# Patient Record
Sex: Male | Born: 1975 | Race: White | Hispanic: No | Smoking: Never smoker
Health system: Southern US, Community
[De-identification: ages and names within clinical notes are randomized; demographics above are authoritative.]

## PROBLEM LIST (undated history)

## (undated) DIAGNOSIS — C61 Malignant neoplasm of prostate: Secondary | ICD-10-CM

## (undated) DIAGNOSIS — Z8619 Personal history of other infectious and parasitic diseases: Secondary | ICD-10-CM

## (undated) DIAGNOSIS — K219 Gastro-esophageal reflux disease without esophagitis: Secondary | ICD-10-CM

## (undated) DIAGNOSIS — I10 Essential (primary) hypertension: Secondary | ICD-10-CM

## (undated) DIAGNOSIS — K279 Peptic ulcer, site unspecified, unspecified as acute or chronic, without hemorrhage or perforation: Secondary | ICD-10-CM

## (undated) DIAGNOSIS — F419 Anxiety disorder, unspecified: Secondary | ICD-10-CM

## (undated) DIAGNOSIS — I340 Nonrheumatic mitral (valve) insufficiency: Secondary | ICD-10-CM

## (undated) DIAGNOSIS — R011 Cardiac murmur, unspecified: Secondary | ICD-10-CM

## (undated) DIAGNOSIS — K5900 Constipation, unspecified: Secondary | ICD-10-CM

## (undated) DIAGNOSIS — N200 Calculus of kidney: Secondary | ICD-10-CM

## (undated) HISTORY — PX: LITHOTRIPSY: SUR834

## (undated) HISTORY — DX: Gastro-esophageal reflux disease without esophagitis: K21.9

## (undated) HISTORY — DX: Essential (primary) hypertension: I10

## (undated) HISTORY — DX: Cardiac murmur, unspecified: R01.1

## (undated) HISTORY — DX: Calculus of kidney: N20.0

## (undated) HISTORY — DX: Constipation, unspecified: K59.00

## (undated) HISTORY — DX: Anxiety disorder, unspecified: F41.9

## (undated) HISTORY — PX: HERNIA REPAIR: SHX51

## (undated) HISTORY — DX: Personal history of other infectious and parasitic diseases: Z86.19

## (undated) HISTORY — DX: Peptic ulcer, site unspecified, unspecified as acute or chronic, without hemorrhage or perforation: K27.9

---

## 2006-07-03 ENCOUNTER — Emergency Department (HOSPITAL_COMMUNITY): Admission: EM | Admit: 2006-07-03 | Discharge: 2006-07-03 | Payer: Self-pay | Admitting: Family Medicine

## 2008-01-28 ENCOUNTER — Other Ambulatory Visit: Payer: Self-pay

## 2008-01-28 ENCOUNTER — Emergency Department: Payer: Self-pay | Admitting: Emergency Medicine

## 2008-02-15 ENCOUNTER — Ambulatory Visit: Payer: Self-pay | Admitting: Anesthesiology

## 2008-03-22 ENCOUNTER — Ambulatory Visit: Payer: Self-pay | Admitting: Anesthesiology

## 2008-06-06 ENCOUNTER — Ambulatory Visit: Payer: Self-pay | Admitting: Anesthesiology

## 2009-09-01 ENCOUNTER — Emergency Department: Payer: Self-pay | Admitting: Emergency Medicine

## 2010-07-29 ENCOUNTER — Encounter: Payer: Self-pay | Admitting: Specialist

## 2011-01-25 ENCOUNTER — Emergency Department: Payer: Self-pay | Admitting: Emergency Medicine

## 2011-03-16 ENCOUNTER — Emergency Department: Payer: Self-pay | Admitting: Unknown Physician Specialty

## 2011-04-09 ENCOUNTER — Emergency Department: Payer: Self-pay | Admitting: Emergency Medicine

## 2011-04-10 ENCOUNTER — Emergency Department: Payer: Self-pay

## 2011-05-15 ENCOUNTER — Emergency Department: Payer: Self-pay

## 2011-10-20 ENCOUNTER — Emergency Department (HOSPITAL_COMMUNITY)
Admission: EM | Admit: 2011-10-20 | Discharge: 2011-10-20 | Disposition: A | Discharge status: Home / Self Care | Payer: Self-pay | Attending: Emergency Medicine | Admitting: Emergency Medicine

## 2011-10-20 ENCOUNTER — Emergency Department: Payer: Self-pay | Admitting: Emergency Medicine

## 2011-10-20 DIAGNOSIS — L03317 Cellulitis of buttock: Secondary | ICD-10-CM | POA: Insufficient documentation

## 2011-10-20 DIAGNOSIS — L0231 Cutaneous abscess of buttock: Secondary | ICD-10-CM | POA: Insufficient documentation

## 2011-10-20 DIAGNOSIS — Z8614 Personal history of Methicillin resistant Staphylococcus aureus infection: Secondary | ICD-10-CM | POA: Insufficient documentation
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
## 2013-07-26 ENCOUNTER — Encounter: Payer: Self-pay | Admitting: Gastroenterology

## 2013-08-20 ENCOUNTER — Ambulatory Visit (INDEPENDENT_AMBULATORY_CARE_PROVIDER_SITE_OTHER): Payer: 59 | Admitting: Gastroenterology

## 2013-08-20 ENCOUNTER — Other Ambulatory Visit (INDEPENDENT_AMBULATORY_CARE_PROVIDER_SITE_OTHER): Payer: 59

## 2013-08-20 ENCOUNTER — Encounter: Payer: Self-pay | Admitting: Gastroenterology

## 2013-08-20 VITALS — BP 148/86 | HR 72 | Ht 66.0 in | Wt 194.0 lb

## 2013-08-20 DIAGNOSIS — K5909 Other constipation: Secondary | ICD-10-CM | POA: Insufficient documentation

## 2013-08-20 DIAGNOSIS — K5900 Constipation, unspecified: Secondary | ICD-10-CM

## 2013-08-20 LAB — TSH: TSH: 0.56 u[IU]/mL (ref 0.35–5.50)

## 2013-08-20 LAB — T4: T4 TOTAL: 9.1 ug/dL (ref 5.0–12.5)

## 2013-08-20 MED ORDER — PEG-KCL-NACL-NASULF-NA ASC-C 100 G PO SOLR: 1.0000 | Freq: Once | ORAL | Status: DC

## 2013-08-20 NOTE — Addendum Note (Signed)
Addended by: Oda Kilts on: 08/20/2013 03:32 PM   Modules accepted: Orders

## 2013-08-20 NOTE — Progress Notes (Signed)
    _                                                                                                                History of Present Illness: 38 year old white male referred for evaluation of constipation.  Until 6 months ago he was moving his bowels every other day.  He now will not have a spontaneous bowel movement for 2-3 weeks at a time.  He is taking various laxatives with only a partial response.  He complains of fullness and early satiety.  At the same time he's gained over 20 pounds in the past 4 months.  There is no history of rectal bleeding.  He believes stools have become flat. He recalls getting laxatives as a child although he denies having constipation.  Energy levels have not changed.  He is otherwise feeling well.    Past Medical History  Diagnosis Date  . Kidney stones   . Hypertension    Past Surgical History  Procedure Laterality Date  . Hernia repair      x 2   . Lithotripsy     family history is not on file. Current Outpatient Prescriptions  Medication Sig Dispense Refill  . glycopyrrolate (ROBINUL) 1 MG tablet Take 1 mg by mouth 3 (three) times daily.       No current facility-administered medications for this visit.   Allergies as of 08/20/2013  . (No Known Allergies)    reports that he has never smoked. He uses smokeless tobacco. He reports that he does not drink alcohol or use illicit drugs.     Review of Systems: Pertinent positive and negative review of systems were noted in the above HPI section. All other review of systems were otherwise negative.  Vital signs were reviewed in today's medical record Physical Exam: General: Well developed , well nourished, no acute distress Skin: anicteric Head: Normocephalic and atraumatic Eyes:  sclerae anicteric, EOMI Ears: Normal auditory acuity Mouth: No deformity or lesions Neck: Supple, no masses or thyromegaly Lungs: Clear throughout to auscultation Heart: Regular rate and rhythm; no  murmurs, rubs or bruits Abdomen: Soft, non tender and non distended. No masses, hepatosplenomegaly or hernias noted. Normal Bowel sounds Rectal:deferred Musculoskeletal: Symmetrical with no gross deformities  Skin: No lesions on visible extremities Pulses:  Normal pulses noted Extremities: No clubbing, cyanosis, edema or deformities noted Neurological: Alert oriented x 4, grossly nonfocal Cervical Nodes:  No significant cervical adenopathy Inguinal Nodes: No significant inguinal adenopathy Psychological:  Alert and cooperative. Normal mood and affect  See Assessment and Plan under Problem List

## 2013-08-20 NOTE — Assessment & Plan Note (Signed)
The patient has new onset of severe constipation and concurrent weight gain.  Hypothyroidism should be ruled out.  A structural abnormality of the colon is also a concern.  At the same time, he does relate a history of having received laxatives as a child raiseing the question of lifelong idiopathic constipation.  Recommendations #1 check thyroid function tests #2 colonoscopy if #1 is normal #3 provided numbers one and 2 are unremarkable she will be referred for a constipation trial placed on a bowel regimen beginning with Linzess.

## 2013-08-20 NOTE — Patient Instructions (Signed)
Go to the basement for labs Use 1 bottle of mag Citrait and 2 dulcolax pills tonight if no results in 4 hours repeat steps  You have been scheduled for a colonoscopy with propofol. Please follow written instructions given to you at your visit today.  Please pick up your prep kit at the pharmacy within the next 1-3 days. If you use inhalers (even only as needed), please bring them with you on the day of your procedure. Your physician has requested that you go to www.startemmi.com and enter the access code given to you at your visit today. This web site gives a general overview about your procedure. However, you should still follow specific instructions given to you by our office regarding your preparation for the procedure.

## 2013-08-23 NOTE — Progress Notes (Signed)
Quick Note:  Please inform the patient that lab work was normal and to continue current plan of action ______ 

## 2013-08-24 ENCOUNTER — Encounter: Payer: Self-pay | Admitting: Gastroenterology

## 2013-09-10 ENCOUNTER — Ambulatory Visit (AMBULATORY_SURGERY_CENTER): Payer: 59 | Admitting: Gastroenterology

## 2013-09-10 ENCOUNTER — Encounter: Payer: Self-pay | Admitting: Gastroenterology

## 2013-09-10 VITALS — BP 133/84 | HR 74 | Temp 98.3°F | Resp 14 | Ht 66.0 in | Wt 194.0 lb

## 2013-09-10 DIAGNOSIS — K5900 Constipation, unspecified: Secondary | ICD-10-CM

## 2013-09-10 MED ORDER — SODIUM CHLORIDE 0.9 % IV SOLN: 500.0000 mL | INTRAVENOUS | Status: DC

## 2013-09-10 NOTE — Patient Instructions (Signed)
YOU HAD AN ENDOSCOPIC PROCEDURE TODAY AT THE [facility]: Refer to the procedure report that was given to you for any specific questions about what was found during the examination.  If the procedure report does not answer your questions, please call your gastroenterologist to clarify.  If you requested that your care partner not be given the details of your procedure findings, then the procedure report has been included in a sealed envelope for you to review at your convenience later.  YOU SHOULD EXPECT: Some feelings of bloating in the abdomen. Passage of more gas than usual.  Walking can help get rid of the air that was put into your GI tract during the procedure and reduce the bloating. If you had a lower endoscopy (such as a colonoscopy or flexible sigmoidoscopy) you may notice spotting of blood in your stool or on the toilet paper. If you underwent a bowel prep for your procedure, then you may not have a normal bowel movement for a few days.  DIET: Your first meal following the procedure should be a light meal and then it is ok to progress to your normal diet.  A half-sandwich or bowl of soup is an example of a good first meal.  Heavy or fried foods are harder to digest and may make you feel nauseous or bloated.  Likewise meals heavy in dairy and vegetables can cause extra gas to form and this can also increase the bloating.  Drink plenty of fluids but you should avoid alcoholic beverages for 24 hours.  ACTIVITY: Your care partner should take you home directly after the procedure.  You should plan to take it easy, moving slowly for the rest of the day.  You can resume normal activity the day after the procedure however you should NOT DRIVE or use heavy machinery for 24 hours (because of the sedation medicines used during the test).    SYMPTOMS TO REPORT IMMEDIATELY: A gastroenterologist can be reached at any hour.  During normal business hours, 8:30 AM to 5:00 PM Monday through Friday,  call [phone].  After hours and on weekends, please call the GI answering service at [phone] who will take a message and have the physician on call contact you.   Following lower endoscopy (colonoscopy or flexible sigmoidoscopy):  Excessive amounts of blood in the stool  Significant tenderness or worsening of abdominal pains  Swelling of the abdomen that is new, acute  Fever of 100F or higher  FOLLOW UP: If any biopsies were taken you will be contacted by phone or by letter within the next 1-3 weeks.  Call your gastroenterologist if you have not heard about the biopsies in 3 weeks.  Our staff will call the home number listed on your records the next business day following your procedure to check on you and address any questions or concerns that you may have at that time regarding the information given to you following your procedure. This is a courtesy call and so if there is no answer at the home number and we have not heard from you through the emergency physician on call, we will assume that you have returned to your regular daily activities without incident.  SIGNATURES/CONFIDENTIALITY: You and/or your care partner have signed paperwork which will be entered into your electronic medical record.  These signatures attest to the fact that that the information above on your After Visit Summary has been reviewed and is understood.  Full responsibility of the confidentiality of this   discharge information lies with you and/or your care-partner.  Recommendations See procedure report  

## 2013-09-10 NOTE — Progress Notes (Signed)
1606 patient in PACU, report given to pacu RN, vss, bbs=clear

## 2013-09-10 NOTE — Op Note (Signed)
[facility]  [address] [place], [address]   COLONOSCOPY PROCEDURE REPORT  PATIENT: Stephen Avila, Stephen Avila  MR#: [id number] BIRTHDATE: 1976/02/12 , 38  yrs. old GENDER: Male ENDOSCOPIST: Inda Castle, MD REFERRED DE:YCXK Hilma Favors, M.D. PROCEDURE DATE:  09/10/2013 PROCEDURE:   Colonoscopy, diagnostic First Screening Colonoscopy - Avg.  risk and is 50 yrs.  old or older Yes.  Prior Negative Screening - Now for repeat screening. N/A  History of Adenoma - Now for follow-up colonoscopy & has been > or = to 3 yrs.  N/A  Polyps Removed Today? No.  Recommend repeat exam, <10 yrs? No. ASA CLASS:   Class II INDICATIONS:Constipation. MEDICATIONS: MAC sedation, administered by CRNA and propofol (Diprivan) 350mg  IV  DESCRIPTION OF PROCEDURE:   After the risks benefits and alternatives of the procedure were thoroughly explained, informed consent was obtained.  A digital rectal exam revealed no abnormalities of the rectum.   The LB [id number]  endoscope was introduced through the anus and advanced to the cecum, which was identified by both the appendix and ileocecal valve. No adverse events experienced.   The quality of the prep was Suprep fair  The instrument was then slowly withdrawn as the colon was fully examined.      COLON FINDINGS: A normal appearing cecum, ileocecal valve, and appendiceal orifice were identified.  The ascending, hepatic flexure, transverse, splenic flexure, descending, sigmoid colon and rectum appeared unremarkable.  No polyps or cancers were seen. Retroflexed views revealed no abnormalities. The time to cecum=6 minutes 02 seconds.  Withdrawal time=9 minutes 45 seconds.  The scope was withdrawn and the procedure completed. COMPLICATIONS: There were no complications.  ENDOSCOPIC IMPRESSION: Normal colon  RECOMMENDATIONS: patient will consider enrollment in a chronic idiopathic constipation trial  eSigned:  Inda Castle, MD  09/10/2013 4:08 PM   cc:   PATIENT NAME:  Stephen Avila, Stephen Avila MR#: [id number]

## 2013-09-10 NOTE — Progress Notes (Signed)
Patient ID: Stephen Avila, male   DOB: July 20, 1975, 38 y.o.   MRN: [id number]

## 2013-09-13 ENCOUNTER — Telehealth: Payer: Self-pay

## 2013-09-13 NOTE — Telephone Encounter (Signed)
Left a message for the pt to call us if he has any questions or concerns. maw

## 2013-10-12 ENCOUNTER — Other Ambulatory Visit (HOSPITAL_COMMUNITY): Payer: Self-pay | Admitting: Family Medicine

## 2013-10-12 DIAGNOSIS — R030 Elevated blood-pressure reading, without diagnosis of hypertension: Secondary | ICD-10-CM

## 2013-10-14 ENCOUNTER — Other Ambulatory Visit (HOSPITAL_COMMUNITY): Payer: 59

## 2013-10-15 ENCOUNTER — Ambulatory Visit (HOSPITAL_COMMUNITY)
Admission: RE | Admit: 2013-10-15 | Discharge: 2013-10-15 | Disposition: A | Discharge status: Home / Self Care | Payer: 59 | Source: Ambulatory Visit | Attending: Family Medicine | Admitting: Family Medicine

## 2013-10-15 DIAGNOSIS — N200 Calculus of kidney: Secondary | ICD-10-CM | POA: Insufficient documentation

## 2013-10-15 DIAGNOSIS — R141 Gas pain: Secondary | ICD-10-CM | POA: Insufficient documentation

## 2013-10-15 DIAGNOSIS — R143 Flatulence: Secondary | ICD-10-CM

## 2013-10-15 DIAGNOSIS — N1330 Unspecified hydronephrosis: Secondary | ICD-10-CM | POA: Insufficient documentation

## 2013-10-15 DIAGNOSIS — M47817 Spondylosis without myelopathy or radiculopathy, lumbosacral region: Secondary | ICD-10-CM | POA: Insufficient documentation

## 2013-10-15 DIAGNOSIS — R1084 Generalized abdominal pain: Secondary | ICD-10-CM | POA: Insufficient documentation

## 2013-10-15 DIAGNOSIS — R142 Eructation: Secondary | ICD-10-CM | POA: Insufficient documentation

## 2013-10-15 DIAGNOSIS — R030 Elevated blood-pressure reading, without diagnosis of hypertension: Secondary | ICD-10-CM

## 2013-10-15 MED ORDER — IOHEXOL 300 MG/ML  SOLN
100.0000 mL | Freq: Once | INTRAMUSCULAR | Status: AC | PRN
  Administered 2013-10-15: 100 mL via INTRAVENOUS

## 2013-12-09 ENCOUNTER — Ambulatory Visit: Payer: Self-pay | Admitting: Urology

## 2013-12-09 DIAGNOSIS — Z87442 Personal history of urinary calculi: Secondary | ICD-10-CM | POA: Insufficient documentation

## 2013-12-09 DIAGNOSIS — N200 Calculus of kidney: Secondary | ICD-10-CM | POA: Insufficient documentation

## 2013-12-29 ENCOUNTER — Ambulatory Visit: Payer: Self-pay | Admitting: Urology

## 2013-12-31 LAB — URINE CULTURE

## 2014-01-05 ENCOUNTER — Ambulatory Visit: Payer: Self-pay | Admitting: Urology

## 2014-01-10 ENCOUNTER — Ambulatory Visit: Payer: Self-pay | Admitting: Urology

## 2014-01-11 ENCOUNTER — Telehealth: Payer: Self-pay

## 2014-01-11 ENCOUNTER — Other Ambulatory Visit: Payer: Self-pay

## 2014-01-11 MED ORDER — LINACLOTIDE 145 MCG PO CAPS: 145.0000 ug | ORAL_CAPSULE | Freq: Every day | ORAL | Status: DC

## 2014-01-11 NOTE — Telephone Encounter (Signed)
Script sent to pharmacy. Pt scheduled to see Dr. Deatra Ina 03/17/14@9 :30am. Pt mailed appt letter.

## 2014-01-11 NOTE — Telephone Encounter (Signed)
Message copied by Algernon Huxley on Tue Jan 11, 2014  3:17 PM ------      Message from: Erskine Emery D      Created: Tue Jan 11, 2014  9:28 AM      Regarding: FW: Miley,Stella       Begin LInzess 145ug qd      OV 2 months      ----- Message -----         From: Maury Dus, RN         Sent: 01/10/2014   2:48 PM           To: Inda Castle, MD      Subject: Melton AlarVanetta Shawl                                                   ----- Message -----         From: Orlie Pollen         Sent: 01/10/2014   2:35 PM           To: Maury Dus, RN      Subject: geovany, trudo                                           The above subject has completed the Linaclotide study. Please advise to a regimen treatment for his constipation.       ------

## 2014-01-20 ENCOUNTER — Ambulatory Visit: Payer: Self-pay | Admitting: Urology

## 2014-01-24 ENCOUNTER — Encounter: Payer: Self-pay | Admitting: *Deleted

## 2014-01-24 ENCOUNTER — Ambulatory Visit: Payer: Self-pay | Admitting: Urology

## 2014-01-24 ENCOUNTER — Telehealth: Payer: Self-pay | Admitting: Gastroenterology

## 2014-01-24 NOTE — Telephone Encounter (Signed)
Medication approved till 01/30/2024

## 2014-03-17 ENCOUNTER — Ambulatory Visit: Payer: 59 | Admitting: Gastroenterology

## 2014-10-29 NOTE — Op Note (Signed)
PATIENT NAME:  Stephen Avila, Stephen Avila MR#:  [id number] DATE OF BIRTH:  [birth year]  DATE OF PROCEDURE:  01/05/2014  PREOPERATIVE DIAGNOSIS: Right renal pelvic calculus.   POSTOPERATIVE DIAGNOSIS: Right renal pelvic calculus.   PROCEDURES:  1.  Right ureteropyeloscopy with Holmium laser lithotripsy.  2.  Placement right ureteral stent.   SURGEON: Scott C. Bernardo Heater, M.D.   ASSISTANT: None.   ANESTHESIA: General.   INDICATION: The patient is a 39 year old male with a history of recurrent stone disease. He recently developed right flank pain and CT was remarkable for a 19-mm right renal pelvic calculus. After discussion of treatment options, he has elected ureteroscopy with laser lithotripsy.   DESCRIPTION OF PROCEDURE:  The patient was seen in the staging area and the site was marked. He was taken to the cystoscopy suite and placed on the table in supine position where a general anesthetic was administered via an endotracheal tube. He was then placed in the low lithotomy position and his external genitalia were prepped and draped in the usual fashion. A  timeout was performed per protocol. The meatus would not accept a 21-French cystoscope and was dilated with [employer] sounds from 20 to 24 [place]. A 21-French lubricated cystoscope was then passed under direct vision. The urethra was normal in caliber without stricture. The prostate was nonocclusive. Bladder mucosa was inspected and there was no erythema, no solid or papillary lesions. The ureteral orifices were normal-appearing bilaterally.   A 0.035 PTFE guidewire was placed through the cystoscope into the right ureteral orifice. Wire was easily passed up into the renal pelvis under fluoroscopic guidance. The renal pelvic calculus was easily visualized on fluoroscopy. Cystoscope was removed and repassed. A second 0.737 hydrophilic guidewire was placed in a similar fashion into the right ureter. The cystoscope was removed. An AUR-8 flexible digital  ureteroscope was then placed over the hydrophilic guidewire, however, the scope could not be advanced up the distal ureter. The ureteroscope was removed. A long ureteral access sheath placed over the wire and placed into the distal ureter without difficulty. The inner sheath and hydrophilic guidewire were removed.   The 8-French flexible cystoscope was then placed through the access sheath and easily passed up the ureter to the renal pelvis. No ureteral abnormalities or calculi were seen. The stone was visualized in the renal pelvis and was crystalline-appearing. A 225-micron Holmium laser fiber was placed through the ureteroscope. The stone was then dusted into multiple minute fragments. Based on the size of the stone this portion of the procedure took some time.   Ureteroscopy was then performed. There were a few larger fragments which were removed with a 3-French Nitinol basket. The remaining small fragments were felt to be small enough to pass. No large fragments were seen on fluoroscopy. Retrograde pyelogram was performed. There was no extravasation and no larger fragments were visualized. The hydrophilic guidewire was replaced into the access sheath and the sheath was removed. A 6-French/24-cm Contour ureteral stent was placed. There was good curl seen in the renal pelvis. The cystoscope was repassed and the distal end of the stent was well positioned in the bladder. The safety wire was removed. The bladder was emptied and the cystoscope was removed. A B and O suppository was placed per rectum. The patient was taken to PACU in stable condition. There were no complications.   ESTIMATED BLOOD LOSS: Minimal.   ____________________________ Ronda Fairly. Bernardo Heater, MD scs:lt D: 01/05/2014 09:39:03 ET T: 01/05/2014 12:18:18 ET JOB#: [id number]  cc: Nicki Reaper  Mariana Arn, MD, <Dictator> Abbie Sons MD ELECTRONICALLY SIGNED 01/19/2014 22:16

## 2015-06-10 ENCOUNTER — Emergency Department: Payer: 59

## 2015-06-10 ENCOUNTER — Emergency Department
Admission: EM | Admit: 2015-06-10 | Discharge: 2015-06-11 | Disposition: A | Discharge status: Home / Self Care | Payer: 59 | Attending: Emergency Medicine | Admitting: Emergency Medicine

## 2015-06-10 DIAGNOSIS — R109 Unspecified abdominal pain: Secondary | ICD-10-CM

## 2015-06-10 DIAGNOSIS — Z79899 Other long term (current) drug therapy: Secondary | ICD-10-CM | POA: Diagnosis not present

## 2015-06-10 DIAGNOSIS — R1011 Right upper quadrant pain: Secondary | ICD-10-CM | POA: Diagnosis not present

## 2015-06-10 DIAGNOSIS — I10 Essential (primary) hypertension: Secondary | ICD-10-CM | POA: Diagnosis not present

## 2015-06-10 DIAGNOSIS — R1013 Epigastric pain: Secondary | ICD-10-CM | POA: Diagnosis not present

## 2015-06-10 LAB — COMPREHENSIVE METABOLIC PANEL
ALBUMIN: 4.5 g/dL (ref 3.5–5.0)
ALT: 26 U/L (ref 17–63)
AST: 24 U/L (ref 15–41)
Alkaline Phosphatase: 70 U/L (ref 38–126)
Anion gap: 9 (ref 5–15)
BUN: 16 mg/dL (ref 6–20)
CHLORIDE: 102 mmol/L (ref 101–111)
CO2: 30 mmol/L (ref 22–32)
Calcium: 9.7 mg/dL (ref 8.9–10.3)
Creatinine, Ser: 1.16 mg/dL (ref 0.61–1.24)
GFR calc Af Amer: >60 mL/min (ref >60)
GFR calc non Af Amer: >60 mL/min (ref >60)
GLUCOSE: 115 mg/dL — AB (ref 65–99)
POTASSIUM: 3.8 mmol/L (ref 3.5–5.1)
SODIUM: 141 mmol/L (ref 135–145)
Total Bilirubin: 0.1 mg/dL — ABNORMAL LOW (ref 0.3–1.2)
Total Protein: 8.1 g/dL (ref 6.5–8.1)

## 2015-06-10 LAB — CBC
HEMATOCRIT: 51.2 % (ref 40.0–52.0)
Hemoglobin: 17 g/dL (ref 13.0–18.0)
MCH: 28.9 pg (ref 26.0–34.0)
MCHC: 33.1 g/dL (ref 32.0–36.0)
MCV: 87.4 fL (ref 80.0–100.0)
Platelets: 316 10*3/uL (ref 150–440)
RBC: 5.86 MIL/uL (ref 4.40–5.90)
RDW: 13.1 % (ref 11.5–14.5)
WBC: 10 10*3/uL (ref 3.8–10.6)

## 2015-06-10 LAB — URINALYSIS COMPLETE WITH MICROSCOPIC (ARMC ONLY)
BACTERIA UA: NONE SEEN
Bilirubin Urine: NEGATIVE
GLUCOSE, UA: 50 mg/dL — AB
Ketones, ur: NEGATIVE mg/dL
LEUKOCYTES UA: NEGATIVE
Nitrite: NEGATIVE
PROTEIN: NEGATIVE mg/dL
SQUAMOUS EPITHELIAL / LPF: NONE SEEN
Specific Gravity, Urine: 1.013 (ref 1.005–1.030)
pH: 7 (ref 5.0–8.0)

## 2015-06-10 LAB — LIPASE, BLOOD: LIPASE: 38 U/L (ref 11–51)

## 2015-06-10 MED ORDER — ONDANSETRON HCL 4 MG PO TABS: 4.0000 mg | ORAL_TABLET | Freq: Three times a day (TID) | ORAL | Status: DC | PRN

## 2015-06-10 MED ORDER — SODIUM CHLORIDE 0.9 % IV BOLUS (SEPSIS)
1000.0000 mL | Freq: Once | INTRAVENOUS | Status: AC
  Administered 2015-06-10: 1000 mL via INTRAVENOUS

## 2015-06-10 MED ORDER — OXYCODONE-ACETAMINOPHEN 5-325 MG PO TABS: 1.0000 | ORAL_TABLET | Freq: Four times a day (QID) | ORAL | Status: DC | PRN

## 2015-06-10 NOTE — ED Notes (Signed)
Patient transported to CT 

## 2015-06-10 NOTE — ED Notes (Signed)
Awaiting room at this time.

## 2015-06-10 NOTE — Discharge Instructions (Signed)
You would prefer not to be admitted to the hospital at this time which is not unreasonable however if you have increased pain, fever, chest pain, shortness of breath, vomiting or you feel worse in any way please return to the emergency department. It is likely you will need to have her gallbladder out, however, that does not need to happen necessarily today but if you feel worse you do need to return to the emergency department. Otherwise, follow closely with the referral surgeon as an outpatient

## 2015-06-10 NOTE — ED Provider Notes (Addendum)
[facility] Emergency Department Provider Note  ____________________________________________   I have reviewed the triage vital signs and the nursing notes.   HISTORY  Chief Complaint Abdominal Pain    HPI Stephen Avila is a 39 y.o. male with a history of kidney stones, lithotripsy last year presents a with right upper quadrant pain that radiates to the right flank. His been there for one week. No nausea no vomiting eating well no diarrhea, feels constipated. No fever no chills no hematuria no dysuria no headache no cough no chest pain or shortness of breath. The pain is in the right side of his abdomen to the right back per his kidney stones used to be. Patient has no chest pain or shortness of breath or exertional symptoms no numbness or weakness. Pain is colicky, comes and goes,  Past Medical History  Diagnosis Date  . Kidney stones   . Hypertension   . Anxiety   . GERD (gastroesophageal reflux disease)   . Constipation   . Kidney stones     Patient Active Problem List   Diagnosis Date Noted  . Unspecified constipation 08/20/2013    Past Surgical History  Procedure Laterality Date  . Hernia repair      x 2   . Lithotripsy      Current Outpatient Rx  Name  Route  Sig  Dispense  Refill  . glycopyrrolate (ROBINUL) 1 MG tablet   Oral   Take 1 mg by mouth 3 (three) times daily.         Marland Kitchen Linaclotide (LINZESS) 145 MCG CAPS capsule   Oral   Take 1 capsule (145 mcg total) by mouth daily.   30 capsule   2     Allergies Review of patient's allergies indicates no known allergies.  No family history on file.  Social History Social History  Substance Use Topics  . Smoking status: Never Smoker   . Smokeless tobacco: Current User    Types: Snuff  . Alcohol Use: No    Review of Systems Constitutional: No fever/chills Eyes: No visual changes. ENT: No sore throat. No stiff neck no neck pain Cardiovascular: Denies chest  pain. Respiratory: Denies shortness of breath. Gastrointestinal:   no vomiting.  No diarrhea.  No constipation. Genitourinary: Negative for dysuria. Musculoskeletal: Negative lower extremity swelling Skin: Negative for rash. Neurological: Negative for headaches, focal weakness or numbness. 10-point ROS otherwise negative.  ____________________________________________   PHYSICAL EXAM:  VITAL SIGNS: ED Triage Vitals  Enc Vitals Group     BP 06/10/15 1547 136/101 mmHg     Pulse Rate 06/10/15 1547 122     Resp 06/10/15 1547 18     Temp 06/10/15 1547 97.8 F (36.6 C)     Temp Source 06/10/15 1547 Oral     SpO2 06/10/15 1547 98 %     Weight 06/10/15 1547 210 lb (95.255 kg)     Height 06/10/15 1547 5\' 6"  (1.676 m)     Head Cir --      Peak Flow --      Pain Score 06/10/15 1548 7     Pain Loc --      Pain Edu? --      Excl. in [place]? --     Constitutional: Alert and oriented. Well appearing and in no acute distress. Eyes: Conjunctivae are normal. PERRL. EOMI. Head: Atraumatic. Nose: No congestion/rhinnorhea. Mouth/Throat: Mucous membranes are moist.  Oropharynx non-erythematous. Neck: No stridor.   Nontender with no  meningismus Cardiovascular: Normal rate, regular rhythm. Grossly normal heart sounds.  Good peripheral circulation. Respiratory: Normal respiratory effort.  No retractions. Lungs CTAB. Abdominal: Minimal tenderness to palpation in the epigastric right upper quadrant region which does reduce the patient's pain. No distention. No guarding no rebound Back:  There is no focal tenderness or step off there is no midline tenderness there are no lesions noted. there is right CVA tenderness Musculoskeletal: No lower extremity tenderness. No joint effusions, no DVT signs strong distal pulses no edema Neurologic:  Normal speech and language. No gross focal neurologic deficits are appreciated.  Skin:  Skin is warm, dry and intact. No rash noted. Psychiatric: Mood and affect are  normal. Speech and behavior are normal.  ____________________________________________   LABS (all labs ordered are listed, but only abnormal results are displayed)  Labs Reviewed  COMPREHENSIVE METABOLIC PANEL - Abnormal; Notable for the following:    Glucose, Bld 115 (*)    Total Bilirubin 0.1 (*)    All other components within normal limits  URINALYSIS COMPLETEWITH MICROSCOPIC ([facility] ONLY) - Abnormal; Notable for the following:    Color, Urine YELLOW (*)    APPearance CLEAR (*)    Glucose, UA 50 (*)    Hgb urine dipstick 1+ (*)    All other components within normal limits  LIPASE, BLOOD  CBC   ____________________________________________  EKG  I personally interpreted any EKGs ordered by me or triage Sinus tach rate 107 acute ST elevation or acute ST depression, no significant change from prior in 2009 ____________________________________________  RADIOLOGY  I reviewed any imaging ordered by me or triage that were performed during my shift ____________________________________________   PROCEDURES  Procedure(s) performed: None  Critical Care performed: None  ____________________________________________   INITIAL IMPRESSION / ASSESSMENT AND PLAN / ED COURSE  Pertinent labs & imaging results that were available during my care of the patient were reviewed by me and considered in my medical decision making (see chart for details).  Patient with epigastric and right flank pain, history of kidney stones hypertension anxiety GERD constipation and lithotripsy. Nothing to suggest referred intrathoracic pathology for this pain. Does not seem consistent with CAD. Lipase is normal electrolytes are normal kidney function is normal liver function tests are reassuring CBC is reassuring urinalysis is reassuring exam is reassuring. Given his history of kidney stones and the fact that he has flank pain I did a CT scan of his abdomen and pelvis which was unremarkable. I will now obtain  an ultrasound of his right upper quadrant as a precaution is asked where the pain seems to localize however there is evidence of significant constipation to CT and that certainly can cause this colicky pain that he is describing.   ----------------------------------------- 8:51 PM on 06/10/2015 -----------------------------------------  Patient has evidence of biliary colic but no evidence of cholecystitis. This is consistent with exam and history.  ----------------------------------------- 10:01 PM on 06/10/2015 -----------------------------------------  Patient resting comfortably in the bed. Heart rate is 94 when he is alone but when I enter the room wearing a white coat, his heart rate goes up to 110 to 115. He states he is "been panicking" thing about what might be wrong with him. He is very reassured by our findings as MI. I have offered him a surgical consult but he prefers to decline that at this time. He has no abdominal pain at this time and serial abdominal exams are negative for significant tenderness. Patient has been explained that he likely ultimately  will have to have his gallbladder out given his symptom I'll G and his findings. The patient states that he would prefer to do this as an outpatient and go home. He understands return precautions and follow-up. Patient declines admission or further intervention. We will ensure that he can tolerate by mouth prior to discharge.  ____________________________________________   FINAL CLINICAL IMPRESSION(S) / ED DIAGNOSES  Final diagnoses:  Right flank pain  Abdominal pain     Schuyler Amor, MD 06/10/15 1947  Schuyler Amor, MD 06/10/15 2050  Schuyler Amor, MD 06/10/15 [id number]  Schuyler Amor, MD 06/10/15 2202

## 2015-06-10 NOTE — ED Notes (Signed)
Lab results reviewed and WNL.

## 2015-06-10 NOTE — ED Notes (Signed)
Pt c/o RUQ pain that radiates into the back since Wednesday.. Denies N/V/D.Marland Kitchen

## 2015-06-11 NOTE — ED Notes (Signed)
Pt dc home with wife instructed on follow up plan and med use PT NAD AT DC

## 2015-06-14 ENCOUNTER — Ambulatory Visit: Payer: Self-pay | Admitting: "Endocrinology

## 2015-06-15 ENCOUNTER — Telehealth: Payer: Self-pay

## 2015-06-15 NOTE — Telephone Encounter (Signed)
We received a disability form from [employer] on 06/14/2015. I called Wendi Maya, RN, CCM at [phone], [address] [phone] but had to leave a voicemail. I also faxed Wendi Maya a note letting her know that we have not seen this patient neither at the Emergency Room because he declined to be seen by one of our surgeons and at our office. However, patient does have an appointment with [place] for a consult on 06/23/2015 at 2:45 PM with Dr. Burt Knack.  If patient decides on doing surgery, then we will be able to fill out his short-term disability forms. This was explained via fax to Wendi Maya from [employer].

## 2015-06-20 NOTE — Patient Instructions (Signed)
Stephen Avila  06/20/2015     @PREFPERIOPPHARMACY @   Your procedure is scheduled on 06/26/15.  Report to [facility] at 10:50 A.M.  Call this number if you have problems the morning of surgery:  [phone]   Remember:  Do not eat food or drink liquids after midnight.  Take these medicines the morning of surgery with A SIP OF WATER Oxycodone, Zofran, Linzess   Do not wear jewelry, make-up or nail polish.  Do not wear lotions, powders, or perfumes.  You may wear deodorant.  Do not shave 48 hours prior to surgery.  Men may shave face and neck.  Do not bring valuables to the hospital.  [facility] is not responsible for any belongings or valuables.  Contacts, dentures or bridgework may not be worn into surgery.  Leave your suitcase in the car.  After surgery it may be brought to your room.  For patients admitted to the hospital, discharge time will be determined by your treatment team.  Patients discharged the day of surgery will not be allowed to drive home.    Please read over the following fact sheets that you were given. Surgical Site Infection Prevention and Anesthesia Post-op Instructions     PATIENT INSTRUCTIONS POST-ANESTHESIA  IMMEDIATELY FOLLOWING SURGERY:  Do not drive or operate machinery for the first twenty four hours after surgery.  Do not make any important decisions for twenty four hours after surgery or while taking narcotic pain medications or sedatives.  If you develop intractable nausea and vomiting or a severe headache please notify your doctor immediately.  FOLLOW-UP:  Please make an appointment with your surgeon as instructed. You do not need to follow up with anesthesia unless specifically instructed to do so.  WOUND CARE INSTRUCTIONS (if applicable):  Keep a dry clean dressing on the anesthesia/puncture wound site if there is drainage.  Once the wound has quit draining you may leave it open to air.  Generally you should leave the bandage intact  for twenty four hours unless there is drainage.  If the epidural site drains for more than 36-48 hours please call the anesthesia department.  QUESTIONS?:  Please feel free to call your physician or the hospital operator if you have any questions, and they will be happy to assist you.      Laparoscopic Cholecystectomy Laparoscopic cholecystectomy is surgery to remove the gallbladder. The gallbladder is located in the upper right part of the abdomen, behind the liver. It is a storage sac for bile, which is produced in the liver. Bile aids in the digestion and absorption of fats. Cholecystectomy is often done for inflammation of the gallbladder (cholecystitis). This condition is usually caused by a buildup of gallstones (cholelithiasis) in the gallbladder. Gallstones can block the flow of bile, and that can result in inflammation and pain. In severe cases, emergency surgery may be required. If emergency surgery is not required, you will have time to prepare for the procedure. Laparoscopic surgery is an alternative to open surgery. Laparoscopic surgery has a shorter recovery time. Your common bile duct may also need to be examined during the procedure. If stones are found in the common bile duct, they may be removed. LET [facility] CARE PROVIDER KNOW ABOUT:  Any allergies you have.  All medicines you are taking, including vitamins, herbs, eye drops, creams, and over-the-counter medicines.  Previous problems you or members of your family have had with the use of anesthetics.  Any blood disorders you have.  Previous surgeries you have had.  Any medical conditions you have. RISKS AND COMPLICATIONS Generally, this is a safe procedure. However, problems may occur, including:  Infection.  Bleeding.  Allergic reactions to medicines.  Damage to other structures or organs.  A stone remaining in the common bile duct.  A bile leak from the cyst duct that is clipped when your gallbladder is  removed.  The need to convert to open surgery, which requires a larger incision in the abdomen. This may be necessary if your surgeon thinks that it is not safe to continue with a laparoscopic procedure. BEFORE THE PROCEDURE  Ask your health care provider about:  Changing or stopping your regular medicines. This is especially important if you are taking diabetes medicines or blood thinners.  Taking medicines such as aspirin and ibuprofen. These medicines can thin your blood. Do not take these medicines before your procedure if your health care provider instructs you not to.  Follow instructions from your health care provider about eating or drinking restrictions.  Let your health care provider know if you develop a cold or an infection before surgery.  Plan to have someone take you home after the procedure.  Ask your health care provider how your surgical site will be marked or identified.  You may be given antibiotic medicine to help prevent infection. PROCEDURE  To reduce your risk of infection:  Your health care team will wash or sanitize their hands.  Your skin will be washed with soap.  An IV tube may be inserted into one of your veins.  You will be given a medicine to make you fall asleep (general anesthetic).  A breathing tube will be placed in your mouth.  The surgeon will make several small cuts (incisions) in your abdomen.  A thin, lighted tube (laparoscope) that has a tiny camera on the end will be inserted through one of the small incisions. The camera on the laparoscope will send a picture to a TV screen (monitor) in the operating room. This will give the surgeon a good view inside your abdomen.  A gas will be pumped into your abdomen. This will expand your abdomen to give the surgeon more room to perform the surgery.  Other tools that are needed for the procedure will be inserted through the other incisions. The gallbladder will be removed through one of the  incisions.  After your gallbladder has been removed, the incisions will be closed with stitches (sutures), staples, or skin glue.  Your incisions may be covered with a bandage (dressing). The procedure may vary among health care providers and hospitals. AFTER THE PROCEDURE  Your blood pressure, heart rate, breathing rate, and blood oxygen level will be monitored often until the medicines you were given have worn off.  You will be given medicines as needed to control your pain.   This information is not intended to replace advice given to you by your health care provider. Make sure you discuss any questions you have with your health care provider.   Document Released: 06/24/2005 Document Revised: 03/15/2015 Document Reviewed: 02/03/2013 Elsevier Interactive Patient Education [employer].

## 2015-06-21 ENCOUNTER — Encounter (HOSPITAL_COMMUNITY): Payer: Self-pay

## 2015-06-21 ENCOUNTER — Encounter (HOSPITAL_COMMUNITY)
Admission: RE | Admit: 2015-06-21 | Discharge: 2015-06-21 | Disposition: A | Discharge status: Home / Self Care | Payer: 59 | Source: Ambulatory Visit | Attending: General Surgery | Admitting: General Surgery

## 2015-06-21 DIAGNOSIS — K8020 Calculus of gallbladder without cholecystitis without obstruction: Secondary | ICD-10-CM | POA: Diagnosis not present

## 2015-06-21 DIAGNOSIS — Z01818 Encounter for other preprocedural examination: Secondary | ICD-10-CM | POA: Insufficient documentation

## 2015-06-21 LAB — BASIC METABOLIC PANEL
Anion gap: 9 (ref 5–15)
BUN: 23 mg/dL — AB (ref 6–20)
CALCIUM: 9.8 mg/dL (ref 8.9–10.3)
CHLORIDE: 97 mmol/L — AB (ref 101–111)
CO2: 31 mmol/L (ref 22–32)
CREATININE: 1.19 mg/dL (ref 0.61–1.24)
Glucose, Bld: 104 mg/dL — ABNORMAL HIGH (ref 65–99)
Potassium: 3.4 mmol/L — ABNORMAL LOW (ref 3.5–5.1)
Sodium: 137 mmol/L (ref 135–145)

## 2015-06-21 LAB — HEPATIC FUNCTION PANEL
ALBUMIN: 4.2 g/dL (ref 3.5–5.0)
ALT: 27 U/L (ref 17–63)
AST: 30 U/L (ref 15–41)
Alkaline Phosphatase: 71 U/L (ref 38–126)
BILIRUBIN TOTAL: 0.5 mg/dL (ref 0.3–1.2)
Bilirubin, Direct: 0.2 mg/dL (ref 0.1–0.5)
Indirect Bilirubin: 0.3 mg/dL (ref 0.3–0.9)
Total Protein: 7.4 g/dL (ref 6.5–8.1)

## 2015-06-21 LAB — SURGICAL PCR SCREEN
MRSA, PCR: NEGATIVE
STAPHYLOCOCCUS AUREUS: NEGATIVE

## 2015-06-21 NOTE — H&P (Signed)
  NTS SOAP Note  Vital Signs:  Vitals as of: 0000000: Systolic [id number]: Diastolic 0000000: Heart Rate [id number]: Temp 97.60F (Temporal): Height 65ft 6in: Weight 216Lbs 0.14 Ounces: Pain Level 6: BMI 34.86   BMI : 34.86 kg/m2  Subjective: This 39 year old male presents for of right upper quadrant abdominal pain.  Has been present intermittently for the past few weeks.  Pain radiates around the right flank to the back.  Tender to touch.  Some nausea present.  No fever, chills, jaundice.  U/S of gallbladder shows cholelithiasis, normal common bile duct.  Seen in ER.  Review of Symptoms:  Constitutional:fatigue Head:unremarkable Eyes:unremarkable   Nose/Mouth/Throat:unremarkable Cardiovascular:  unremarkable Respiratory:unremarkable Gastrointestinabdominal pain Genitourinary:unremarkable   Musculoskeletal:unremarkable Skin:unremarkable Hematolgic/Lymphatic:unremarkable   Allergic/Immunologic:unremarkable   Past Medical History:  Reviewed  Past Medical History  Surgical History: lithotripsy, herniorrhaphy x 2 Medical Problems: HTN Allergies: nkda Medications: HCTZ, linzess   Social History:Reviewed  Social History  Preferred Language: English Race:  White Ethnicity: Not Hispanic / Latino Age: 68 year Marital Status:  S Alcohol: no   Smoking Status: Never smoker reviewed on 06/20/2015 Functional Status reviewed on 06/20/2015 ------------------------------------------------ Bathing: Normal Cooking: Normal Dressing: Normal Driving: Normal Eating: Normal Managing Meds: Normal Oral Care: Normal Shopping: Normal Toileting: Normal Transferring: Normal Walking: Normal Cognitive Status reviewed on 06/20/2015 ------------------------------------------------ Attention: Normal Decision Making: Normal Language: Normal Memory: Normal Motor: Normal Perception: Normal Problem Solving: Normal Visual and Spatial: Normal   Family History:Reviewed  Family  Health History Mother, Living; Lung cancer;  Father, Living; Diabetes mellitus, unspecified type; Heart disease;     Objective Information: General:Well appearing, well nourished in no distress. Heart:RRR, no murmur or gallop.  Normal S1, S2.  No S3, S4.  Lungs:  CTA bilaterally, no wheezes, rhonchi, rales.  Breathing unlabored. Abdomen:Soft, tender in right upper quadrant to palpation, ND, normal bowel sounds, no HSM, no masses.  No peritoneal signs.  Assessment:Biliary colic, cholelithiasis  Diagnoses: 574.20  K80.20 Gallstone (Calculus of gallbladder without cholecystitis without obstruction)  Procedures: [id number] - OFFICE OUTPATIENT NEW 30 MINUTES    Plan:  Scheduled for laparoscopic cholecystectomy on 06/26/15.   Patient Education:Alternative treatments to surgery were discussed with patient (and family).  Risks and benefits  of procedure including bleeding, infection, hepatobiliary injury, and the possibility of an ppen procedure were fully explained to the patient (and family) who gave informed consent. Patient/family questions were addressed.  Follow-up:Pending Surgery

## 2015-06-21 NOTE — Pre-Procedure Instructions (Signed)
Patient states that he was diagnosed with MRSA four years ago, secondary to a boil.  PCR screen completed today.

## 2015-06-23 ENCOUNTER — Ambulatory Visit: Payer: Self-pay | Admitting: Surgery

## 2015-06-26 ENCOUNTER — Ambulatory Visit (HOSPITAL_COMMUNITY)
Admission: RE | Admit: 2015-06-26 | Discharge: 2015-06-26 | Disposition: A | Discharge status: Home / Self Care | Payer: 59 | Source: Ambulatory Visit | Attending: General Surgery | Admitting: General Surgery

## 2015-06-26 ENCOUNTER — Ambulatory Visit (HOSPITAL_COMMUNITY): Payer: 59 | Admitting: Anesthesiology

## 2015-06-26 ENCOUNTER — Encounter (HOSPITAL_COMMUNITY)
Admission: RE | Disposition: A | Discharge status: Home / Self Care | Payer: Self-pay | Source: Ambulatory Visit | Attending: General Surgery

## 2015-06-26 ENCOUNTER — Encounter (HOSPITAL_COMMUNITY): Payer: Self-pay | Admitting: *Deleted

## 2015-06-26 DIAGNOSIS — Z79899 Other long term (current) drug therapy: Secondary | ICD-10-CM | POA: Insufficient documentation

## 2015-06-26 DIAGNOSIS — K219 Gastro-esophageal reflux disease without esophagitis: Secondary | ICD-10-CM | POA: Diagnosis not present

## 2015-06-26 DIAGNOSIS — R000 Tachycardia, unspecified: Secondary | ICD-10-CM | POA: Diagnosis not present

## 2015-06-26 DIAGNOSIS — K8010 Calculus of gallbladder with chronic cholecystitis without obstruction: Secondary | ICD-10-CM | POA: Diagnosis not present

## 2015-06-26 DIAGNOSIS — F419 Anxiety disorder, unspecified: Secondary | ICD-10-CM | POA: Insufficient documentation

## 2015-06-26 DIAGNOSIS — Z87442 Personal history of urinary calculi: Secondary | ICD-10-CM | POA: Insufficient documentation

## 2015-06-26 DIAGNOSIS — I10 Essential (primary) hypertension: Secondary | ICD-10-CM | POA: Diagnosis not present

## 2015-06-26 DIAGNOSIS — K8020 Calculus of gallbladder without cholecystitis without obstruction: Secondary | ICD-10-CM | POA: Diagnosis present

## 2015-06-26 HISTORY — PX: CHOLECYSTECTOMY: SHX55

## 2015-06-26 SURGERY — LAPAROSCOPIC CHOLECYSTECTOMY
Anesthesia: General | Site: Abdomen

## 2015-06-26 MED ORDER — ROCURONIUM BROMIDE 50 MG/5ML IV SOLN
INTRAVENOUS | Status: AC
  Filled 2015-06-26: qty 1

## 2015-06-26 MED ORDER — LIDOCAINE HCL (PF) 1 % IJ SOLN
INTRAMUSCULAR | Status: AC
  Filled 2015-06-26: qty 5

## 2015-06-26 MED ORDER — POVIDONE-IODINE 10 % OINT PACKET
TOPICAL_OINTMENT | CUTANEOUS | Status: DC | PRN
  Administered 2015-06-26: 1 via TOPICAL

## 2015-06-26 MED ORDER — METOPROLOL TARTRATE 1 MG/ML IV SOLN
5.0000 mg | Freq: Once | INTRAVENOUS | Status: AC
  Administered 2015-06-26: 5 mg via INTRAVENOUS

## 2015-06-26 MED ORDER — ONDANSETRON HCL 4 MG/2ML IJ SOLN
INTRAMUSCULAR | Status: AC
  Filled 2015-06-26: qty 2

## 2015-06-26 MED ORDER — HEMOSTATIC AGENTS (NO CHARGE) OPTIME
TOPICAL | Status: DC | PRN
  Administered 2015-06-26: 2 via TOPICAL

## 2015-06-26 MED ORDER — OXYCODONE-ACETAMINOPHEN 7.5-325 MG PO TABS: 1.0000 | ORAL_TABLET | ORAL | Status: DC | PRN

## 2015-06-26 MED ORDER — METOPROLOL TARTRATE 1 MG/ML IV SOLN
INTRAVENOUS | Status: AC
  Filled 2015-06-26: qty 5

## 2015-06-26 MED ORDER — GLYCOPYRROLATE 0.2 MG/ML IJ SOLN
INTRAMUSCULAR | Status: AC
  Filled 2015-06-26: qty 3

## 2015-06-26 MED ORDER — GLYCOPYRROLATE 0.2 MG/ML IJ SOLN
INTRAMUSCULAR | Status: DC | PRN
  Administered 2015-06-26: 0.6 mg via INTRAVENOUS

## 2015-06-26 MED ORDER — POVIDONE-IODINE 10 % EX OINT
TOPICAL_OINTMENT | CUTANEOUS | Status: AC
  Filled 2015-06-26: qty 1

## 2015-06-26 MED ORDER — PROPOFOL 10 MG/ML IV BOLUS
INTRAVENOUS | Status: AC
  Filled 2015-06-26: qty 20

## 2015-06-26 MED ORDER — NEOSTIGMINE METHYLSULFATE 10 MG/10ML IV SOLN
INTRAVENOUS | Status: DC | PRN
  Administered 2015-06-26: 4 mg via INTRAVENOUS

## 2015-06-26 MED ORDER — LACTATED RINGERS IV SOLN
INTRAVENOUS | Status: DC | PRN
  Administered 2015-06-26 (×2): via INTRAVENOUS

## 2015-06-26 MED ORDER — LIDOCAINE HCL (CARDIAC) 20 MG/ML IV SOLN
INTRAVENOUS | Status: DC | PRN
  Administered 2015-06-26: 50 mg via INTRAVENOUS

## 2015-06-26 MED ORDER — 0.9 % SODIUM CHLORIDE (POUR BTL) OPTIME
TOPICAL | Status: DC | PRN
  Administered 2015-06-26: 1000 mL

## 2015-06-26 MED ORDER — CIPROFLOXACIN IN D5W 400 MG/200ML IV SOLN
400.0000 mg | INTRAVENOUS | Status: AC
  Administered 2015-06-26: 400 mg via INTRAVENOUS
  Filled 2015-06-26: qty 200

## 2015-06-26 MED ORDER — FENTANYL CITRATE (PF) 100 MCG/2ML IJ SOLN
INTRAMUSCULAR | Status: AC
  Filled 2015-06-26: qty 2

## 2015-06-26 MED ORDER — CHLORHEXIDINE GLUCONATE 4 % EX LIQD: 1.0000 "application " | Freq: Once | CUTANEOUS | Status: DC

## 2015-06-26 MED ORDER — ONDANSETRON HCL 4 MG/2ML IJ SOLN
4.0000 mg | Freq: Once | INTRAMUSCULAR | Status: AC
  Administered 2015-06-26: 4 mg via INTRAVENOUS

## 2015-06-26 MED ORDER — FENTANYL CITRATE (PF) 100 MCG/2ML IJ SOLN
INTRAMUSCULAR | Status: DC | PRN
Start: 2015-06-26 — End: 2015-06-26
  Administered 2015-06-26: 25 ug via INTRAVENOUS
  Administered 2015-06-26: 50 ug via INTRAVENOUS
  Administered 2015-06-26: 25 ug via INTRAVENOUS
  Administered 2015-06-26 (×2): 50 ug via INTRAVENOUS
  Administered 2015-06-26: 100 ug via INTRAVENOUS

## 2015-06-26 MED ORDER — ROCURONIUM BROMIDE 100 MG/10ML IV SOLN
INTRAVENOUS | Status: DC | PRN
  Administered 2015-06-26: 10 mg via INTRAVENOUS
  Administered 2015-06-26: 20 mg via INTRAVENOUS

## 2015-06-26 MED ORDER — MIDAZOLAM HCL 2 MG/2ML IJ SOLN
INTRAMUSCULAR | Status: AC
  Filled 2015-06-26: qty 2

## 2015-06-26 MED ORDER — BUPIVACAINE HCL (PF) 0.5 % IJ SOLN
INTRAMUSCULAR | Status: AC
  Filled 2015-06-26: qty 30

## 2015-06-26 MED ORDER — FENTANYL CITRATE (PF) 250 MCG/5ML IJ SOLN
INTRAMUSCULAR | Status: AC
  Filled 2015-06-26: qty 5

## 2015-06-26 MED ORDER — KETOROLAC TROMETHAMINE 30 MG/ML IJ SOLN
30.0000 mg | Freq: Once | INTRAMUSCULAR | Status: AC
  Administered 2015-06-26: 30 mg via INTRAVENOUS

## 2015-06-26 MED ORDER — FENTANYL CITRATE (PF) 100 MCG/2ML IJ SOLN
25.0000 ug | INTRAMUSCULAR | Status: DC | PRN
  Administered 2015-06-26 (×4): 50 ug via INTRAVENOUS

## 2015-06-26 MED ORDER — ETOMIDATE 2 MG/ML IV SOLN
INTRAVENOUS | Status: AC
  Filled 2015-06-26: qty 10

## 2015-06-26 MED ORDER — MIDAZOLAM HCL 2 MG/2ML IJ SOLN
1.0000 mg | INTRAMUSCULAR | Status: DC | PRN
  Administered 2015-06-26 (×2): 2 mg via INTRAVENOUS
  Filled 2015-06-26: qty 2

## 2015-06-26 MED ORDER — PROPOFOL 10 MG/ML IV BOLUS
INTRAVENOUS | Status: DC | PRN
  Administered 2015-06-26: 160 mg via INTRAVENOUS

## 2015-06-26 MED ORDER — ARTIFICIAL TEARS OP OINT
TOPICAL_OINTMENT | OPHTHALMIC | Status: AC
  Filled 2015-06-26: qty 7

## 2015-06-26 MED ORDER — LACTATED RINGERS IV SOLN
INTRAVENOUS | Status: DC
  Administered 2015-06-26: 10:00:00 via INTRAVENOUS

## 2015-06-26 MED ORDER — KETOROLAC TROMETHAMINE 30 MG/ML IJ SOLN
INTRAMUSCULAR | Status: AC
  Filled 2015-06-26: qty 1

## 2015-06-26 MED ORDER — METOPROLOL TARTRATE 1 MG/ML IV SOLN
INTRAVENOUS | Status: DC | PRN
Start: 2015-06-26 — End: 2015-06-26
  Administered 2015-06-26 (×2): 2.5 mg via INTRAVENOUS

## 2015-06-26 MED ORDER — BUPIVACAINE HCL (PF) 0.5 % IJ SOLN
INTRAMUSCULAR | Status: DC | PRN
  Administered 2015-06-26: 10 mL

## 2015-06-26 MED ORDER — ONDANSETRON HCL 4 MG/2ML IJ SOLN
4.0000 mg | Freq: Once | INTRAMUSCULAR | Status: AC | PRN
  Administered 2015-06-26: 4 mg via INTRAVENOUS

## 2015-06-26 MED ORDER — SUCCINYLCHOLINE CHLORIDE 20 MG/ML IJ SOLN
INTRAMUSCULAR | Status: DC | PRN
  Administered 2015-06-26: 140 mg via INTRAVENOUS

## 2015-06-26 SURGICAL SUPPLY — 43 items
APPLIER CLIP LAPSCP 10X32 DD (CLIP) ×2 IMPLANT
BAG HAMPER (MISCELLANEOUS) ×2 IMPLANT
CHLORAPREP W/TINT 26ML (MISCELLANEOUS) ×2 IMPLANT
CLOTH BEACON ORANGE TIMEOUT ST (SAFETY) ×2 IMPLANT
COVER LIGHT HANDLE STERIS (MISCELLANEOUS) ×4 IMPLANT
DECANTER SPIKE VIAL GLASS SM (MISCELLANEOUS) ×2 IMPLANT
ELECT REM PT RETURN 9FT ADLT (ELECTROSURGICAL) ×2
ELECTRODE REM PT RTRN 9FT ADLT (ELECTROSURGICAL) ×1 IMPLANT
FILTER SMOKE EVAC LAPAROSHD (FILTER) ×2 IMPLANT
FORMALIN 10 PREFIL 120ML (MISCELLANEOUS) ×2 IMPLANT
GLOVE BIO SURGEON STRL SZ7 (GLOVE) ×4 IMPLANT
GLOVE BIOGEL PI IND STRL 7.0 (GLOVE) ×4 IMPLANT
GLOVE BIOGEL PI INDICATOR 7.0 (GLOVE) ×4
GLOVE ECLIPSE 6.5 STRL STRAW (GLOVE) ×2 IMPLANT
GLOVE SURG SS PI 7.5 STRL IVOR (GLOVE) ×2 IMPLANT
GOWN STRL REUS W/ TWL XL LVL3 (GOWN DISPOSABLE) ×1 IMPLANT
GOWN STRL REUS W/TWL LRG LVL3 (GOWN DISPOSABLE) ×6 IMPLANT
GOWN STRL REUS W/TWL XL LVL3 (GOWN DISPOSABLE) ×1
HEMOSTAT SNOW SURGICEL 2X4 (HEMOSTASIS) ×4 IMPLANT
INST SET LAPROSCOPIC AP (KITS) ×2 IMPLANT
IV NS IRRIG 3000ML ARTHROMATIC (IV SOLUTION) IMPLANT
KIT ROOM TURNOVER APOR (KITS) ×2 IMPLANT
MANIFOLD NEPTUNE II (INSTRUMENTS) ×2 IMPLANT
NEEDLE INSUFFLATION 14GA 120MM (NEEDLE) ×2 IMPLANT
NS IRRIG 1000ML POUR BTL (IV SOLUTION) ×2 IMPLANT
PACK LAP CHOLE LZT030E (CUSTOM PROCEDURE TRAY) ×2 IMPLANT
PAD ARMBOARD 7.5X6 YLW CONV (MISCELLANEOUS) ×2 IMPLANT
PENCIL HANDSWITCHING (ELECTRODE) ×2 IMPLANT
POUCH SPECIMEN RETRIEVAL 10MM (ENDOMECHANICALS) ×2 IMPLANT
SET BASIN LINEN APH (SET/KITS/TRAYS/PACK) ×2 IMPLANT
SET TUBE IRRIG SUCTION NO TIP (IRRIGATION / IRRIGATOR) IMPLANT
SLEEVE ENDOPATH XCEL 5M (ENDOMECHANICALS) ×2 IMPLANT
SPONGE GAUZE 2X2 8PLY STRL LF (GAUZE/BANDAGES/DRESSINGS) ×2 IMPLANT
STAPLER VISISTAT (STAPLE) ×2 IMPLANT
SUT VICRYL 0 UR6 27IN ABS (SUTURE) ×2 IMPLANT
TAPE SURG TRANSPORE 1 IN (GAUZE/BANDAGES/DRESSINGS) ×1 IMPLANT
TAPE SURGICAL TRANSPORE 1 IN (GAUZE/BANDAGES/DRESSINGS) ×1
TROCAR ENDO BLADELESS 11MM (ENDOMECHANICALS) ×2 IMPLANT
TROCAR XCEL NON-BLD 5MMX100MML (ENDOMECHANICALS) ×2 IMPLANT
TROCAR XCEL UNIV SLVE 11M 100M (ENDOMECHANICALS) ×2 IMPLANT
TUBING INSUFFLATION (TUBING) ×2 IMPLANT
WARMER LAPAROSCOPE (MISCELLANEOUS) ×2 IMPLANT
YANKAUER SUCT 12FT TUBE ARGYLE (SUCTIONS) ×2 IMPLANT

## 2015-06-26 NOTE — Discharge Instructions (Signed)
Laparoscopic Cholecystectomy, Care After °Refer to this sheet in the next few weeks. These instructions provide you with information about caring for yourself after your procedure. Your health care provider may also give you more specific instructions. Your treatment has been planned according to current medical practices, but problems sometimes occur. Call your health care provider if you have any problems or questions after your procedure. °WHAT TO EXPECT AFTER THE PROCEDURE °After your procedure, it is common to have: °· Pain at your incision sites. You will be given pain medicines to control your pain. °· Mild nausea or vomiting. This should improve after the first 24 hours. °· Bloating and possible shoulder pain from the gas that was used during the procedure. This will improve after the first 24 hours. °HOME CARE INSTRUCTIONS °Incision Care °· Follow instructions from your health care provider about how to take care of your incisions. Make sure you: °¨ Wash your hands with soap and water before you change your bandage (dressing). If soap and water are not available, use hand sanitizer. °¨ Change your dressing as told by your health care provider. °¨ Leave stitches (sutures), skin glue, or adhesive strips in place. These skin closures may need to be in place for 2 weeks or longer. If adhesive strip edges start to loosen and curl up, you may trim the loose edges. Do not remove adhesive strips completely unless your health care provider tells you to do that. °· Do not take baths, swim, or use a hot tub until your health care provider approves. Ask your health care provider if you can take showers. You may only be allowed to take sponge baths for bathing. °General Instructions °· Take over-the-counter and prescription medicines only as told by your health care provider. °· Do not drive or operate heavy machinery while taking prescription pain medicine. °· Return to your normal diet as told by your health care  provider. °· Do not lift anything that is heavier than 10 lb (4.5 kg). °· Do not play contact sports for one week or until your health care provider approves. °SEEK MEDICAL CARE IF:  °· You have redness, swelling, or pain at the site of your incision. °· You have fluid, blood, or pus coming from your incision. °· You notice a bad smell coming from your incision area. °· Your surgical incisions break open. °· You have a fever. °SEEK IMMEDIATE MEDICAL CARE IF: °· You develop a rash. °· You have difficulty breathing. °· You have chest pain. °· You have increasing pain in your shoulders (shoulder strap areas). °· You faint or have dizzy episodes while you are standing. °· You have severe pain in your abdomen. °· You have nausea or vomiting that lasts for more than one day. °  °This information is not intended to replace advice given to you by your health care provider. Make sure you discuss any questions you have with your health care provider. °  °Document Released: 06/24/2005 Document Revised: 03/15/2015 Document Reviewed: 02/03/2013 °Elsevier Interactive Patient Education ©2016 Elsevier Inc. ° °

## 2015-06-26 NOTE — Anesthesia Postprocedure Evaluation (Signed)
Anesthesia Post Note  Patient: Stephen Avila  Procedure(s) Performed: Procedure(s) (LRB): LAPAROSCOPIC CHOLECYSTECTOMY (N/A)  Patient location during evaluation: PACU Anesthesia Type: General Level of consciousness: awake Pain management: pain level controlled Vital Signs Assessment: post-procedure vital signs reviewed and stable Respiratory status: respiratory function stable Cardiovascular status: stable Postop Assessment: no signs of nausea or vomiting Anesthetic complications: no    Last Vitals:  Filed Vitals:   06/26/15 0951 06/26/15 1100  BP: 138/81   Pulse:  78  Temp:    Resp: 17 15    Last Pain: There were no vitals filed for this visit.               Lamarius Dirr A

## 2015-06-26 NOTE — Op Note (Signed)
Patient:  Stephen Avila  DOB:  02-28-1976  MRN:  [id number]   Preop Diagnosis:  Cholecystitis, cholelithiasis  Postop Diagnosis:  Same  Procedure:  Laparoscopic cholecystectomy  Surgeon:  [name], M.D.  Anes:  Gen. endotracheal  Indications:  Patient is a 39 year old white male who presents with biliary colic secondary to cholelithiasis. The risks and benefits of the procedure including bleeding, infection, hepatobiliary injury, and the possibility of an open procedure were fully explained to the patient, who gave informed consent.  Procedure note:  The patient was placed in the supine position. After induction of general endotracheal anesthesia, the abdomen was prepped and draped using the usual sterile technique with DuraPrep. Surgical site confirmation was performed.  A supraumbilical incision was made down to the fascia. A Veress needle was introduced into the abdominal cavity and confirmation of placement was done using the saline drop test. The abdomen was then insufflated to 16 mmHg pressure. An 11 mm trocar was introduced into the abdominal cavity under direct visualization without difficulty. The patient was placed in reverse Trendelenburg position and an additional 11 mm trocar was placed the epigastric region and 5 mm trochars were placed the right upper quadrant and right flank regions. The liver was inspected and noted to be within normal limits. The gallbladder was retracted in a dynamic fashion in order to expose the triangle of Calot. The cystic duct was first identified. Its junction to the infundibulum was fully identified. Endoclips placed proximally and distally on the cystic duct, and the cystic duct was divided. This was likewise done cystic artery. The gallbladder was freed away from the gallbladder fossa using Bovie electrocautery. The gallbladder was delivered to the epigastric trocar site using an Endo Catch bag. The gallbladder fossa was inspected and no abnormal  bleeding or bile leakage was noted. Surgicel was placed the gallbladder fossa. All fluid and air were then evacuated from the abdominal cavity prior to removal of the trochars.  All wounds were irrigated with normal saline. All wounds were checked with 0.5% Sensorcaine. The supraumbilical fascia as well as epigastric fascia reapproximated using 0 Vicryl interrupted sutures. All skin incisions were closed using staples. Betadine ointment and dry sterile dressings were applied.  All tape and needle counts were correct at the end of the procedure. Patient was extubated in the operating room and transferred to PACU in stable condition. Complications:  None  EBL:  Minimal  Specimen:  Gallbladder

## 2015-06-26 NOTE — Anesthesia Preprocedure Evaluation (Signed)
Anesthesia Evaluation  Patient identified by MRN, date of birth, ID band Patient awake    Reviewed: Allergy & Precautions, NPO status , Patient's Chart, lab work & pertinent test results  Airway Mallampati: II  TM Distance: >3 FB     Dental  (+) Teeth Intact, Dental Advisory Given   Pulmonary neg pulmonary ROS,    breath sounds clear to auscultation       Cardiovascular hypertension, (-) angina+ dysrhythmias (tachycardia)  Rhythm:Regular Rate:Tachycardia     Neuro/Psych PSYCHIATRIC DISORDERS Anxiety    GI/Hepatic GERD  ,  Endo/Other    Renal/GU      Musculoskeletal   Abdominal   Peds  Hematology   Anesthesia Other Findings   Reproductive/Obstetrics                             Anesthesia Physical Anesthesia Plan  ASA: III  Anesthesia Plan: General   Post-op Pain Management:    Induction: Intravenous, Rapid sequence and Cricoid pressure planned  Airway Management Planned: Oral ETT  Additional Equipment:   Intra-op Plan:   Post-operative Plan: Extubation in OR  Informed Consent: I have reviewed the patients History and Physical, chart, labs and discussed the procedure including the risks, benefits and alternatives for the proposed anesthesia with the patient or authorized representative who has indicated his/her understanding and acceptance.     Plan Discussed with:   Anesthesia Plan Comments:         Anesthesia Quick Evaluation

## 2015-06-26 NOTE — Transfer of Care (Signed)
Immediate Anesthesia Transfer of Care Note  Patient: Stephen Avila  Procedure(s) Performed: Procedure(s): LAPAROSCOPIC CHOLECYSTECTOMY (N/A)  Patient Location: PACU  Anesthesia Type:General  Level of Consciousness: awake, alert , oriented and patient cooperative  Airway & Oxygen Therapy: Patient Spontanous Breathing and Patient connected to face mask oxygen  Post-op Assessment: Report given to RN and Post -op Vital signs reviewed and stable  Post vital signs: Reviewed and stable  Last Vitals:  Filed Vitals:   06/26/15 0950 06/26/15 0951  BP: 144/88 138/81  Pulse:    Temp:    Resp: 29 17    Complications: No apparent anesthesia complications

## 2015-06-26 NOTE — Anesthesia Procedure Notes (Signed)
Procedure Name: Intubation Date/Time: 06/26/2015 10:08 AM Performed by: Andree Elk, AMY A Pre-anesthesia Checklist: Patient identified, Patient being monitored, Timeout performed, Emergency Drugs available and Suction available Patient Re-evaluated:Patient Re-evaluated prior to inductionOxygen Delivery Method: Circle System Utilized Preoxygenation: Pre-oxygenation with 100% oxygen Intubation Type: IV induction, Rapid sequence and Cricoid Pressure applied Laryngoscope Size: 3 and Miller Grade View: Grade I Tube type: Oral Tube size: 7.0 mm Number of attempts: 1 Airway Equipment and Method: Stylet Placement Confirmation: ETT inserted through vocal cords under direct vision,  positive ETCO2 and breath sounds checked- equal and bilateral Secured at: 21 cm Tube secured with: Tape Dental Injury: Teeth and Oropharynx as per pre-operative assessment

## 2015-06-26 NOTE — Interval H&P Note (Signed)
History and Physical Interval Note:  06/26/2015 9:07 AM  Stephen Avila  has presented today for surgery, with the diagnosis of cholelithiasis  The various methods of treatment have been discussed with the patient and family. After consideration of risks, benefits and other options for treatment, the patient has consented to  Procedure(s) with comments: LAPAROSCOPIC CHOLECYSTECTOMY (N/A) - Pt moved up earlier-notified to be here @ 8:15am per [name]. as a surgical intervention .  The patient's history has been reviewed, patient examined, no change in status, stable for surgery.  I have reviewed the patient's chart and labs.  Questions were answered to the patient's satisfaction.     [name] A

## 2015-06-27 ENCOUNTER — Encounter (HOSPITAL_COMMUNITY): Payer: Self-pay | Admitting: General Surgery

## 2015-08-30 ENCOUNTER — Ambulatory Visit (INDEPENDENT_AMBULATORY_CARE_PROVIDER_SITE_OTHER): Payer: 59 | Admitting: Endocrinology

## 2015-08-30 ENCOUNTER — Encounter: Payer: Self-pay | Admitting: Endocrinology

## 2015-08-30 VITALS — BP 148/78 | HR 112 | Temp 98.3°F | Ht 66.0 in | Wt 209.0 lb

## 2015-08-30 DIAGNOSIS — E291 Testicular hypofunction: Secondary | ICD-10-CM

## 2015-08-30 DIAGNOSIS — E270 Other adrenocortical overactivity: Secondary | ICD-10-CM | POA: Insufficient documentation

## 2015-08-30 DIAGNOSIS — E0590 Thyrotoxicosis, unspecified without thyrotoxic crisis or storm: Secondary | ICD-10-CM

## 2015-08-30 DIAGNOSIS — E038 Other specified hypothyroidism: Secondary | ICD-10-CM | POA: Insufficient documentation

## 2015-08-30 DIAGNOSIS — E230 Hypopituitarism: Secondary | ICD-10-CM

## 2015-08-30 DIAGNOSIS — R61 Generalized hyperhidrosis: Secondary | ICD-10-CM

## 2015-08-30 MED ORDER — METOPROLOL SUCCINATE ER 25 MG PO TB24: 25.0000 mg | ORAL_TABLET | Freq: Every day | ORAL | Status: DC

## 2015-08-30 NOTE — Patient Instructions (Addendum)
blood tests are requested for you today.  We'll let you know about the results. If the thyroid is good, we would next check a 24-HR urine test, to see if your body make too much adrenaline.  i have sent a prescription to your pharmacy, to help your symptoms.  We'll need to recheck the cortisol at some point.  It is done like this: you would take dexamethasone 1 mg at 10 pm (on a night when you sleep), then come in for a "cortisol" blood test the next morning before 9 am.  you do not need to be fasting for this test.         Hyperthyroidism Hyperthyroidism is when the thyroid is too active (overactive). Your thyroid is a large gland that is located in your neck. The thyroid helps to control how your body uses food (metabolism). When your thyroid is overactive, it produces too much of a hormone called thyroxine.  CAUSES Causes of hyperthyroidism may include:  Graves disease. This is when your immune system attacks the thyroid gland. This is the most common cause.  Inflammation of the thyroid gland.  Tumor in the thyroid gland or somewhere else.  Excessive use of thyroid medicines, including:  Prescription thyroid supplement.  Herbal supplements that mimic thyroid hormones.  Solid or fluid-filled lumps within your thyroid gland (thyroid nodules).  Excessive ingestion of iodine. RISK FACTORS  Being male.  Having a family history of thyroid conditions. SIGNS AND SYMPTOMS Signs and symptoms of hyperthyroidism may include:  Nervousness.  Inability to tolerate heat.  Unexplained weight loss.  Diarrhea.  Change in the texture of hair or skin.  Heart skipping beats or making extra beats.  Rapid heart rate.  Loss of menstruation.  Shaky hands.  Fatigue.  Restlessness.  Increased appetite.  Sleep problems.  Enlarged thyroid gland or nodules. DIAGNOSIS  Diagnosis of hyperthyroidism may include:  Medical history and physical exam.  Blood  tests.  Ultrasound tests. TREATMENT Treatment may include:  Medicines to control your thyroid.  Surgery to remove your thyroid.  Radiation therapy. HOME CARE INSTRUCTIONS   Take medicines only as directed by your health care provider.  Do not use any tobacco products, including cigarettes, chewing tobacco, or electronic cigarettes. If you need help quitting, ask your health care provider.  Do not exercise or do physical activity until your health care provider approves.  Keep all follow-up appointments as directed by your health care provider. This is important. SEEK MEDICAL CARE IF:  Your symptoms do not get better with treatment.  You have fever.  You are taking thyroid replacement medicine and you:  Have depression.  Feel mentally and physically slow.  Have weight gain. SEEK IMMEDIATE MEDICAL CARE IF:   You have decreased alertness or a change in your awareness.  You have abdominal pain.  You feel dizzy.  You have a rapid heartbeat.  You have an irregular heartbeat.   This information is not intended to replace advice given to you by your health care provider. Make sure you discuss any questions you have with your health care provider.   Document Released: 06/24/2005 Document Revised: 07/15/2014 Document Reviewed: 11/09/2013 Elsevier Interactive Patient Education [employer].

## 2015-08-30 NOTE — Progress Notes (Signed)
Subjective:    Patient ID: Stephen Avila, male    DOB: [birth year], 40 y.o.   MRN: [id number]  HPI Pt reports he was dx'ed with hyperthyroidism in [place].  He was rx'ed tapazole.  He has never had XRT to the anterior neck, or thyroid surgery.  He has never had thyroid imaging.  He does not consume kelp or any other prescribed or non-prescribed thyroid medication.  He has never been on amiodarone.  He reports severe diaphoresis throughout the body, and assoc anxiety.  He works 3rd shift.  He has been on testosterone injections off and on since 2015.  Last dose was approx 3 mos ago.   Past Medical History  Diagnosis Date  . Kidney stones   . Hypertension   . Anxiety   . GERD (gastroesophageal reflux disease)   . Constipation   . Kidney stones     Past Surgical History  Procedure Laterality Date  . Hernia repair      x 2   . Lithotripsy    . Cholecystectomy N/A 06/26/2015    Procedure: LAPAROSCOPIC CHOLECYSTECTOMY;  Surgeon: [name], MD;  Location: [facility];  Service: General;  Laterality: N/A;    Social History   Social History  . Marital Status: Single    Spouse Name: N/A  . Number of Children: N/A  . Years of Education: N/A   Occupational History  . Not on file.   Social History Main Topics  . Smoking status: Never Smoker   . Smokeless tobacco: Current User    Types: Snuff  . Alcohol Use: No  . Drug Use: No  . Sexual Activity: Not on file   Other Topics Concern  . Not on file   Social History Narrative    Current Outpatient Prescriptions on File Prior to Visit  Medication Sig Dispense Refill  . cholecalciferol (VITAMIN D) 1000 UNITS tablet Take 1,000 Units by mouth daily.    . hydrochlorothiazide (HYDRODIURIL) 25 MG tablet Take 50 mg by mouth daily.    . Linaclotide (LINZESS) 145 MCG CAPS capsule Take 1 capsule (145 mcg total) by mouth daily. (Patient taking differently: Take 145 mcg by mouth 2 (two) times a week. ) 30 capsule 2  . Multiple  Vitamins-Minerals (MULTIVITAMINS THER. W/MINERALS) TABS tablet Take 1 tablet by mouth daily.    . Multiple Vitamins-Minerals (ZINC PO) Take 1 tablet by mouth daily.    . Omega-3 Fatty Acids (FISH OIL PO) Take 1 capsule by mouth daily.    Marland Kitchen glycopyrrolate (ROBINUL) 1 MG tablet Take 1 mg by mouth daily as needed (for sweating). Reported on 08/30/2015     No current facility-administered medications on file prior to visit.    No Known Allergies  Family History  Problem Relation Age of Onset  . Thyroid disease Neg Hx     BP 148/78 mmHg  Pulse 112  Temp(Src) 98.3 F (36.8 C) (Oral)  Ht 5\' 6"  (1.676 m)  Wt 209 lb (94.802 kg)  BMI 33.75 kg/m2  SpO2 96%  Review of Systems denies headache, hoarseness, visual loss, sob, diarrhea, polyuria, muscle weakness, rash, easy bruising, and rhinorrhea.  He has decreased libido, palpitations, tremor, insomnia, heat intolerance, and weight gain.      Objective:   Physical Exam VS: see vs page GEN: no distress HEAD: head: no deformity.  eyes: no periorbital swelling, no proptosis external nose and ears are normal mouth: no lesion seen.  NECK: supple, thyroid is slightly and diffusely enlarged.  CHEST WALL: no deformity.   LUNGS: clear to auscultation BREASTS:  No gynecomastia CV: tachycardic rate but reg rhythm; no murmur ABD: abdomen is soft, nontender.  no hepatosplenomegaly.  not distended.  no hernia MUSCULOSKELETAL: muscle bulk and strength are grossly normal.  no obvious joint swelling.  gait is normal and steady EXTEMITIES: no deformity.  no edema PULSES: no carotid bruit NEURO:  cn 2-12 grossly intact.   readily moves all 4's.  sensation is intact to touch on all 4's.  Moderate tremor of the hands. SKIN:  Normal texture and temperature.  No rash or suspicious lesion is visible. Normal hair distribution.    NODES:  None palpable at the neck.   PSYCH: alert, well-oriented.  Does not appear anxious nor depressed.   I have reviewed  outside records, and summarized: Pt was noted to have abnormal TFT, and referred here.  outside test results are reviewed: Testosterone=106 TSH=0.15 PM Cortisol=18  Lab Results  Component Value Date   TSH 0.514 08/30/2015   T4TOTAL 9.1 08/20/2013   Lab Results  Component Value Date   TESTOSTERONE 50* 08/30/2015      Assessment & Plan:  Hyperthyroidism: well-controlled.  Please continue the same methimazole.   Central hypogonadism: new, uncertain etiology.  Excessive diaphoresis: possibly due to hypogonadism.     Patient is advised the following: Patient Instructions  blood tests are requested for you today.  We'll let you know about the results. If the thyroid is good, we would next check a 24-HR urine test, to see if your body make too much adrenaline.  i have sent a prescription to your pharmacy, to help your symptoms.  We'll need to recheck the cortisol at some point.  It is done like this: you would take dexamethasone 1 mg at 10 pm (on a night when you sleep), then come in for a "cortisol" blood test the next morning before 9 am.  you do not need to be fasting for this test.         Hyperthyroidism Hyperthyroidism is when the thyroid is too active (overactive). Your thyroid is a large gland that is located in your neck. The thyroid helps to control how your body uses food (metabolism). When your thyroid is overactive, it produces too much of a hormone called thyroxine.  CAUSES Causes of hyperthyroidism may include:  Graves disease. This is when your immune system attacks the thyroid gland. This is the most common cause.  Inflammation of the thyroid gland.  Tumor in the thyroid gland or somewhere else.  Excessive use of thyroid medicines, including:  Prescription thyroid supplement.  Herbal supplements that mimic thyroid hormones.  Solid or fluid-filled lumps within your thyroid gland (thyroid nodules).  Excessive ingestion of iodine. RISK FACTORS  Being  male.  Having a family history of thyroid conditions. SIGNS AND SYMPTOMS Signs and symptoms of hyperthyroidism may include:  Nervousness.  Inability to tolerate heat.  Unexplained weight loss.  Diarrhea.  Change in the texture of hair or skin.  Heart skipping beats or making extra beats.  Rapid heart rate.  Loss of menstruation.  Shaky hands.  Fatigue.  Restlessness.  Increased appetite.  Sleep problems.  Enlarged thyroid gland or nodules. DIAGNOSIS  Diagnosis of hyperthyroidism may include:  Medical history and physical exam.  Blood tests.  Ultrasound tests. TREATMENT Treatment may include:  Medicines to control your thyroid.  Surgery to remove your thyroid.  Radiation therapy. HOME CARE INSTRUCTIONS   Take medicines only as directed by  your health care provider.  Do not use any tobacco products, including cigarettes, chewing tobacco, or electronic cigarettes. If you need help quitting, ask your health care provider.  Do not exercise or do physical activity until your health care provider approves.  Keep all follow-up appointments as directed by your health care provider. This is important. SEEK MEDICAL CARE IF:  Your symptoms do not get better with treatment.  You have fever.  You are taking thyroid replacement medicine and you:  Have depression.  Feel mentally and physically slow.  Have weight gain. SEEK IMMEDIATE MEDICAL CARE IF:   You have decreased alertness or a change in your awareness.  You have abdominal pain.  You feel dizzy.  You have a rapid heartbeat.  You have an irregular heartbeat.   This information is not intended to replace advice given to you by your health care provider. Make sure you discuss any questions you have with your health care provider.   Document Released: 06/24/2005 Document Revised: 07/15/2014 Document Reviewed: 11/09/2013 Elsevier Interactive Patient Education [employer].

## 2015-08-31 DIAGNOSIS — E230 Hypopituitarism: Secondary | ICD-10-CM | POA: Insufficient documentation

## 2015-08-31 DIAGNOSIS — R61 Generalized hyperhidrosis: Secondary | ICD-10-CM | POA: Insufficient documentation

## 2015-08-31 LAB — PROLACTIN: Prolactin: 7.3 ng/mL (ref 4.0–15.2)

## 2015-08-31 LAB — TESTOSTERONE,FREE AND TOTAL
TESTOSTERONE FREE: 3 pg/mL — AB (ref 6.8–21.5)
TESTOSTERONE: 50 ng/dL — AB (ref 348–1197)

## 2015-08-31 LAB — T4, FREE: Free T4: 1.01 ng/dL (ref 0.82–1.77)

## 2015-08-31 LAB — LUTEINIZING HORMONE: LH: 2 m[IU]/mL (ref 1.7–8.6)

## 2015-08-31 LAB — TSH: TSH: 0.514 u[IU]/mL (ref 0.450–4.500)

## 2015-09-08 ENCOUNTER — Telehealth: Payer: Self-pay | Admitting: Endocrinology

## 2015-09-08 NOTE — Telephone Encounter (Signed)
Pt said that the orders were not at [facility], she went to the [facility].

## 2015-09-08 NOTE — Telephone Encounter (Signed)
I faxed the orders per pt's request.

## 2015-09-25 ENCOUNTER — Other Ambulatory Visit: Payer: Self-pay

## 2015-09-26 ENCOUNTER — Telehealth: Payer: Self-pay | Admitting: Endocrinology

## 2015-09-26 NOTE — Telephone Encounter (Signed)
please call patient: Urine tests are normal

## 2015-09-27 NOTE — Telephone Encounter (Signed)
Pt advised of note below via voicemail. Requested a call back from if the pt would like to discuss.

## 2015-10-04 ENCOUNTER — Ambulatory Visit
Admission: RE | Admit: 2015-10-04 | Discharge: 2015-10-04 | Disposition: A | Discharge status: Home / Self Care | Payer: 59 | Source: Ambulatory Visit | Attending: Endocrinology | Admitting: Endocrinology

## 2015-10-04 DIAGNOSIS — E230 Hypopituitarism: Secondary | ICD-10-CM | POA: Diagnosis not present

## 2015-10-04 DIAGNOSIS — J341 Cyst and mucocele of nose and nasal sinus: Secondary | ICD-10-CM | POA: Insufficient documentation

## 2015-10-04 DIAGNOSIS — E0590 Thyrotoxicosis, unspecified without thyrotoxic crisis or storm: Secondary | ICD-10-CM | POA: Diagnosis not present

## 2015-10-04 DIAGNOSIS — R61 Generalized hyperhidrosis: Secondary | ICD-10-CM | POA: Diagnosis not present

## 2015-10-04 DIAGNOSIS — E270 Other adrenocortical overactivity: Secondary | ICD-10-CM | POA: Diagnosis not present

## 2015-10-04 DIAGNOSIS — E291 Testicular hypofunction: Secondary | ICD-10-CM

## 2015-10-04 MED ORDER — GADOBENATE DIMEGLUMINE 529 MG/ML IV SOLN
10.0000 mL | Freq: Once | INTRAVENOUS | Status: AC | PRN
  Administered 2015-10-04: 10 mL via INTRAVENOUS

## 2015-10-05 ENCOUNTER — Other Ambulatory Visit: Payer: Self-pay | Admitting: Endocrinology

## 2015-10-05 MED ORDER — CLOMIPHENE CITRATE 50 MG PO TABS: ORAL_TABLET | ORAL | Status: DC

## 2015-10-06 ENCOUNTER — Telehealth: Payer: Self-pay | Admitting: Endocrinology

## 2015-10-06 NOTE — Telephone Encounter (Signed)
I contacted the pt. Pt wanted to let the MD know he has completed the 24 hour urine results. We received a faxed copy of the blood tests from [facility] and the results have not been scanned in yet.  Pt will pick the clomid med up tonight and begin tomorrow.

## 2015-10-06 NOTE — Telephone Encounter (Signed)
Pt said he is returning your call and to reach him at [phone]

## 2015-10-16 ENCOUNTER — Encounter: Payer: Self-pay | Admitting: Endocrinology

## 2015-11-07 ENCOUNTER — Ambulatory Visit (INDEPENDENT_AMBULATORY_CARE_PROVIDER_SITE_OTHER): Payer: 59 | Admitting: Endocrinology

## 2015-11-07 ENCOUNTER — Encounter: Payer: Self-pay | Admitting: Endocrinology

## 2015-11-07 VITALS — BP 134/84 | HR 113 | Temp 98.6°F | Ht 66.0 in | Wt 203.0 lb

## 2015-11-07 DIAGNOSIS — E230 Hypopituitarism: Secondary | ICD-10-CM

## 2015-11-07 DIAGNOSIS — E291 Testicular hypofunction: Secondary | ICD-10-CM

## 2015-11-07 DIAGNOSIS — E0590 Thyrotoxicosis, unspecified without thyrotoxic crisis or storm: Secondary | ICD-10-CM

## 2015-11-07 DIAGNOSIS — E249 Cushing's syndrome, unspecified: Secondary | ICD-10-CM | POA: Insufficient documentation

## 2015-11-07 MED ORDER — DEXAMETHASONE 1 MG PO TABS: ORAL_TABLET | ORAL | Status: DC

## 2015-11-07 NOTE — Patient Instructions (Addendum)
blood tests are requested for you today.  We'll let you know about the results.   i have sent a prescription to your pharmacy, to help your symptoms.  you should do a "dexamethasone suppression test."  for this, you would take dexamethasone 1 mg at 10 pm, then come in for a "cortisol" blood test the next morning before 9 am.  you do not need to be fasting for this test.  Please come back for a follow-up appointment in 3 months.   Please also see Dr Hilma Favors, about your anxiety symptoms.

## 2015-11-07 NOTE — Progress Notes (Signed)
Subjective:    Patient ID: Stephen Avila, male    DOB: 07-31-1975, 39 y.o.   MRN: [id number]  HPI Pt returns for f/u of hyperthyroidism (dx'ed mid-2016; he was rx'ed tapazole; he has never had thyroid imaging.   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 pt states persistent excessive diaphoresis throughout the body, and assoc anxiety. Past Medical History  Diagnosis Date  . Kidney stones   . Hypertension   . Anxiety   . GERD (gastroesophageal reflux disease)   . Constipation   . Kidney stones     Past Surgical History  Procedure Laterality Date  . Hernia repair      x 2   . Lithotripsy    . Cholecystectomy N/A 06/26/2015    Procedure: LAPAROSCOPIC CHOLECYSTECTOMY;  Surgeon: [name], MD;  Location: [facility];  Service: General;  Laterality: N/A;    Social History   Social History  . Marital Status: Single    Spouse Name: N/A  . Number of Children: N/A  . Years of Education: N/A   Occupational History  . Not on file.   Social History Main Topics  . Smoking status: Never Smoker   . Smokeless tobacco: Current User    Types: Snuff  . Alcohol Use: No  . Drug Use: No  . Sexual Activity: Not on file   Other Topics Concern  . Not on file   Social History Narrative    Current Outpatient Prescriptions on File Prior to Visit  Medication Sig Dispense Refill  . cholecalciferol (VITAMIN D) 1000 UNITS tablet Take 1,000 Units by mouth daily.    . clomiPHENE (CLOMID) 50 MG tablet 1/4 tab daily 10 tablet 2  . glycopyrrolate (ROBINUL) 1 MG tablet Take 1 mg by mouth daily as needed (for sweating). Reported on 08/30/2015    . hydrochlorothiazide (HYDRODIURIL) 25 MG tablet Take 50 mg by mouth daily.    . Linaclotide (LINZESS) 145 MCG CAPS capsule Take 1 capsule (145 mcg total) by mouth daily. (Patient taking differently: Take 145 mcg by mouth 2 (two) times a week. ) 30  capsule 2  . methimazole (TAPAZOLE) 10 MG tablet Take 10 mg by mouth 2 (two) times daily.    . metoprolol succinate (TOPROL-XL) 25 MG 24 hr tablet Take 1 tablet (25 mg total) by mouth daily. 30 tablet 3  . Multiple Vitamins-Minerals (MULTIVITAMINS THER. W/MINERALS) TABS tablet Take 1 tablet by mouth daily.    . Multiple Vitamins-Minerals (ZINC PO) Take 1 tablet by mouth daily.    . Omega-3 Fatty Acids (FISH OIL PO) Take 1 capsule by mouth daily.     No current facility-administered medications on file prior to visit.    No Known Allergies  Family History  Problem Relation Age of Onset  . Thyroid disease Neg Hx     BP 134/84 mmHg  Pulse 113  Temp(Src) 98.6 F (37 C) (Oral)  Ht 5\' 6"  (1.676 m)  Wt 203 lb (92.08 kg)  BMI 32.78 kg/m2  SpO2 97%   Review of Systems Denies fever and weight change    Objective:   Physical Exam VITAL SIGNS:  See vs page GENERAL: no distress.  NECK: There is slight diffuse thyroid enlargement.  No thyroid nodule is palpable.  No palpable lymphadenopathy at the anterior neck.      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  We'll let you know about the results.   i have sent a prescription to your pharmacy, to help your symptoms.  you should do a "dexamethasone suppression test."  for this, you would take dexamethasone 1 mg at 10 pm, then come in for a "cortisol" blood test the next morning before 9 am.  you do not need to be fasting for this test.  Please come back for a follow-up appointment in 3 months.   Please also see Dr Hilma Favors, about your anxiety symptoms.

## 2016-06-22 IMAGING — MR MR HEAD WO/W CM
10 of 18 series · 36 of 48 positions shown · IV contrast (10ML MULTIHANCE)
Comparison: None.

CLINICAL DATA: Pituitary insufficiency. Hyperthyroidism.
Hypogonadism. Hypercortisolemia. Diaphoresis.

EXAM:
MRI HEAD WITHOUT AND WITH CONTRAST
TECHNIQUE: Multiplanar, multiecho pulse sequences of the brain and surrounding
structures were obtained without and with intravenous contrast.
CONTRAST:  10mL MULTIHANCE GADOBENATE DIMEGLUMINE 529 MG/ML IV SOLN

[Series 2: T1 · sagittal · 5.0mm · 0.62mm/px · 2 of 29 slices shown]
[im 1/29]
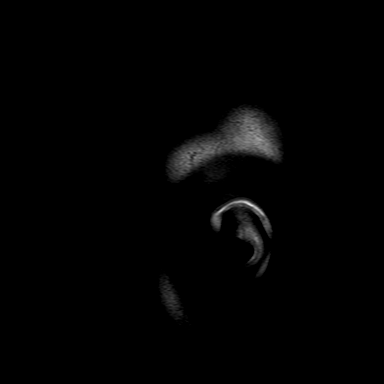
[im 10/29]
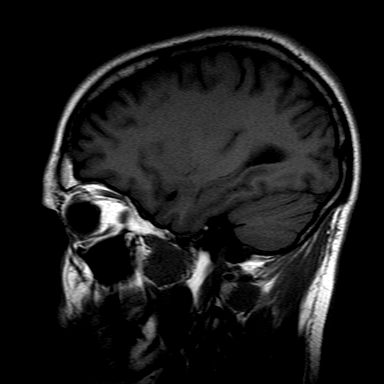

[Series 4: DWI · axial · 4.0mm · 0.94mm/px · z∈[-70,+104]mm · 6 of 45 slices shown (1 of 2)]
[im 1/45]
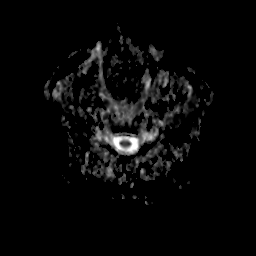
[im 9/45]
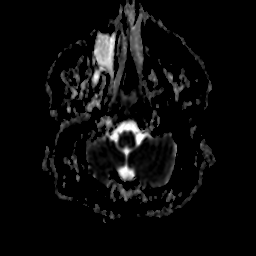
[im 18/45]
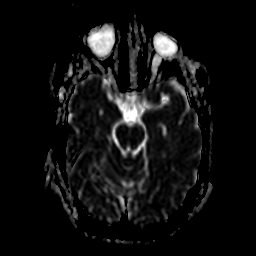
[im 27/45]
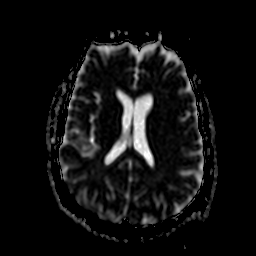
[im 36/45]
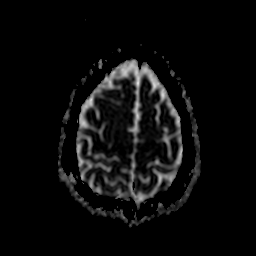
[im 45/45]
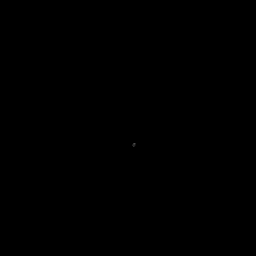

[Series 5: DWI · axial · 4.0mm · 0.94mm/px · z∈[-70,+100]mm · 5 of 44 slices shown (2 of 2)]
[im 1/44]
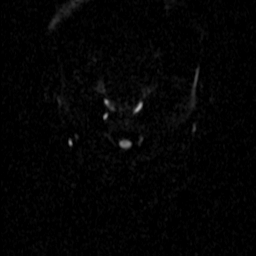
[im 11/44]
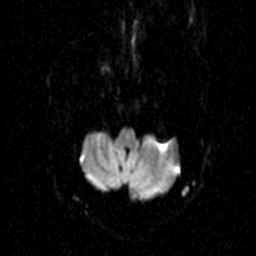
[im 22/44]
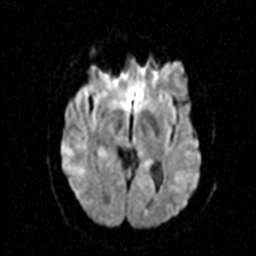
[im 33/44]
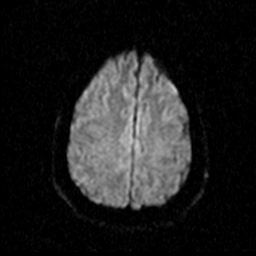
[im 44/44]
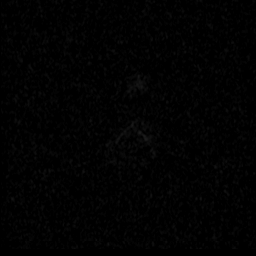

[Series 6: T2 · axial · 5.0mm · 0.57mm/px · z∈[-57,+110]mm · 3 of 25 slices shown (1 of 2)]
[im 1/25]
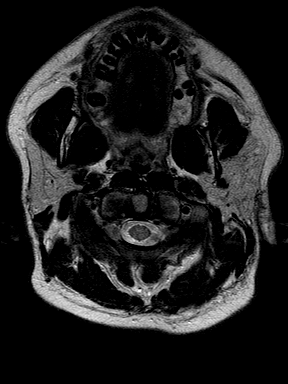
[im 13/25]
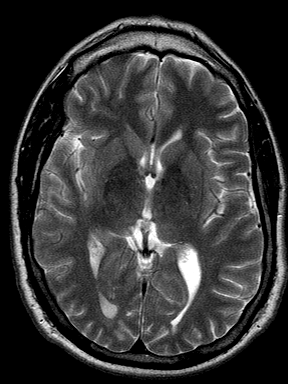
[im 25/25]
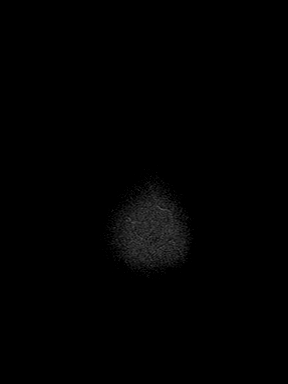

[Series 7: T2 · axial · 5.0mm · 0.57mm/px · z∈[-57,+110]mm · 3 of 25 slices shown (2 of 2)]
[im 1/25]
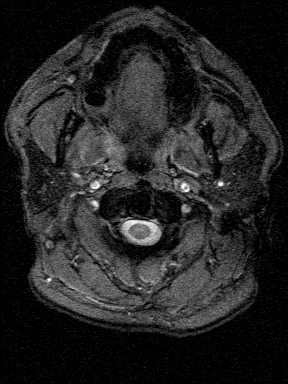
[im 13/25]
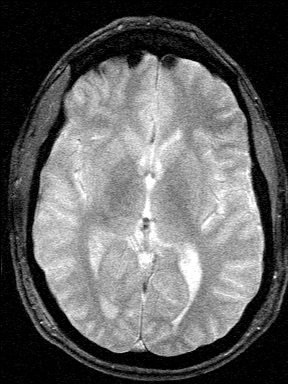
[im 25/25]
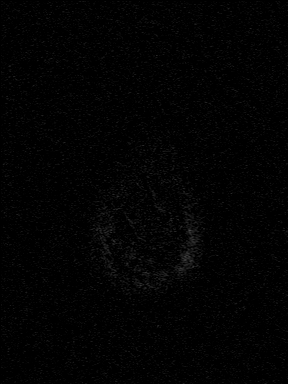

[Series 8: FLAIR · axial · 5.0mm · 0.57mm/px · z∈[-57,+110]mm · 3 of 25 slices shown]
[im 1/25]
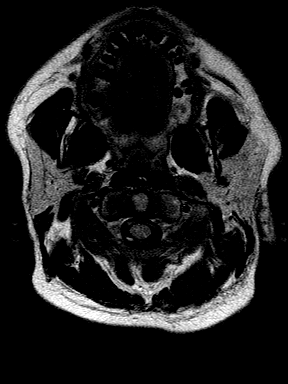
[im 13/25]
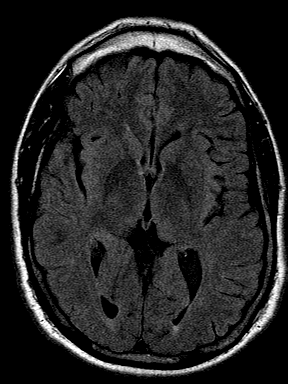
[im 25/25]
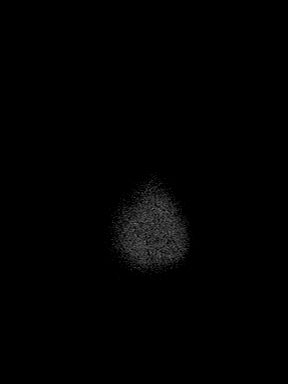

[Series 22: T1 post-contrast · sagittal · 3.0mm · 0.42mm/px · 2 of 15 slices shown (1 of 4)]
[im 1/15]
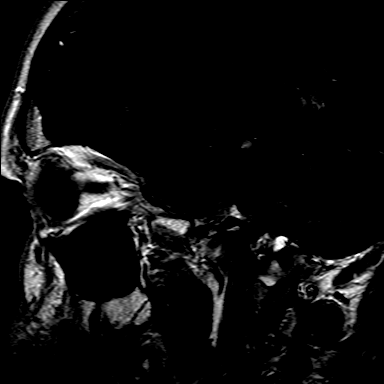
[im 15/15]
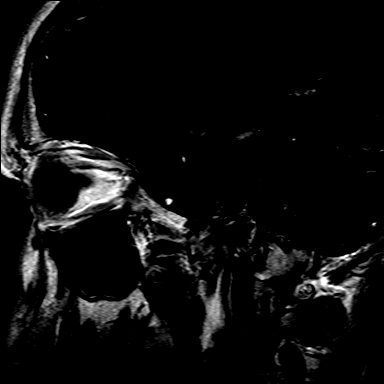

[Series 23: T1 post-contrast · coronal · 3.0mm · 0.42mm/px · 2 of 15 slices shown (2 of 4)]
[im 1/15]
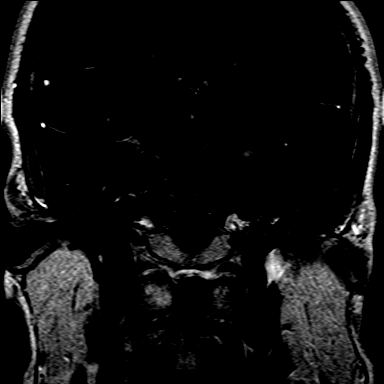
[im 15/15]
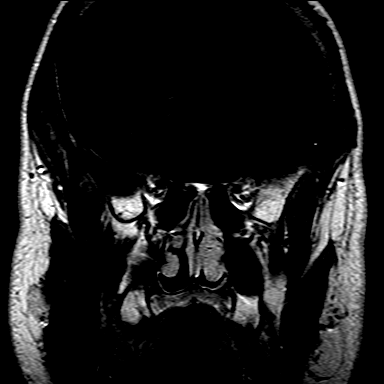

[Series 24: T1 post-contrast · axial · 3.0mm · 0.43mm/px · z∈[-68,+120]mm · 7 of 64 slices shown (3 of 4)]
[im 1/64]
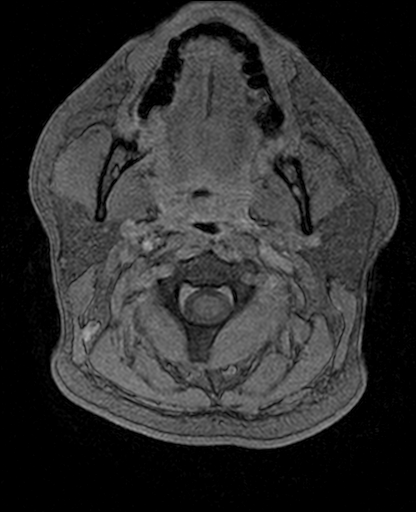
[im 11/64]
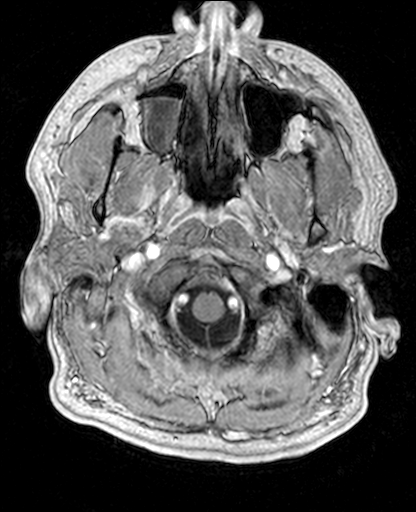
[im 22/64]
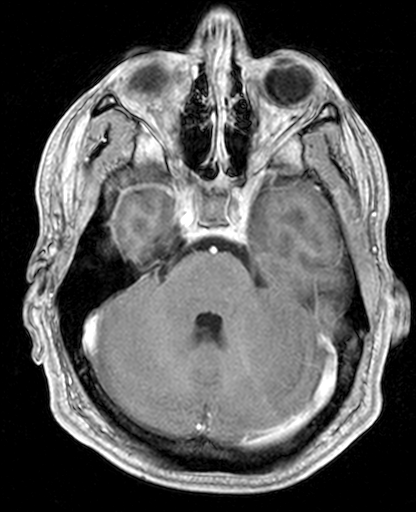
[im 32/64]
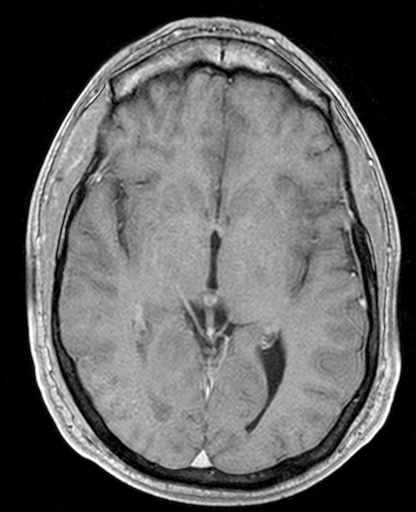
[im 43/64]
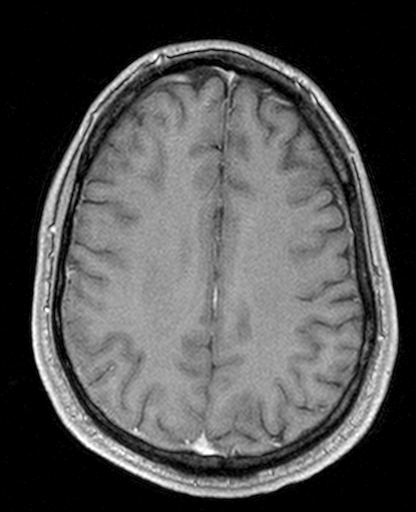
[im 53/64]
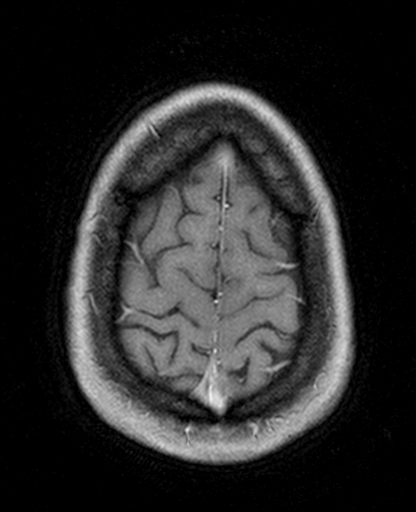
[im 64/64]
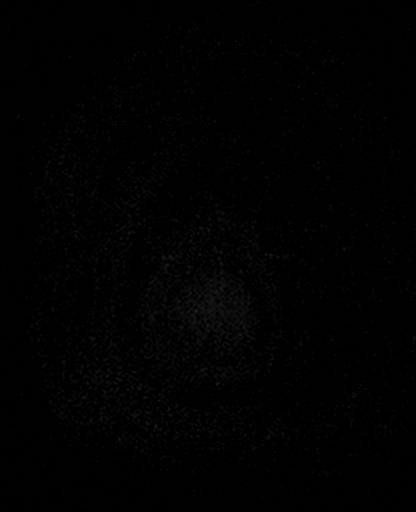

[Series 25: T1 post-contrast · coronal · 5.0mm · 0.43mm/px · 3 of 30 slices shown (4 of 4)]
[im 1/30]
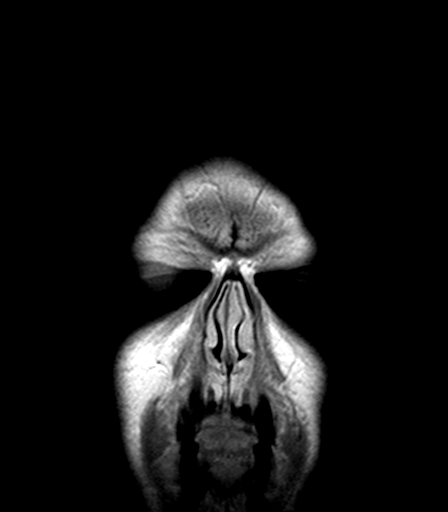
[im 15/30]
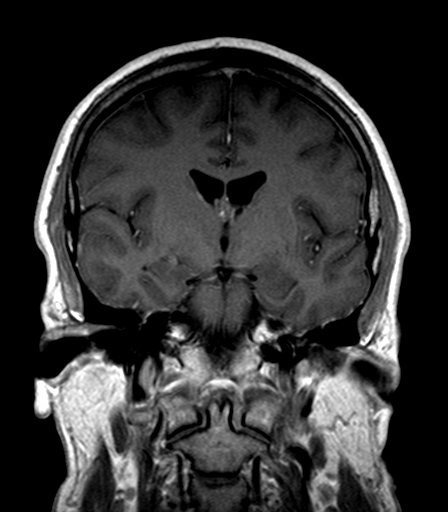
[im 30/30]
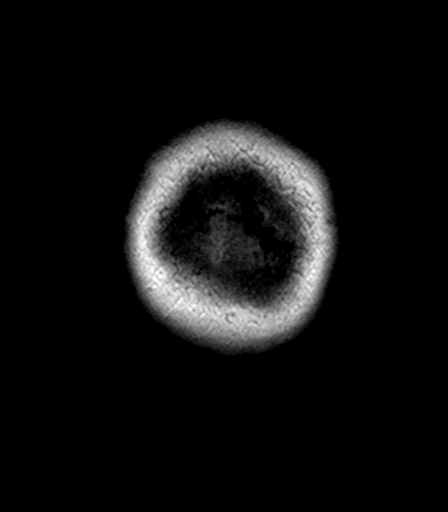

[36 of 48 positions shown; findings below may reference images not displayed]

FINDINGS: No acute infarct, hemorrhage, or mass lesion is present. The
ventricles are of normal size. No significant extraaxial fluid
collection is present.

No significant white matter disease is present.

The internal auditory canals are within normal limits bilaterally.
Flow is present in the major intracranial arteries. The globes
orbits are intact. A prominent polyp or mucous retention cyst is
present in the right maxillary sinus. The paranasal sinuses and
mastoid air cells are clear.

Dedicated imaging of the sella demonstrates normal size and
configuration of the pituitary. There is homogeneous enhancement on
dynamic imaging. The pituitary stalk is midline. The optic chiasm is
normal. The cavernous sinus is within normal limits bilaterally.

Postcontrast imaging through the remainder the brain is
unremarkable. The skullbase is within normal limits. Midline
sagittal images are unremarkable.
IMPRESSION: 1. Normal MRI of the brain and pituitary.
2. Prominent polyp or mucous retention cyst in the right maxillary
sinus.

## 2016-11-06 ENCOUNTER — Encounter: Payer: Self-pay | Admitting: Podiatry

## 2016-11-06 ENCOUNTER — Ambulatory Visit (INDEPENDENT_AMBULATORY_CARE_PROVIDER_SITE_OTHER): Payer: 59

## 2016-11-06 ENCOUNTER — Ambulatory Visit (INDEPENDENT_AMBULATORY_CARE_PROVIDER_SITE_OTHER): Payer: 59 | Admitting: Podiatry

## 2016-11-06 DIAGNOSIS — M79671 Pain in right foot: Secondary | ICD-10-CM

## 2016-11-06 DIAGNOSIS — S92354A Nondisplaced fracture of fifth metatarsal bone, right foot, initial encounter for closed fracture: Secondary | ICD-10-CM | POA: Diagnosis not present

## 2016-11-06 MED ORDER — HYDROCODONE-ACETAMINOPHEN 10-325 MG PO TABS: 1.0000 | ORAL_TABLET | Freq: Three times a day (TID) | ORAL | 0 refills | Status: DC | PRN

## 2016-11-06 NOTE — Progress Notes (Signed)
   Subjective:    Patient ID: Stephen Avila, male    DOB: 1975/09/10, 41 y.o.   MRN: [id number]  HPI: He presents today with severe pain to the lateral aspect of the right foot. States that he was stepping out of his truck one week ago and feeling a sharp pain in lateral aspect of the foot. He states is very painful to walk a considerable swelling. He's been using ice and Tylenol to no relief.  Review of Systems  All other systems reviewed and are negative.      Objective:   Physical Exam: Vital signs are stable alert and oriented 3 pulses are palpable neurologic sensorium is intact moderate edema overlying the dorsolateral aspect of the right foot. Radiographs taken today demonstrate Jones fracture nondisplaced non-comminuted lateral aspect fifth met right. No open lesions or wounds are noted. No ecchymosis is noted. Pinpoint tenderness is noted.        Assessment & Plan:  Jones fracture fifth met right.  Plan: Placed him in a Cam Walker recommended crutches and or knee scooter. Also wrote a prescription for pain medication will follow up with him in 1 month for another set of x-rays

## 2016-11-07 ENCOUNTER — Telehealth: Payer: Self-pay | Admitting: Podiatry

## 2016-11-07 NOTE — Telephone Encounter (Signed)
Patients girlfriend Stephen Avila called and said that when they left the office they went straight home. Patient said when he left the exam room he heard the pop in his foot and now his foot is swollen severly, and patient said that the 4th toe has indention in it and he thinks that the pop he heard was his 4th metatarsal. He can barely put any weight on this foot now and would like a nurse to call him if he needs to see Dr. Milinda Pointer again. And ASAP.  Please call the patient has his cell phone, or you may reach him thru Stephen Avila his girlfriend [phone].

## 2016-11-07 NOTE — Telephone Encounter (Addendum)
Left message for [name] to call for an appt to get pt in as soon as possible with another doctor. I apologized for not getting to pt sooner, and for the staff that did not schedule the pt. I spoke with pt and apologized for me and the staffer that did not make the appt immediately, and I told pt I felt he needed to be seen as soon as possible tomorrow by Dr. Amalia Hailey and transferred pt to schedulers.

## 2016-11-08 ENCOUNTER — Encounter: Payer: Self-pay | Admitting: Podiatry

## 2016-11-08 ENCOUNTER — Ambulatory Visit (INDEPENDENT_AMBULATORY_CARE_PROVIDER_SITE_OTHER): Payer: 59

## 2016-11-08 ENCOUNTER — Ambulatory Visit (INDEPENDENT_AMBULATORY_CARE_PROVIDER_SITE_OTHER): Payer: 59 | Admitting: Podiatry

## 2016-11-08 DIAGNOSIS — S92354A Nondisplaced fracture of fifth metatarsal bone, right foot, initial encounter for closed fracture: Secondary | ICD-10-CM | POA: Diagnosis not present

## 2016-11-08 DIAGNOSIS — S92354D Nondisplaced fracture of fifth metatarsal bone, right foot, subsequent encounter for fracture with routine healing: Secondary | ICD-10-CM

## 2016-11-11 NOTE — Progress Notes (Signed)
   HPI: Patient is a 40 year old male with h/o 5th metatarsal fracture. He was seen 2 days ago by Dr. Milinda Pointer and given a boot which he reports wearing but states it causes more pain. He states his pain is now severe.    Physical Exam: General: The patient is alert and oriented x3 in no acute distress.  Dermatology: Skin is warm, dry and supple bilateral lower extremities. Negative for open lesions or macerations.  Vascular: Palpable pedal pulses bilaterally. No edema or erythema noted. Capillary refill within normal limits.  Neurological: Epicritic and protective threshold grossly intact bilaterally.   Musculoskeletal Exam: Moderate diffuse edema to right foot. Range of motion within normal limits to all pedal and ankle joints bilateral. Muscle strength 5/5 in all groups bilateral.   Radiographic Exam:  Nondisplaced, closed, transverse fracture of right fifth metatarsal diaphysis.  Assessment: 1. Fifth metatarsal fracture-right   Plan of Care:  1. Patient was evaluated. X-Rays reviewed. 2. Continue nonweightbearing in CAM boot. 3. Return to clinic in 3 weeks to follow up with Dr. Milinda Pointer.   Edrick Kins, DPM [facility]  Dr. Edrick Kins, [address]                                        [place], [place] [address]                Office [phone]  Fax [phone]

## 2016-11-12 ENCOUNTER — Other Ambulatory Visit: Payer: Self-pay | Admitting: Internal Medicine

## 2016-11-12 DIAGNOSIS — E0590 Thyrotoxicosis, unspecified without thyrotoxic crisis or storm: Secondary | ICD-10-CM

## 2016-11-14 ENCOUNTER — Ambulatory Visit: Payer: Self-pay | Admitting: Podiatry

## 2016-12-04 ENCOUNTER — Ambulatory Visit (INDEPENDENT_AMBULATORY_CARE_PROVIDER_SITE_OTHER): Payer: 59 | Admitting: Podiatry

## 2016-12-04 ENCOUNTER — Encounter: Payer: Self-pay | Admitting: Podiatry

## 2016-12-04 ENCOUNTER — Ambulatory Visit (INDEPENDENT_AMBULATORY_CARE_PROVIDER_SITE_OTHER): Payer: 59

## 2016-12-04 DIAGNOSIS — S92354D Nondisplaced fracture of fifth metatarsal bone, right foot, subsequent encounter for fracture with routine healing: Secondary | ICD-10-CM | POA: Diagnosis not present

## 2016-12-04 NOTE — Progress Notes (Signed)
He presents today for follow-up of his Jones fracture fifth metatarsal right. He states that he continues to use his knee scooter and seems to be doing better with that.  Objective: Vital signs are stable he's alert and oriented 3. Much decrease in edema and no erythema ecchymosis noted wounds. Radiographs demonstrate Jones fracture with bone callus forming. There is been some mild distraction since I saw him last but not excessive. I feel that this will go on to heal without surgical intervention provided he continues current therapies. He will remain out of work until this has completely healed.  Assessment: Well-healing Jones fracture fifth right. Follow-up with me in 3-4 weeks for another set of x-rays

## 2016-12-12 ENCOUNTER — Encounter
Admission: RE | Admit: 2016-12-12 | Discharge: 2016-12-12 | Disposition: A | Discharge status: Home / Self Care | Payer: 59 | Source: Ambulatory Visit | Attending: Internal Medicine | Admitting: Internal Medicine

## 2016-12-12 DIAGNOSIS — E0590 Thyrotoxicosis, unspecified without thyrotoxic crisis or storm: Secondary | ICD-10-CM

## 2016-12-12 MED ORDER — SODIUM IODIDE I-123 7.4 MBQ CAPS
154.0000 | ORAL_CAPSULE | Freq: Once | ORAL | Status: AC
  Administered 2016-12-12: 154 via ORAL

## 2016-12-13 ENCOUNTER — Encounter: Admission: RE | Admit: 2016-12-13 | Payer: 59 | Source: Ambulatory Visit

## 2016-12-25 ENCOUNTER — Encounter: Payer: Self-pay | Admitting: Podiatry

## 2016-12-25 ENCOUNTER — Ambulatory Visit (INDEPENDENT_AMBULATORY_CARE_PROVIDER_SITE_OTHER): Payer: 59

## 2016-12-25 ENCOUNTER — Ambulatory Visit (INDEPENDENT_AMBULATORY_CARE_PROVIDER_SITE_OTHER): Payer: 59 | Admitting: Podiatry

## 2016-12-25 DIAGNOSIS — S92354D Nondisplaced fracture of fifth metatarsal bone, right foot, subsequent encounter for fracture with routine healing: Secondary | ICD-10-CM | POA: Diagnosis not present

## 2016-12-25 NOTE — Progress Notes (Signed)
He presents today for follow-up of his Jones fracture. He states that on still having cramping in the ball but is not as severe as it wasn't past. Continues to wear his Cam Walker except for short trips to the bathroom in the middle the night.  Objective: Vital signs are stable alert and oriented 3. Pulses are palpable. Neurologic sensorium is intact. No longer has swelling overlying the fifth metatarsal of the right foot radiographs do confirm some healing that is still not as significant as it should be at this point. I'm afraid this is going on to malunion or nonunion. Date of injury was May 1 and we have not progressed very far at this point.  Assessment: Jones fracture fifth metatarsal right foot.  Plan: Recommended he continue the use of the Cam Walker. No walking without the Cam Walker. We will consider a bone stimulator since possible.

## 2017-01-22 ENCOUNTER — Ambulatory Visit (INDEPENDENT_AMBULATORY_CARE_PROVIDER_SITE_OTHER): Payer: 59 | Admitting: Podiatry

## 2017-01-22 ENCOUNTER — Ambulatory Visit (INDEPENDENT_AMBULATORY_CARE_PROVIDER_SITE_OTHER): Payer: 59

## 2017-01-22 ENCOUNTER — Encounter: Payer: Self-pay | Admitting: Podiatry

## 2017-01-22 DIAGNOSIS — S92354D Nondisplaced fracture of fifth metatarsal bone, right foot, subsequent encounter for fracture with routine healing: Secondary | ICD-10-CM

## 2017-01-22 DIAGNOSIS — S92909K Unspecified fracture of unspecified foot, subsequent encounter for fracture with nonunion: Secondary | ICD-10-CM

## 2017-01-22 NOTE — Progress Notes (Addendum)
He presents today for follow-up of his fifth metatarsal fracture/Jones fracture from the end of April. I initially saw him May 2 with a Jones fracture right foot. He states that the swelling has gone down and the foot feels some better but I can tell that it is still painful.  Objective: Vital signs are stable he is alert and oriented 3. He relates some neuroma symptoms to the third interdigital space of the right foot but still significant pain on attempted range of motion of the fifth metatarsal at the fracture site. Radiographs taken today do demonstrate delayed union with spurring. No large bone callus but possibly as much as 25% healing at this point.  Assessment: Delayed union Jones fracture right.  Plan: Follow-up with him in 1 month at which time we will try to get a bone stimulator. At this point I would allow him start walking in his tennis shoes to some degree. I feel that we need some motion in this area to stimulate healing.

## 2017-02-24 ENCOUNTER — Encounter: Payer: Self-pay | Admitting: Podiatry

## 2017-02-24 ENCOUNTER — Ambulatory Visit (INDEPENDENT_AMBULATORY_CARE_PROVIDER_SITE_OTHER): Payer: 59 | Admitting: Podiatry

## 2017-02-24 ENCOUNTER — Ambulatory Visit (INDEPENDENT_AMBULATORY_CARE_PROVIDER_SITE_OTHER): Payer: 59

## 2017-02-24 DIAGNOSIS — S92351G Displaced fracture of fifth metatarsal bone, right foot, subsequent encounter for fracture with delayed healing: Secondary | ICD-10-CM

## 2017-02-24 NOTE — Progress Notes (Addendum)
He presents today for follow-up of his delayed union now which has been in excess of 90 days He presents today stating that he continues to have pain with and without the boot.  Objective: Vital signs are stable he is alert and oriented 3. Decrease in edema still has moderate severe pain on palpation of the fifth metatarsal base Jones fracture right foot. Radiographs taken today demonstrate absolutely no change from last radiographs taken demonstrating delayed  Assessment delayed fifth met base Jones fracture right foot.  Plan: At this point we are requesting the use of an Exogen bone stimulator to assist in healing this fracture. We will make this request and writing today. Patient continues to be out of work.

## 2017-03-26 ENCOUNTER — Ambulatory Visit (INDEPENDENT_AMBULATORY_CARE_PROVIDER_SITE_OTHER): Payer: 59

## 2017-03-26 ENCOUNTER — Encounter: Payer: Self-pay | Admitting: Podiatry

## 2017-03-26 ENCOUNTER — Ambulatory Visit (INDEPENDENT_AMBULATORY_CARE_PROVIDER_SITE_OTHER): Payer: 59 | Admitting: Podiatry

## 2017-03-26 DIAGNOSIS — S92351G Displaced fracture of fifth metatarsal bone, right foot, subsequent encounter for fracture with delayed healing: Secondary | ICD-10-CM
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
## 2017-05-07 ENCOUNTER — Encounter: Payer: 59 | Admitting: Podiatry

## 2017-05-07 ENCOUNTER — Ambulatory Visit: Payer: Self-pay

## 2017-05-07 DIAGNOSIS — S92351G Displaced fracture of fifth metatarsal bone, right foot, subsequent encounter for fracture with delayed healing: Secondary | ICD-10-CM

## 2017-05-07 NOTE — Progress Notes (Signed)
This encounter was created in error - please disregard.

## 2018-06-18 ENCOUNTER — Ambulatory Visit: Payer: Self-pay | Admitting: Family Medicine

## 2018-06-25 ENCOUNTER — Ambulatory Visit: Payer: Self-pay | Admitting: Family Medicine

## 2018-09-19 ENCOUNTER — Encounter: Payer: Self-pay | Admitting: Podiatry

## 2018-09-19 ENCOUNTER — Ambulatory Visit (INDEPENDENT_AMBULATORY_CARE_PROVIDER_SITE_OTHER): Payer: BLUE CROSS/BLUE SHIELD

## 2018-09-19 ENCOUNTER — Encounter: Payer: Self-pay | Admitting: *Deleted

## 2018-09-19 ENCOUNTER — Other Ambulatory Visit: Payer: Self-pay | Admitting: Podiatry

## 2018-09-19 ENCOUNTER — Other Ambulatory Visit: Payer: Self-pay

## 2018-09-19 ENCOUNTER — Ambulatory Visit (INDEPENDENT_AMBULATORY_CARE_PROVIDER_SITE_OTHER): Payer: BLUE CROSS/BLUE SHIELD | Admitting: Podiatry

## 2018-09-19 DIAGNOSIS — S92302A Fracture of unspecified metatarsal bone(s), left foot, initial encounter for closed fracture: Secondary | ICD-10-CM

## 2018-09-19 MED ORDER — HYDROCODONE-ACETAMINOPHEN 5-325 MG PO TABS: 1.0000 | ORAL_TABLET | Freq: Four times a day (QID) | ORAL | 0 refills | Status: DC | PRN

## 2018-10-01 ENCOUNTER — Other Ambulatory Visit: Payer: Self-pay

## 2018-10-01 ENCOUNTER — Ambulatory Visit (INDEPENDENT_AMBULATORY_CARE_PROVIDER_SITE_OTHER): Payer: BLUE CROSS/BLUE SHIELD

## 2018-10-01 ENCOUNTER — Ambulatory Visit: Payer: BLUE CROSS/BLUE SHIELD | Admitting: Podiatry

## 2018-10-01 ENCOUNTER — Encounter: Payer: Self-pay | Admitting: Podiatry

## 2018-10-01 ENCOUNTER — Other Ambulatory Visit: Payer: Self-pay | Admitting: Podiatry

## 2018-10-01 DIAGNOSIS — S92302D Fracture of unspecified metatarsal bone(s), left foot, subsequent encounter for fracture with routine healing: Secondary | ICD-10-CM | POA: Diagnosis not present

## 2018-10-01 DIAGNOSIS — S92302A Fracture of unspecified metatarsal bone(s), left foot, initial encounter for closed fracture: Secondary | ICD-10-CM

## 2018-10-01 NOTE — Progress Notes (Signed)
  Subjective:  Patient ID: Stephen Avila, male    DOB: [birth year],  MRN: [id number]  Chief Complaint  Patient presents with  . Foot Injury    Pt states still some pain but swelling has decreased a lot in past few days.    43 y.o. male presents with the above complaint. Pain and swelling improving.  Review of Systems: Negative except as noted in the HPI. Denies N/V/F/Ch.  Past Medical History:  Diagnosis Date  . Anxiety   . Constipation   . GERD (gastroesophageal reflux disease)   . Hypertension   . Kidney stones   . Kidney stones     Current Outpatient Medications:  .  buprenorphine (SUBUTEX) 8 MG SUBL SL tablet, TAKE 2.5 TABLETS EVERY DAY, Disp: , Rfl:  .  HYDROcodone-acetaminophen (NORCO) 5-325 MG tablet, Take 1 tablet by mouth every 6 (six) hours as needed for moderate pain., Disp: 30 tablet, Rfl: 0  Social History   Tobacco Use  Smoking Status Never Smoker  Smokeless Tobacco Current User  . Types: Snuff    No Known Allergies Objective:  There were no vitals filed for this visit. There is no height or weight on file to calculate BMI. Constitutional Well developed. Well nourished.  Vascular Dorsalis pedis pulses palpable bilaterally. Posterior tibial pulses palpable bilaterally. Capillary refill normal to all digits.  No cyanosis or clubbing noted. Pedal hair growth normal.  Neurologic Normal speech. Oriented to person, place, and time. Epicritic sensation to light touch grossly present bilaterally.  Dermatologic Nails well groomed and normal in appearance. No open wounds. No skin lesions.  Orthopedic: POP L 5th Metatarsal.    Radiographs: taken and reviewed no interval displacement, but no osseous healing or callus noted. Assessment:   1. Closed nondisplaced fracture of metatarsal bone of left foot, unspecified metatarsal, initial encounter    Plan:  Patient was evaluated and treated and all questions answered.  Fracture Left 5th metatarsal  -Continue NWB in CAM walker -Tubigrip applied for edema reduction -F/u in  4 weeks for new XR -Continue out of work status.  Return in about 4 weeks (around 10/29/2018).

## 2018-10-06 NOTE — Progress Notes (Signed)
  Subjective:  Patient ID: Stephen Avila, male    DOB: Apr 05, 1976,  MRN: [id number]  Chief Complaint  Patient presents with  . Foot Problem    i fell yesterday moving a leaf blower on the left foot about 3 pm     43 y.o. male presents with the above complaint.  States that he fell while moving a leaf blower and instantly felt a pop and pain in the left foot.  States that he fractured the right fifth metatarsal bone concerned that he did this to his left foot.   Review of Systems: Negative except as noted in the HPI. Denies N/V/F/Ch.  Past Medical History:  Diagnosis Date  . Anxiety   . Constipation   . GERD (gastroesophageal reflux disease)   . Hypertension   . Kidney stones   . Kidney stones     Current Outpatient Medications:  .  buprenorphine (SUBUTEX) 8 MG SUBL SL tablet, TAKE 2.5 TABLETS EVERY DAY, Disp: , Rfl:  .  HYDROcodone-acetaminophen (NORCO) 5-325 MG tablet, Take 1 tablet by mouth every 6 (six) hours as needed for moderate pain., Disp: 30 tablet, Rfl: 0  Social History   Tobacco Use  Smoking Status Never Smoker  Smokeless Tobacco Current User  . Types: Snuff    No Known Allergies Objective:  There were no vitals filed for this visit. There is no height or weight on file to calculate BMI. Constitutional Well developed. Well nourished.  Vascular Dorsalis pedis pulses palpable bilaterally. Posterior tibial pulses palpable bilaterally. Capillary refill normal to all digits.  No cyanosis or clubbing noted. Pedal hair growth normal.  Neurologic Normal speech. Oriented to person, place, and time. Epicritic sensation to light touch grossly present bilaterally.  Dermatologic Nails well groomed and normal in appearance. No open wounds. No skin lesions.  Orthopedic:  Pain palpation about the left fifth metatarsal proximal shaft   Radiographs: Views suboptimal secondary to pain however fracture evident about the fifth metatarsal. Assessment:   1. Closed  nondisplaced fracture of metatarsal bone of left foot, unspecified metatarsal, initial encounter    Plan:  Patient was evaluated and treated and all questions answered.  Fracture of left fifth metatarsal -X-rays reviewed -Discussed with patient that I think he would benefit from trial of nonoperative management.  We will follow-up in 2 weeks for recheck to see if the fracture alignment has changed -Compression dressing applied for reduction of swelling and for protection -Nonweightbearing in cam boot knee scooter assistance   Return in about 2 weeks (around 10/03/2018) for Fracture f/u left.

## 2018-10-14 DIAGNOSIS — Z299 Encounter for prophylactic measures, unspecified: Secondary | ICD-10-CM | POA: Diagnosis not present

## 2018-10-14 DIAGNOSIS — Z789 Other specified health status: Secondary | ICD-10-CM | POA: Diagnosis not present

## 2018-10-14 DIAGNOSIS — I10 Essential (primary) hypertension: Secondary | ICD-10-CM | POA: Diagnosis not present

## 2018-10-14 DIAGNOSIS — R7989 Other specified abnormal findings of blood chemistry: Secondary | ICD-10-CM | POA: Diagnosis not present

## 2018-10-14 DIAGNOSIS — Z6832 Body mass index (BMI) 32.0-32.9, adult: Secondary | ICD-10-CM | POA: Diagnosis not present

## 2018-10-30 ENCOUNTER — Encounter: Payer: Self-pay | Admitting: Podiatry

## 2018-10-30 ENCOUNTER — Ambulatory Visit (INDEPENDENT_AMBULATORY_CARE_PROVIDER_SITE_OTHER): Payer: BLUE CROSS/BLUE SHIELD

## 2018-10-30 ENCOUNTER — Ambulatory Visit: Payer: BLUE CROSS/BLUE SHIELD | Admitting: Podiatry

## 2018-10-30 ENCOUNTER — Other Ambulatory Visit: Payer: Self-pay

## 2018-10-30 VITALS — Temp 97.5°F

## 2018-10-30 DIAGNOSIS — S92302D Fracture of unspecified metatarsal bone(s), left foot, subsequent encounter for fracture with routine healing: Secondary | ICD-10-CM | POA: Diagnosis not present

## 2018-10-30 NOTE — Progress Notes (Signed)
  Subjective:  Patient ID: Stephen Avila, male    DOB: 1975-11-26,  MRN: [id number]  Chief Complaint  Patient presents with  . Fracture    Follow up fracture 5th metatarsal left   "Its better, but sore. I'm using this knee scooter 90% of the day"    43 y.o. male presents with the above complaint. Hx as above.  Review of Systems: Negative except as noted in the HPI. Denies N/V/F/Ch.  Past Medical History:  Diagnosis Date  . Anxiety   . Constipation   . GERD (gastroesophageal reflux disease)   . Hypertension   . Kidney stones   . Kidney stones     Current Outpatient Medications:  .  amLODipine (NORVASC) 10 MG tablet, TAKE 1 TABLET BY MOUTH EVERYDAY AT BEDTIME, Disp: , Rfl:  .  buprenorphine (SUBUTEX) 8 MG SUBL SL tablet, TAKE 2.5 TABLETS EVERY DAY, Disp: , Rfl:  .  HYDROcodone-acetaminophen (NORCO) 5-325 MG tablet, Take 1 tablet by mouth every 6 (six) hours as needed for moderate pain., Disp: 30 tablet, Rfl: 0 .  losartan-hydrochlorothiazide (HYZAAR) 100-25 MG tablet, Take 1 tablet by mouth daily., Disp: , Rfl:  .  testosterone cypionate (DEPOTESTOSTERONE CYPIONATE) 200 MG/ML injection, INJECT 1 ML EVERY 2 WEEKS, Disp: , Rfl:   Social History   Tobacco Use  Smoking Status Never Smoker  Smokeless Tobacco Current User  . Types: Snuff    No Known Allergies Objective:   Vitals:   10/30/18 0917  Temp: (!) 97.5 F (36.4 C)   There is no height or weight on file to calculate BMI. Constitutional Well developed. Well nourished.  Vascular Dorsalis pedis pulses palpable bilaterally. Posterior tibial pulses palpable bilaterally. Capillary refill normal to all digits.  No cyanosis or clubbing noted. Pedal hair growth normal.  Neurologic Normal speech. Oriented to person, place, and time. Epicritic sensation to light touch grossly present bilaterally.  Dermatologic Nails well groomed and normal in appearance. No open wounds. No skin lesions.  Orthopedic: POP L 5th  Metatarsal.    Radiographs: taken and reviewed interval healing noted, incomplete, callus formation noted. Assessment:   1. Closed nondisplaced fracture of metatarsal bone of left foot with routine healing, unspecified metatarsal, subsequent encounter    Plan:  Patient was evaluated and treated and all questions answered.  Fracture Left 5th metatarsal -Continue NWB in CAM walker, knee scooter. -F/u in 2 weeks for new XR -Continue out of work status.  Return in about 2 weeks (around 11/13/2018) for XRs Fracture F/u.

## 2018-11-13 ENCOUNTER — Encounter: Payer: Self-pay | Admitting: Podiatry

## 2018-11-13 ENCOUNTER — Ambulatory Visit (INDEPENDENT_AMBULATORY_CARE_PROVIDER_SITE_OTHER): Payer: BLUE CROSS/BLUE SHIELD

## 2018-11-13 ENCOUNTER — Other Ambulatory Visit: Payer: Self-pay

## 2018-11-13 ENCOUNTER — Ambulatory Visit: Payer: BLUE CROSS/BLUE SHIELD | Admitting: Podiatry

## 2018-11-13 VITALS — Temp 98.4°F

## 2018-11-13 DIAGNOSIS — M216X1 Other acquired deformities of right foot: Secondary | ICD-10-CM

## 2018-11-13 DIAGNOSIS — S92302D Fracture of unspecified metatarsal bone(s), left foot, subsequent encounter for fracture with routine healing: Secondary | ICD-10-CM

## 2018-11-13 DIAGNOSIS — M216X9 Other acquired deformities of unspecified foot: Secondary | ICD-10-CM | POA: Diagnosis not present

## 2018-11-13 DIAGNOSIS — M216X2 Other acquired deformities of left foot: Secondary | ICD-10-CM

## 2018-11-13 NOTE — Progress Notes (Signed)
  Subjective:  Patient ID: Stephen Avila, male    DOB: July 24, 1975,  MRN: [id number]  Chief Complaint  Patient presents with  . Foot Problem    left foot pain, pt is doing alot better since his last visit, pt is no longer feeling any pain, and no longer has any comments or concerns    43 y.o. male presents with the above complaint. Hx as above. States his boot broke so he went into sneakers and is not having pain.  Review of Systems: Negative except as noted in the HPI. Denies N/V/F/Ch.  Past Medical History:  Diagnosis Date  . Anxiety   . Constipation   . GERD (gastroesophageal reflux disease)   . Hypertension   . Kidney stones   . Kidney stones     Current Outpatient Medications:  .  amLODipine (NORVASC) 10 MG tablet, TAKE 1 TABLET BY MOUTH EVERYDAY AT BEDTIME, Disp: , Rfl:  .  buprenorphine (SUBUTEX) 8 MG SUBL SL tablet, TAKE 2.5 TABLETS EVERY DAY, Disp: , Rfl:  .  HYDROcodone-acetaminophen (NORCO) 5-325 MG tablet, Take 1 tablet by mouth every 6 (six) hours as needed for moderate pain., Disp: 30 tablet, Rfl: 0 .  losartan-hydrochlorothiazide (HYZAAR) 100-25 MG tablet, Take 1 tablet by mouth daily., Disp: , Rfl:  .  testosterone cypionate (DEPOTESTOSTERONE CYPIONATE) 200 MG/ML injection, INJECT 1 ML EVERY 2 WEEKS, Disp: , Rfl:   Social History   Tobacco Use  Smoking Status Never Smoker  Smokeless Tobacco Current User  . Types: Snuff    No Known Allergies Objective:   Vitals:   11/13/18 1022  Temp: 98.4 F (36.9 C)   There is no height or weight on file to calculate BMI. Constitutional Well developed. Well nourished.  Vascular Dorsalis pedis pulses palpable bilaterally. Posterior tibial pulses palpable bilaterally. Capillary refill normal to all digits.  No cyanosis or clubbing noted. Pedal hair growth normal.  Neurologic Normal speech. Oriented to person, place, and time. Epicritic sensation to light touch grossly present bilaterally.  Dermatologic Nails  well groomed and normal in appearance. No open wounds. No skin lesions.  Orthopedic: No POP L 5th Metatarsal.  Forefoot varus bilat    Radiographs: taken and reviewed interval healing of fracture. Fracture line still evident. Assessment:   1. Closed nondisplaced fracture of metatarsal bone of left foot with routine healing, unspecified metatarsal, subsequent encounter   2. Acquired forefoot varus of left foot   3. Acquired forefoot varus, right   4. Cavus foot, acquired    Plan:  Patient was evaluated and treated and all questions answered.  Fracture Left 5th metatarsal -No pain on exam today. -Continue WBAT in normal shoes  Forefoot Varus bilat, Cavus foot -Would benefit from custom molded orthotics to prevent further lateral column pressure which pre-disposes him to fracture.  No follow-ups on file.

## 2018-12-04 DIAGNOSIS — F419 Anxiety disorder, unspecified: Secondary | ICD-10-CM | POA: Diagnosis not present

## 2018-12-04 DIAGNOSIS — Z6832 Body mass index (BMI) 32.0-32.9, adult: Secondary | ICD-10-CM | POA: Diagnosis not present

## 2018-12-04 DIAGNOSIS — Z713 Dietary counseling and surveillance: Secondary | ICD-10-CM | POA: Diagnosis not present

## 2018-12-04 DIAGNOSIS — I10 Essential (primary) hypertension: Secondary | ICD-10-CM | POA: Diagnosis not present

## 2018-12-04 DIAGNOSIS — Z299 Encounter for prophylactic measures, unspecified: Secondary | ICD-10-CM | POA: Diagnosis not present

## 2018-12-07 ENCOUNTER — Telehealth: Payer: Self-pay | Admitting: Podiatry

## 2018-12-07 NOTE — Telephone Encounter (Signed)
Called pt to schedule an appt to pick up orthotics that came in. He asked if insurance covered them and I looked and [phone] went towards his dedcutible. He stated he would not be picking them up that if he would have know this before he would not have ordered them. I tried to tell him about the payment plan but he would not let me explain he said he would not be picking them up and then said by and hung up. There was a financial form signed in chart.

## 2018-12-09 NOTE — Telephone Encounter (Signed)
Ask Stephen Avila...but I would mail them to him, certified.  He signed ABN, it was explained his financial responsibility.

## 2019-01-04 NOTE — Telephone Encounter (Signed)
What did we do end up doing with these?

## 2019-05-05 ENCOUNTER — Emergency Department: Payer: BC Managed Care – PPO

## 2019-05-05 ENCOUNTER — Encounter: Payer: Self-pay | Admitting: Emergency Medicine

## 2019-05-05 ENCOUNTER — Other Ambulatory Visit: Payer: Self-pay

## 2019-05-05 ENCOUNTER — Inpatient Hospital Stay
Admission: EM | Admit: 2019-05-05 | Discharge: 2019-05-07 | DRG: 378 | Disposition: A | Discharge status: Home / Self Care | Payer: BC Managed Care – PPO | Attending: Internal Medicine | Admitting: Internal Medicine

## 2019-05-05 DIAGNOSIS — E860 Dehydration: Secondary | ICD-10-CM | POA: Diagnosis present

## 2019-05-05 DIAGNOSIS — R110 Nausea: Secondary | ICD-10-CM | POA: Diagnosis not present

## 2019-05-05 DIAGNOSIS — Z20828 Contact with and (suspected) exposure to other viral communicable diseases: Secondary | ICD-10-CM | POA: Diagnosis present

## 2019-05-05 DIAGNOSIS — I119 Hypertensive heart disease without heart failure: Secondary | ICD-10-CM | POA: Diagnosis present

## 2019-05-05 DIAGNOSIS — K921 Melena: Secondary | ICD-10-CM | POA: Diagnosis not present

## 2019-05-05 DIAGNOSIS — M549 Dorsalgia, unspecified: Secondary | ICD-10-CM | POA: Diagnosis present

## 2019-05-05 DIAGNOSIS — I959 Hypotension, unspecified: Secondary | ICD-10-CM | POA: Diagnosis not present

## 2019-05-05 DIAGNOSIS — D509 Iron deficiency anemia, unspecified: Secondary | ICD-10-CM | POA: Diagnosis present

## 2019-05-05 DIAGNOSIS — E876 Hypokalemia: Secondary | ICD-10-CM | POA: Diagnosis not present

## 2019-05-05 DIAGNOSIS — K5909 Other constipation: Secondary | ICD-10-CM | POA: Diagnosis not present

## 2019-05-05 DIAGNOSIS — I248 Other forms of acute ischemic heart disease: Secondary | ICD-10-CM | POA: Diagnosis present

## 2019-05-05 DIAGNOSIS — R778 Other specified abnormalities of plasma proteins: Secondary | ICD-10-CM | POA: Diagnosis not present

## 2019-05-05 DIAGNOSIS — Z7982 Long term (current) use of aspirin: Secondary | ICD-10-CM

## 2019-05-05 DIAGNOSIS — I708 Atherosclerosis of other arteries: Secondary | ICD-10-CM | POA: Diagnosis present

## 2019-05-05 DIAGNOSIS — D649 Anemia, unspecified: Secondary | ICD-10-CM | POA: Diagnosis not present

## 2019-05-05 DIAGNOSIS — R000 Tachycardia, unspecified: Secondary | ICD-10-CM | POA: Diagnosis present

## 2019-05-05 DIAGNOSIS — R0602 Shortness of breath: Secondary | ICD-10-CM | POA: Diagnosis not present

## 2019-05-05 DIAGNOSIS — K259 Gastric ulcer, unspecified as acute or chronic, without hemorrhage or perforation: Secondary | ICD-10-CM | POA: Diagnosis not present

## 2019-05-05 DIAGNOSIS — Z6831 Body mass index (BMI) 31.0-31.9, adult: Secondary | ICD-10-CM | POA: Diagnosis not present

## 2019-05-05 DIAGNOSIS — K269 Duodenal ulcer, unspecified as acute or chronic, without hemorrhage or perforation: Secondary | ICD-10-CM | POA: Diagnosis not present

## 2019-05-05 DIAGNOSIS — Z8249 Family history of ischemic heart disease and other diseases of the circulatory system: Secondary | ICD-10-CM

## 2019-05-05 DIAGNOSIS — K219 Gastro-esophageal reflux disease without esophagitis: Secondary | ICD-10-CM | POA: Diagnosis not present

## 2019-05-05 DIAGNOSIS — K315 Obstruction of duodenum: Secondary | ICD-10-CM | POA: Diagnosis not present

## 2019-05-05 DIAGNOSIS — F17290 Nicotine dependence, other tobacco product, uncomplicated: Secondary | ICD-10-CM | POA: Diagnosis present

## 2019-05-05 DIAGNOSIS — R946 Abnormal results of thyroid function studies: Secondary | ICD-10-CM | POA: Diagnosis present

## 2019-05-05 DIAGNOSIS — K922 Gastrointestinal hemorrhage, unspecified: Secondary | ICD-10-CM | POA: Diagnosis not present

## 2019-05-05 DIAGNOSIS — D62 Acute posthemorrhagic anemia: Secondary | ICD-10-CM | POA: Diagnosis present

## 2019-05-05 DIAGNOSIS — N200 Calculus of kidney: Secondary | ICD-10-CM | POA: Diagnosis present

## 2019-05-05 DIAGNOSIS — E669 Obesity, unspecified: Secondary | ICD-10-CM | POA: Diagnosis not present

## 2019-05-05 DIAGNOSIS — Z79891 Long term (current) use of opiate analgesic: Secondary | ICD-10-CM

## 2019-05-05 DIAGNOSIS — Z87442 Personal history of urinary calculi: Secondary | ICD-10-CM

## 2019-05-05 DIAGNOSIS — K2960 Other gastritis without bleeding: Secondary | ICD-10-CM | POA: Diagnosis not present

## 2019-05-05 DIAGNOSIS — M25569 Pain in unspecified knee: Secondary | ICD-10-CM | POA: Diagnosis present

## 2019-05-05 DIAGNOSIS — G8929 Other chronic pain: Secondary | ICD-10-CM | POA: Diagnosis not present

## 2019-05-05 DIAGNOSIS — F419 Anxiety disorder, unspecified: Secondary | ICD-10-CM | POA: Diagnosis not present

## 2019-05-05 DIAGNOSIS — I951 Orthostatic hypotension: Secondary | ICD-10-CM | POA: Diagnosis present

## 2019-05-05 DIAGNOSIS — Z9049 Acquired absence of other specified parts of digestive tract: Secondary | ICD-10-CM

## 2019-05-05 DIAGNOSIS — R748 Abnormal levels of other serum enzymes: Secondary | ICD-10-CM | POA: Insufficient documentation

## 2019-05-05 DIAGNOSIS — R011 Cardiac murmur, unspecified: Secondary | ICD-10-CM | POA: Diagnosis present

## 2019-05-05 DIAGNOSIS — Z79899 Other long term (current) drug therapy: Secondary | ICD-10-CM

## 2019-05-05 DIAGNOSIS — R933 Abnormal findings on diagnostic imaging of other parts of digestive tract: Secondary | ICD-10-CM | POA: Diagnosis not present

## 2019-05-05 DIAGNOSIS — R1111 Vomiting without nausea: Secondary | ICD-10-CM | POA: Diagnosis not present

## 2019-05-05 LAB — BASIC METABOLIC PANEL
Anion gap: 11 (ref 5–15)
BUN: 63 mg/dL — ABNORMAL HIGH (ref 6–20)
CO2: 19 mmol/L — ABNORMAL LOW (ref 22–32)
Calcium: 8.1 mg/dL — ABNORMAL LOW (ref 8.9–10.3)
Chloride: 110 mmol/L (ref 98–111)
Creatinine, Ser: 0.99 mg/dL (ref 0.61–1.24)
GFR calc Af Amer: >60 mL/min (ref >60)
GFR calc non Af Amer: >60 mL/min (ref >60)
Glucose, Bld: 118 mg/dL — ABNORMAL HIGH (ref 70–99)
Potassium: 4.2 mmol/L (ref 3.5–5.1)
Sodium: 140 mmol/L (ref 135–145)

## 2019-05-05 LAB — TROPONIN I (HIGH SENSITIVITY)
Troponin I (High Sensitivity): 82 ng/L — ABNORMAL HIGH (ref <18)
Troponin I (High Sensitivity): 128 ng/L (ref <18)

## 2019-05-05 LAB — IRON AND TIBC
Iron: 56 ug/dL (ref 45–182)
Saturation Ratios: 19 % (ref 17.9–39.5)
TIBC: 302 ug/dL (ref 250–450)
UIBC: 246 ug/dL

## 2019-05-05 LAB — LIPASE, BLOOD: Lipase: 30 U/L (ref 11–51)

## 2019-05-05 LAB — SALICYLATE LEVEL: Salicylate Lvl: <7 mg/dL (ref 2.8–30.0)

## 2019-05-05 LAB — TYPE AND SCREEN
ABO/RH(D): A POS
Antibody Screen: NEGATIVE

## 2019-05-05 LAB — APTT: aPTT: 27 seconds (ref 24–36)

## 2019-05-05 LAB — CBC
HCT: 40.6 % (ref 39.0–52.0)
Hemoglobin: 13.5 g/dL (ref 13.0–17.0)
MCH: 28.7 pg (ref 26.0–34.0)
MCHC: 33.3 g/dL (ref 30.0–36.0)
MCV: 86.4 fL (ref 80.0–100.0)
Platelets: 317 10*3/uL (ref 150–400)
RBC: 4.7 MIL/uL (ref 4.22–5.81)
RDW: 13.8 % (ref 11.5–15.5)
WBC: 12.8 10*3/uL — ABNORMAL HIGH (ref 4.0–10.5)
nRBC: 0 % (ref 0.0–0.2)

## 2019-05-05 LAB — PROTIME-INR
INR: 1.4 — ABNORMAL HIGH (ref 0.8–1.2)
Prothrombin Time: 16.5 seconds — ABNORMAL HIGH (ref 11.4–15.2)

## 2019-05-05 LAB — FERRITIN: Ferritin: 14 ng/mL — ABNORMAL LOW (ref 24–336)

## 2019-05-05 LAB — HEPATIC FUNCTION PANEL
ALT: 18 U/L (ref 0–44)
AST: 19 U/L (ref 15–41)
Albumin: 2.7 g/dL — ABNORMAL LOW (ref 3.5–5.0)
Alkaline Phosphatase: 37 U/L — ABNORMAL LOW (ref 38–126)
Bilirubin, Direct: <0.1 mg/dL (ref 0.0–0.2)
Total Bilirubin: 0.5 mg/dL (ref 0.3–1.2)
Total Protein: 4.9 g/dL — ABNORMAL LOW (ref 6.5–8.1)

## 2019-05-05 LAB — T4, FREE: Free T4: 0.85 ng/dL (ref 0.61–1.12)

## 2019-05-05 LAB — HEMOGLOBIN
Hemoglobin: 11.6 g/dL — ABNORMAL LOW (ref 13.0–17.0)
Hemoglobin: 9.8 g/dL — ABNORMAL LOW (ref 13.0–17.0)

## 2019-05-05 LAB — FOLATE: Folate: 12.6 ng/mL (ref >5.9)

## 2019-05-05 LAB — TSH: TSH: 0.053 u[IU]/mL — ABNORMAL LOW (ref 0.350–4.500)

## 2019-05-05 LAB — SARS CORONAVIRUS 2 BY RT PCR (HOSPITAL ORDER, PERFORMED IN ~~LOC~~ HOSPITAL LAB): SARS Coronavirus 2: NEGATIVE

## 2019-05-05 MED ORDER — SODIUM CHLORIDE 0.9 % IV BOLUS
1000.0000 mL | Freq: Once | INTRAVENOUS | Status: AC
  Administered 2019-05-05: 09:00:00 1000 mL via INTRAVENOUS

## 2019-05-05 MED ORDER — ACETAMINOPHEN 650 MG RE SUPP: 650.0000 mg | Freq: Four times a day (QID) | RECTAL | Status: DC | PRN

## 2019-05-05 MED ORDER — ONDANSETRON HCL 4 MG PO TABS: 4.0000 mg | ORAL_TABLET | Freq: Four times a day (QID) | ORAL | Status: DC | PRN

## 2019-05-05 MED ORDER — SODIUM CHLORIDE 0.9 % IV SOLN
8.0000 mg/h | INTRAVENOUS | Status: DC
  Administered 2019-05-05 – 2019-05-07 (×5): 8 mg/h via INTRAVENOUS
  Filled 2019-05-05 (×5): qty 80

## 2019-05-05 MED ORDER — ACETAMINOPHEN 325 MG PO TABS
650.0000 mg | ORAL_TABLET | Freq: Four times a day (QID) | ORAL | Status: DC | PRN
  Administered 2019-05-06: 11:00:00 650 mg via ORAL
  Filled 2019-05-05: qty 2

## 2019-05-05 MED ORDER — LORAZEPAM 2 MG/ML IJ SOLN
1.0000 mg | Freq: Once | INTRAMUSCULAR | Status: AC
  Administered 2019-05-05: 09:00:00 1 mg via INTRAVENOUS
  Filled 2019-05-05: qty 1

## 2019-05-05 MED ORDER — ONDANSETRON HCL 4 MG/2ML IJ SOLN
4.0000 mg | Freq: Four times a day (QID) | INTRAMUSCULAR | Status: DC | PRN
  Administered 2019-05-05 – 2019-05-06 (×2): 4 mg via INTRAVENOUS
  Filled 2019-05-05 (×2): qty 2

## 2019-05-05 MED ORDER — POLYETHYLENE GLYCOL 3350 17 G PO PACK
17.0000 g | PACK | Freq: Two times a day (BID) | ORAL | Status: DC
  Administered 2019-05-06 (×2): 17 g via ORAL
  Filled 2019-05-05 (×2): qty 1

## 2019-05-05 MED ORDER — SODIUM CHLORIDE 0.9 % IV BOLUS
1000.0000 mL | Freq: Once | INTRAVENOUS | Status: AC
  Administered 2019-05-05: 08:00:00 1000 mL via INTRAVENOUS

## 2019-05-05 MED ORDER — SODIUM CHLORIDE 0.9 % IV SOLN
80.0000 mg | Freq: Once | INTRAVENOUS | Status: AC
  Administered 2019-05-05: 12:00:00 80 mg via INTRAVENOUS
  Filled 2019-05-05: qty 80

## 2019-05-05 MED ORDER — SODIUM CHLORIDE 0.9 % IV BOLUS
500.0000 mL | Freq: Once | INTRAVENOUS | Status: AC
  Administered 2019-05-05: 13:00:00 500 mL via INTRAVENOUS

## 2019-05-05 MED ORDER — BUPRENORPHINE HCL-NALOXONE HCL 8-2 MG SL SUBL
1.0000 | SUBLINGUAL_TABLET | Freq: Every day | SUBLINGUAL | Status: DC
  Administered 2019-05-05 – 2019-05-07 (×3): 1 via SUBLINGUAL
  Filled 2019-05-05 (×3): qty 1

## 2019-05-05 MED ORDER — PANTOPRAZOLE SODIUM 40 MG IV SOLR: 40.0000 mg | Freq: Two times a day (BID) | INTRAVENOUS | Status: DC

## 2019-05-05 MED ORDER — SODIUM CHLORIDE 0.9 % IV SOLN
INTRAVENOUS | Status: DC
  Administered 2019-05-05 – 2019-05-06 (×3): via INTRAVENOUS

## 2019-05-05 MED ORDER — PANTOPRAZOLE SODIUM 40 MG IV SOLR
40.0000 mg | Freq: Once | INTRAVENOUS | Status: DC
  Filled 2019-05-05: qty 40

## 2019-05-05 MED ORDER — IOHEXOL 350 MG/ML SOLN
100.0000 mL | Freq: Once | INTRAVENOUS | Status: AC | PRN
  Administered 2019-05-05: 100 mL via INTRAVENOUS

## 2019-05-05 NOTE — Consult Note (Signed)
Cephas Darby, MD [address]  [place]  [place], [place] [address]  Main: [phone]  Fax: [phone] Pager: [phone]   Consultation  Referring Provider:     No ref. provider found Primary Care Physician:  Patient, No Pcp Per Primary Gastroenterologist: Althia Forts        Reason for Consultation:   Melena  Date of Admission:  05/05/2019 Date of Consultation:  05/05/2019         HPI:   Stephen Avila is a 43 y.o. male with no significant past medical history other than chronic constipation and anxiety presented to ER yesterday with severe symptomatic anemia, nausea and nonbloody vomiting, several episodes of black bowel movements.  In the ER, he was diaphoretic, tachycardic to 160s, anxious.  He received Ativan, IV fluids.  He was also found to have elevated troponin to 78.  Apparently, patient admitted to taking BC powder for chronic low back pain several times a day over 5 years.  His labs revealed hemoglobin 11.6 today, it was 13.5 on arrival., elevated BUN/creatinine Ratio, 63/0.99.  Baseline creatinine normal and last normal hemoglobin was 17 in 06/2015. Patient underwent CT angio abdomen and pelvis with and without contrast did not reveal active bleeding into GI tract.  There was an area of focal persistent nondistended and possible circumferential mild wall thickening in the descending/sigmoid junction of the colon  Patient reports severe constipation for long time and has been taking Linzess 145 MCG about 2-3 times a week along with Dulcolax.  He said he is not tolerating Linzess due to severe abdominal cramps.  He is consumes red meat regularly.  He has history of hyperhidrosis and has seen dermatologist in the past.  He thinks he stays dehydrated due to perspiration and therefore not helping with constipation  When I saw the patient in the ER, he denied abdominal pain, nausea or vomiting.  He reports feeling significantly better.  He denies chest pain,  lightheadedness, shortness of breath.  Patient is evaluated by cardiology and 2D echo is pending. Heart rate is in low 100s  Patient works for [employer]  He does not drink or smoke NSAIDs: BC powders/Goody powder for back pain  Antiplts/Anticoagulants/Anti thrombotics: None  GI Procedures: Reports having had a colonoscopy about 5 years ago because of chronic constipation He denies family history of GI malignancy  Past Medical History:  Diagnosis Date   Anxiety    Constipation    GERD (gastroesophageal reflux disease)    Hypertension    Kidney stones    Kidney stones     Past Surgical History:  Procedure Laterality Date   CHOLECYSTECTOMY N/A 06/26/2015   Procedure: LAPAROSCOPIC CHOLECYSTECTOMY;  Surgeon: [name], MD;  Location: [facility];  Service: General;  Laterality: N/A;   HERNIA REPAIR     x 2    LITHOTRIPSY      Prior to Admission medications   Medication Sig Start Date End Date Taking? Authorizing Provider  amLODipine (NORVASC) 10 MG tablet TAKE 1 TABLET BY MOUTH EVERYDAY AT BEDTIME 10/14/18  Yes [provider]  buprenorphine (SUBUTEX) 8 MG SUBL SL tablet Place 20 mg under the tongue daily.  09/22/18  Yes [provider]  testosterone cypionate (DEPOTESTOSTERONE CYPIONATE) 200 MG/ML injection INJECT 1 ML EVERY 2 WEEKS 10/15/18  Yes [provider]    Current Facility-Administered Medications:    0.9 %  sodium chloride infusion, , Intravenous, Continuous, Leslye Peer, Richard, MD, Last Rate: 100  mL/hr at 05/05/19 1458   acetaminophen (TYLENOL) tablet 650 mg, 650 mg, Oral, Q6H PRN **OR** acetaminophen (TYLENOL) suppository 650 mg, 650 mg, Rectal, Q6H PRN, Leslye Peer, Richard, MD   buprenorphine-naloxone (SUBOXONE) 8-2 mg per SL tablet 1 tablet, 1 tablet, Sublingual, Daily, Wieting, Richard, MD, 1 tablet at 05/05/19 1326   ondansetron (ZOFRAN) tablet 4 mg, 4 mg, Oral, Q6H PRN **OR** ondansetron (ZOFRAN) injection 4 mg, 4 mg,  Intravenous, Q6H PRN, Leslye Peer, Richard, MD, 4 mg at 05/05/19 1315   pantoprazole (PROTONIX) 80 mg in sodium chloride 0.9 % 250 mL (0.32 mg/mL) infusion, 8 mg/hr, Intravenous, Continuous, Wieting, Richard, MD, Last Rate: 25 mL/hr at 05/05/19 1326, 8 mg/hr at 05/05/19 1326   [START ON 05/08/2019] pantoprazole (PROTONIX) injection 40 mg, 40 mg, Intravenous, Q12H, Wieting, Richard, MD   polyethylene glycol (MIRALAX / GLYCOLAX) packet 17 g, 17 g, Oral, BID, Granvil Djordjevic, Tally Due, MD  Current Outpatient Medications:    amLODipine (NORVASC) 10 MG tablet, TAKE 1 TABLET BY MOUTH EVERYDAY AT BEDTIME, Disp: , Rfl:    buprenorphine (SUBUTEX) 8 MG SUBL SL tablet, Place 20 mg under the tongue daily. , Disp: , Rfl:    testosterone cypionate (DEPOTESTOSTERONE CYPIONATE) 200 MG/ML injection, INJECT 1 ML EVERY 2 WEEKS, Disp: , Rfl:   Family History  Problem Relation Age of Onset   COPD Mother    Heart failure Father    Thyroid disease Neg Hx      Social History   Tobacco Use   Smoking status: Never Smoker   Smokeless tobacco: Current User    Types: Snuff  Substance Use Topics   Alcohol use: No   Drug use: No    Allergies as of 05/05/2019   (No Known Allergies)    Review of Systems:    All systems reviewed and negative except where noted in HPI.   Physical Exam:  Vital signs in last 24 hours: Temp:  [98.8 F (37.1 C)] 98.8 F (37.1 C) (10/28 0711) Pulse Rate:  [89-134] 113 (10/28 1700) Resp:  [11-23] 23 (10/28 1700) BP: (97-127)/(65-87) 127/80 (10/28 1700) SpO2:  [95 %-98 %] 98 % (10/28 1700)   General:   Pleasant, cooperative in NAD Head:  Normocephalic and atraumatic. Eyes:   No icterus.   Conjunctiva pink. PERRLA. Ears:  Normal auditory acuity. Neck:  Supple; no masses or thyroidomegaly Lungs: Respirations even and unlabored. Lungs clear to auscultation bilaterally.   No wheezes, crackles, or rhonchi.  Heart:  Regular rate and rhythm;  Without murmur, clicks, rubs or  gallops Abdomen:  Soft, nondistended, nontender. Normal bowel sounds. No appreciable masses or hepatomegaly.  No rebound or guarding.  Rectal:  Not performed. Msk:  Symmetrical without gross deformities.  Strength normal Extremities:  Without edema, cyanosis or clubbing. Neurologic:  Alert and oriented x3;  grossly normal neurologically. Skin:  Intact without significant lesions or rashes. Psych:  Alert and cooperative. Normal affect.  LAB RESULTS: CBC Latest Ref Rng & Units 05/05/2019 05/05/2019 06/10/2015  WBC 4.0 - 10.5 K/uL - 12.8(H) 10.0  Hemoglobin 13.0 - 17.0 g/dL 11.6(L) 13.5 17.0  Hematocrit 39.0 - 52.0 % - 40.6 51.2  Platelets 150 - 400 K/uL - 317 316    BMET BMP Latest Ref Rng & Units 05/05/2019 06/21/2015 06/10/2015  Glucose 70 - 99 mg/dL 118(H) 104(H) 115(H)  BUN 6 - 20 mg/dL 63(H) 23(H) 16  Creatinine 0.61 - 1.24 mg/dL 0.99 1.19 1.16  Sodium 135 - 145 mmol/L 140 137 141  Potassium 3.5 -  5.1 mmol/L 4.2 3.4(L) 3.8  Chloride 98 - 111 mmol/L 110 97(L) 102  CO2 22 - 32 mmol/L 19(L) 31 30  Calcium 8.9 - 10.3 mg/dL 8.1(L) 9.8 9.7    LFT Hepatic Function Latest Ref Rng & Units 05/05/2019 06/21/2015 06/10/2015  Total Protein 6.5 - 8.1 g/dL 4.9(L) 7.4 8.1  Albumin 3.5 - 5.0 g/dL 2.7(L) 4.2 4.5  AST 15 - 41 U/L '19 30 24  ' ALT 0 - 44 U/L '18 27 26  ' Alk Phosphatase 38 - 126 U/L 37(L) 71 70  Total Bilirubin 0.3 - 1.2 mg/dL 0.5 0.5 0.1(L)  Bilirubin, Direct 0.0 - 0.2 mg/dL <0.1 0.2 -     STUDIES: Dg Chest 2 View  Result Date: 05/05/2019 CLINICAL DATA:  Tachycardia. EXAM: CHEST - 2 VIEW COMPARISON:  None available currently. FINDINGS: The heart size and mediastinal contours are within normal limits. Both lungs are clear. No pneumothorax or pleural effusion is noted. The visualized skeletal structures are unremarkable. IMPRESSION: No active cardiopulmonary disease. Electronically Signed   By: Marijo Conception M.D.   On: 05/05/2019 07:47   Ct Angio Abd/pel W And/or Wo  Contrast  Result Date: 05/05/2019 CLINICAL DATA:  Acute GI bleed, diarrhea. History of nephrolithiasis, cholecystectomy. EXAM: CTA ABDOMEN AND PELVIS WITHOUT AND WITH CONTRAST TECHNIQUE: Multidetector CT imaging of the abdomen and pelvis was performed using the standard protocol during bolus administration of intravenous contrast. Multiplanar reconstructed images and MIPs were obtained and reviewed to evaluate the vascular anatomy. CONTRAST:  164m OMNIPAQUE IOHEXOL 350 MG/ML SOLN COMPARISON:  06/11/2015 FINDINGS: VASCULAR Aorta: Normal caliber aorta without aneurysm, dissection, vasculitis or significant stenosis. Celiac: Patent without evidence of aneurysm, dissection, vasculitis or significant stenosis. SMA: Patent without evidence of aneurysm, dissection, vasculitis or significant stenosis. Renals: Both renal arteries are patent without evidence of aneurysm, dissection, vasculitis, fibromuscular dysplasia or significant stenosis. IMA: Patent without evidence of aneurysm, dissection, vasculitis or significant stenosis. Inflow: Minimal calcified plaque in the common iliac arteries. No aneurysm, dissection, or stenosis. Proximal Outflow: Bilateral common femoral and visualized portions of the superficial and profunda femoral arteries are patent without evidence of aneurysm, dissection, vasculitis or significant stenosis. Veins: Patent hepatic veins, portal vein, SMV, splenic vein, bilateral renal veins. IVC and iliac venous system unremarkable. No venous pathology evident. Review of the MIP images confirms the above findings. NON-VASCULAR Lower chest: No acute abnormality. Hepatobiliary: No focal liver abnormality is seen. Status post cholecystectomy. No biliary dilatation. Pancreas: Unremarkable. No pancreatic ductal dilatation or surrounding inflammatory changes. Spleen: Normal in size without focal abnormality. Adrenals/Urinary Tract: Normal adrenals. Subcentimeter probable cyst, mid right kidney. 4 mm  calculus, lower pole right renal collecting system. No hydronephrosis or ureterectasis. Urinary bladder incompletely distended. Stomach/Bowel: Stomach is distended by ingested material. The small bowel is nondilated. Normal appendix. The colon is nondilated. Short-segment of incomplete distension and possible wall thickening near the descending/sigmoid junction on all 3 phases of the scan. No evidence of active hemorrhage into the bowel. Lymphatic: No abdominal or pelvic adenopathy. Reproductive: Mild prostate enlargement with central coarse calcification. Other: Bilateral pelvic phleboliths. No ascites. No free air. Musculoskeletal: Advanced degenerative disc disease L4-5, and mild spondylitic changes L5-S1. No fracture or other acute bone abnormality. IMPRESSION: 1. No active bleeding  into bowel. 2. Right nephrolithiasis without hydronephrosis. 3. Focal persistent area of nondistention and possible circumferential mild wall thickening at the descending/sigmoid junction of the colon. Follow-up elective colonoscopy or BE may be useful to exclude mucosal lesion. 4. Mild iliac atherosclerosis (  ICD10-170.0). Electronically Signed   By: Lucrezia Europe M.D.   On: 05/05/2019 09:55      Impression / Plan:   Stephen Avila is a 43 y.o. male with history of chronic constipation, long-term heavy NSAID use for chronic pain, anxiety presented with severe symptomatic anemia and hematochezia.  BUN disproportionately elevated compared to creatinine, suggestive of acute upper GI bleed.  CT angio abdomen and pelvis did not reveal active bleeding source however, possible mass lesion in descending/sigmoid junction.  He also had elevated troponin, likely demand ischemia in setting of  GI bleed.  Hematochezia Most likely upper GI bleed Continue pantoprazole drip Okay with full liquid diet Recommend EGD after clearance by cardiology 2D echo pending Monitor CBC every 6-8 hours, transfuse to maintain hemoglobin greater than  8 Check iron studies, B12 and folate panel Fluid resuscitation to normalize uremia in setting of GI bleed  Abnormal CT of the descending/sigmoid junction Recommend colonoscopy along with EGD to evaluate for malignancy Bowel prep after clearance from cardiology  Thank you for involving me in the care of this patient.  We will follow along with you    LOS: 0 days   Sherri Sear, MD  05/05/2019, 5:19 PM   Note: This dictation was prepared with Dragon dictation along with smaller phrase technology. Any transcriptional errors that result from this process are unintentional.

## 2019-05-05 NOTE — ED Notes (Signed)
Unable to call report at this time due to room not ready and nurse in report.

## 2019-05-05 NOTE — ED Triage Notes (Signed)
Patient from home via [facility]. Patient reports having some constipation for the last 2-3 days. States he took some over the counter meds. Since then has had nausea and several episodes of diarrhea. Patient states this morning he woke up sweating and with some confusion. Patient resting HR 130s. Per EMS, when changing position, HR increases to 160s.  Given 4 mg zofran, 750 ml NS PTA.

## 2019-05-05 NOTE — ED Notes (Signed)
Patient's girlfriend left to see to their dogs. Patient is alert and oriented. Patient was given ice water per request.

## 2019-05-05 NOTE — ED Provider Notes (Signed)
[facility] Emergency Department Provider Note  ____________________________________________  Time seen: Approximately 9:04 AM  I have reviewed the triage vital signs and the nursing notes.   HISTORY  Chief Complaint Tachycardia and Nausea    HPI Stephen Avila is a 43 y.o. male with a past medical history of anxiety constipation GERD hypertension and kidney stones who comes the ED complaining of constipation for the last 3 days.  He took some laxatives after which he has been having dark runny stools for the last 2 days.  He gets lightheaded and short of breath when he stands up and tries to walk.  He has been noted to have a heart rate as high as 170 bpm at home.  Denies chest pain.  Does report generalized abdominal pain which is nonradiating without aggravating or alleviating factors.  He notes that he takes aspirin powder for pain multiple times a day over a long period of time.    Past Medical History:  Diagnosis Date  . Anxiety   . Constipation   . GERD (gastroesophageal reflux disease)   . Hypertension   . Kidney stones   . Kidney stones      Patient Active Problem List   Diagnosis Date Noted  . Upper GI bleed 05/05/2019  . 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 Diaphoresis 08/31/2015  . Hyperthyroidism 08/30/2015  . 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 Kidney stone 12/09/2013  . Unspecified constipation 08/20/2013     Past Surgical History:  Procedure Laterality Date  . CHOLECYSTECTOMY N/A 06/26/2015   Procedure: LAPAROSCOPIC CHOLECYSTECTOMY;  Surgeon: [name], MD;  Location: [facility];  Service: General;  Laterality: N/A;  . HERNIA REPAIR     x 2   . LITHOTRIPSY       Prior to Admission medications   Medication Sig Start Date End Date Taking? Authorizing Provider  amLODipine (NORVASC) 10 MG tablet TAKE 1 TABLET BY MOUTH EVERYDAY AT BEDTIME 10/14/18    [provider]  buprenorphine (SUBUTEX) 8 MG SUBL SL tablet TAKE 2.5 TABLETS EVERY DAY 09/22/18   [provider]  HYDROcodone-acetaminophen (NORCO) 5-325 MG tablet Take 1 tablet by mouth every 6 (six) hours as needed for moderate pain. 09/19/18   Evelina Bucy, DPM  losartan-hydrochlorothiazide (HYZAAR) 100-25 MG tablet Take 1 tablet by mouth daily. 10/14/18   [provider]  testosterone cypionate (DEPOTESTOSTERONE CYPIONATE) 200 MG/ML injection INJECT 1 ML EVERY 2 WEEKS 10/15/18   [provider]     Allergies Patient has no known allergies.   Family History  Problem Relation Age of Onset  . Thyroid disease Neg Hx     Social History Social History   Tobacco Use  . Smoking status: Never Smoker  . Smokeless tobacco: Current User    Types: Snuff  Substance Use Topics  . Alcohol use: No  . Drug use: No    Review of Systems  Constitutional:   No fever or chills.  ENT:   No sore throat. No rhinorrhea. Cardiovascular:   No chest pain or syncope. Respiratory:   No dyspnea or cough. Gastrointestinal:   Positive as above for abdominal pain and constipation/diarrhea. Musculoskeletal:   Negative for focal pain or swelling All other systems reviewed and are negative except as documented above in ROS and HPI.  ____________________________________________   PHYSICAL EXAM:  VITAL SIGNS: ED Triage Vitals  Enc Vitals Group     BP 05/05/19 0711  117/87     Pulse Rate 05/05/19 0711 (!) 134     Resp 05/05/19 0711 17     Temp 05/05/19 0711 98.8 F (37.1 C)     Temp Source 05/05/19 0711 Oral     SpO2 05/05/19 0711 98 %     Weight --      Height --      Head Circumference --      Peak Flow --      Pain Score 05/05/19 0710 0     Pain Loc --      Pain Edu? --      Excl. in [place]? --     Vital signs reviewed, nursing assessments reviewed.   Constitutional:   Alert and oriented. Non-toxic appearance.  Anxious appearing Eyes:   Conjunctivae are  normal. EOMI. PERRL. ENT      Head:   Normocephalic and atraumatic.      Nose:   Wearing a mask.      Mouth/Throat:   Wearing a mask.      Neck:   No meningismus. Full ROM. Hematological/Lymphatic/Immunilogical:   No cervical lymphadenopathy. Cardiovascular:   Tachycardia heart rate 150.  Sinus tachycardia on the monitor.  Increases to 170 when patient sits upright.. Symmetric bilateral radial and DP pulses.  No murmurs. Cap refill less than 2 seconds. Respiratory:   Normal respiratory effort without tachypnea/retractions. Breath sounds are clear and equal bilaterally. No wheezes/rales/rhonchi. Gastrointestinal:   Soft and nontender. Non distended. There is no CVA tenderness.  No rebound, rigidity, or guarding.  Rectal exam negative for engorged hemorrhoids.  There is melanotic stool, strongly Hemoccult positive. Musculoskeletal:   Normal range of motion in all extremities. No joint effusions.  No lower extremity tenderness.  No edema. Neurologic:   Normal speech and language.  Motor grossly intact. No acute focal neurologic deficits are appreciated.  Skin:    Skin is warm, dry and intact. No rash noted.  No petechiae, purpura, or bullae.  ____________________________________________    LABS (pertinent positives/negatives) (all labs ordered are listed, but only abnormal results are displayed) Labs Reviewed  CBC - Abnormal; Notable for the following components:      Result Value   WBC 12.8 (*)    All other components within normal limits  BASIC METABOLIC PANEL - Abnormal; Notable for the following components:   CO2 19 (*)    Glucose, Bld 118 (*)    BUN 63 (*)    Calcium 8.1 (*)    All other components within normal limits  PROTIME-INR - Abnormal; Notable for the following components:   Prothrombin Time 16.5 (*)    INR 1.4 (*)    All other components within normal limits  TSH - Abnormal; Notable for the following components:   TSH 0.053 (*)    All other components within normal  limits  TROPONIN I (HIGH SENSITIVITY) - Abnormal; Notable for the following components:   Troponin I (High Sensitivity) 82 (*)    All other components within normal limits  SARS CORONAVIRUS 2 (TAT 6-24 HRS)  APTT  T4, FREE  SALICYLATE LEVEL  HEPATIC FUNCTION PANEL  LIPASE, BLOOD  TYPE AND SCREEN  TROPONIN I (HIGH SENSITIVITY)   ____________________________________________   EKG    ____________________________________________    RADIOLOGY  Dg Chest 2 View  Result Date: 05/05/2019 CLINICAL DATA:  Tachycardia. EXAM: CHEST - 2 VIEW COMPARISON:  None available currently. FINDINGS: The heart size and mediastinal contours are within normal limits. Both lungs  are clear. No pneumothorax or pleural effusion is noted. The visualized skeletal structures are unremarkable. IMPRESSION: No active cardiopulmonary disease. Electronically Signed   By: Marijo Conception M.D.   On: 05/05/2019 07:47   Ct Angio Abd/pel W And/or Wo Contrast  Result Date: 05/05/2019 CLINICAL DATA:  Acute GI bleed, diarrhea. History of nephrolithiasis, cholecystectomy. EXAM: CTA ABDOMEN AND PELVIS WITHOUT AND WITH CONTRAST TECHNIQUE: Multidetector CT imaging of the abdomen and pelvis was performed using the standard protocol during bolus administration of intravenous contrast. Multiplanar reconstructed images and MIPs were obtained and reviewed to evaluate the vascular anatomy. CONTRAST:  143mL OMNIPAQUE IOHEXOL 350 MG/ML SOLN COMPARISON:  06/11/2015 FINDINGS: VASCULAR Aorta: Normal caliber aorta without aneurysm, dissection, vasculitis or significant stenosis. Celiac: Patent without evidence of aneurysm, dissection, vasculitis or significant stenosis. SMA: Patent without evidence of aneurysm, dissection, vasculitis or significant stenosis. Renals: Both renal arteries are patent without evidence of aneurysm, dissection, vasculitis, fibromuscular dysplasia or significant stenosis. IMA: Patent without evidence of aneurysm,  dissection, vasculitis or significant stenosis. Inflow: Minimal calcified plaque in the common iliac arteries. No aneurysm, dissection, or stenosis. Proximal Outflow: Bilateral common femoral and visualized portions of the superficial and profunda femoral arteries are patent without evidence of aneurysm, dissection, vasculitis or significant stenosis. Veins: Patent hepatic veins, portal vein, SMV, splenic vein, bilateral renal veins. IVC and iliac venous system unremarkable. No venous pathology evident. Review of the MIP images confirms the above findings. NON-VASCULAR Lower chest: No acute abnormality. Hepatobiliary: No focal liver abnormality is seen. Status post cholecystectomy. No biliary dilatation. Pancreas: Unremarkable. No pancreatic ductal dilatation or surrounding inflammatory changes. Spleen: Normal in size without focal abnormality. Adrenals/Urinary Tract: Normal adrenals. Subcentimeter probable cyst, mid right kidney. 4 mm calculus, lower pole right renal collecting system. No hydronephrosis or ureterectasis. Urinary bladder incompletely distended. Stomach/Bowel: Stomach is distended by ingested material. The small bowel is nondilated. Normal appendix. The colon is nondilated. Short-segment of incomplete distension and possible wall thickening near the descending/sigmoid junction on all 3 phases of the scan. No evidence of active hemorrhage into the bowel. Lymphatic: No abdominal or pelvic adenopathy. Reproductive: Mild prostate enlargement with central coarse calcification. Other: Bilateral pelvic phleboliths. No ascites. No free air. Musculoskeletal: Advanced degenerative disc disease L4-5, and mild spondylitic changes L5-S1. No fracture or other acute bone abnormality. IMPRESSION: 1. No active bleeding  into bowel. 2. Right nephrolithiasis without hydronephrosis. 3. Focal persistent area of nondistention and possible circumferential mild wall thickening at the descending/sigmoid junction of the  colon. Follow-up elective colonoscopy or BE may be useful to exclude mucosal lesion. 4. 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 Electronically Signed   By: Lucrezia Europe M.D.   On: 05/05/2019 09:55    ____________________________________________   PROCEDURES .Critical Care Performed by: Carrie Mew, MD Authorized by: Carrie Mew, MD   Critical care provider statement:    Critical care time (minutes):  35   Critical care time was exclusive of:  Separately billable procedures and treating other patients   Critical care was necessary to treat or prevent imminent or life-threatening deterioration of the following conditions:  Circulatory failure and shock   Critical care was time spent personally by me on the following activities:  Development of treatment plan with patient or surrogate, discussions with consultants, evaluation of patient's response to treatment, examination of patient, obtaining history from patient or surrogate, ordering and performing treatments and interventions, ordering and review of laboratory studies, ordering and review of radiographic studies, pulse oximetry, re-evaluation of patient's condition  and review of old charts    ____________________________________________  DIFFERENTIAL DIAGNOSIS   GI bleed, bowel perforation, obstruction  CLINICAL IMPRESSION / ASSESSMENT AND PLAN / ED COURSE  Medications ordered in the ED: Medications  pantoprazole (PROTONIX) injection 40 mg (has no administration in time range)  sodium chloride 0.9 % bolus 1,000 mL (0 mLs Intravenous Stopped 05/05/19 0855)  sodium chloride 0.9 % bolus 1,000 mL (1,000 mLs Intravenous New Bag/Given 05/05/19 0858)  LORazepam (ATIVAN) injection 1 mg (1 mg Intravenous Given 05/05/19 0850)  iohexol (OMNIPAQUE) 350 MG/ML injection 100 mL (100 mLs Intravenous Contrast Given 05/05/19 [id number])    Pertinent labs & imaging results that were available during my care of the patient were reviewed by me  and considered in my medical decision making (see chart for details).  COURTNEY REIFSNYDER was evaluated in Emergency Department on 05/05/2019 for the symptoms described in the history of present illness. He was evaluated in the context of the global COVID-19 pandemic, which necessitated consideration that the patient might be at risk for infection with the SARS-CoV-2 virus that causes COVID-19. Institutional protocols and algorithms that pertain to the evaluation of patients at risk for COVID-19 are in a state of rapid change based on information released by regulatory bodies including the CDC and federal and state organizations. These policies and algorithms were followed during the patient's care in the ED.   Patient presents with acute GI bleed.  Hemoglobin is 13 but with his tachycardia and orthostatic findings, this is likely lagging behind ongoing blood loss.  Will give IV fluids for hemodynamic support.  Will obtain CT abdomen pelvis, plan to admit for further stabilization.  Clinical Course as of May 04 1028  Wed May 05, 2019  0809 Type and screen [facility] [PS]    Clinical Course User Index [PS] Carrie Mew, MD    ----------------------------------------- 10:29 AM on 05/05/2019 -----------------------------------------  CT negative for any acute abnormalities, no clear bleeding source identified.  Remains tachycardic with a heart rate of 122 while at rest.  Still very symptomatic.  Discussed with hospitalist for further management.  Will order Covid screening, IV Protonix bolus.   ____________________________________________   FINAL CLINICAL IMPRESSION(S) / ED DIAGNOSES    Final diagnoses:  Acute GI bleeding     ED Discharge Orders    None      Portions of this note were generated with dragon dictation software. Dictation errors may occur despite best attempts at proofreading.   Carrie Mew, MD 05/05/19 1029

## 2019-05-05 NOTE — ED Notes (Signed)
Date and time results received: 05/05/19 1049 (use smartphrase ".now" to insert current time)  Test: Troponin Critical Value: 128  Name of Provider Notified: Leslye Peer, MD  Orders Received? Or Actions Taken?:

## 2019-05-05 NOTE — Consult Note (Signed)
Cardiology Consultation:   Patient ID: Stephen Avila MRN: [id number]; DOB: 06-Aug-1975  Admit date: 05/05/2019 Date of Consult: 05/05/2019  Primary Care Provider: Patient, No Pcp Per Primary Cardiologist: Iverson Alamin Primary Electrophysiologist:  None    Patient Profile:   Stephen Avila is a 43 y.o. male with no known history of heart disease and a hx of hyperthyroidism, pituitary insufficiency, GERD, constipation, and kidney stones who is being seen today for the evaluation of troponin and shortness of breath with active GIB at the request of Dr. Earleen Newport.  History of Present Illness:   Stephen Avila is a 43 year old male with no known past medical history of heart disease or arrhythmia.  On 05/05/2019, he presented to [facility] emergency department with complaint of dizziness and SOB/DOE that occurred after standing and with ambulation x2 days.   He also noted elevated heart rates as high as 170 bpm while at home.  He denied any chest pain.  He did report generalized abdominal pain with a history of constipation x3 days for which he took laxatives and subsequently experienced dark and runny stools over the last 2 days x2 days. He reported he takes ASA powder multiple times daily (up to 5-6x)  and has recently started noting melena with bowel movements.  He denies any recent nausea or emesis. He reportedly decided to present to [facility] after noting the melena. Since presenting to the ED, he has had multiple bowel movements with documented melena.  In the ED, vitals were significant for BP 117/87, heart rate 134 bpm, 98% on room air.  Labs were significant for WBC 12.8, glucose 118, calcium 8.1, Cr, 0.99, BUN 63, K 4.2, TSH 0.053, albumin 2.7, alkaline phosphatase 37, AST 19, ALT 18, indirect bilirubin 0.5.  Hemoglobin dropped from 13.5  11.6. High-sensitivity troponin 82  128.  Chest x-ray showed no active cardiopulmonary disease.  Subsequent CTA of the abdomen and pelvis showed no active bleeding into  the bowel, right nephrolithiasis without hydronephrosis, focal persistent area of nondistention and possible circumferential mild wall thickening at the descending/sigmoid junction of the colon with follow-up elective colonoscopy or PE recommended to exclude mucosal lesion.  Mild iliac atherosclerosis was also noted.  In the ED, he was started on Protonix, IVF, Ativan.    Heart Pathway Score:     Past Medical History:  Diagnosis Date   Anxiety    Constipation    GERD (gastroesophageal reflux disease)    Hypertension    Kidney stones    Kidney stones     Past Surgical History:  Procedure Laterality Date   CHOLECYSTECTOMY N/A 06/26/2015   Procedure: LAPAROSCOPIC CHOLECYSTECTOMY;  Surgeon: [name], MD;  Location: [facility];  Service: General;  Laterality: N/A;   HERNIA REPAIR     x 2    LITHOTRIPSY       Home Medications:  Prior to Admission medications   Medication Sig Start Date End Date Taking? Authorizing Provider  amLODipine (NORVASC) 10 MG tablet TAKE 1 TABLET BY MOUTH EVERYDAY AT BEDTIME 10/14/18  Yes [provider]  buprenorphine (SUBUTEX) 8 MG SUBL SL tablet Place 20 mg under the tongue daily.  09/22/18  Yes [provider]  testosterone cypionate (DEPOTESTOSTERONE CYPIONATE) 200 MG/ML injection INJECT 1 ML EVERY 2 WEEKS 10/15/18  Yes [provider]    Inpatient Medications: Scheduled Meds:  buprenorphine-naloxone  1 tablet Sublingual Daily   [START ON 05/08/2019] pantoprazole  40 mg Intravenous Q12H   Continuous Infusions:  sodium chloride  pantoprozole (PROTONIX) infusion     sodium chloride     PRN Meds: acetaminophen **OR** acetaminophen, ondansetron **OR** ondansetron (ZOFRAN) IV  Allergies:   No Known Allergies  Social History:   Social History   Socioeconomic History   Marital status: Divorced    Spouse name: Not on file   Number of children: Not on file   Years of education: Not on file   Highest  education level: Not on file  Occupational History   Not on file  Social Needs   Financial resource strain: Not on file   Food insecurity    Worry: Not on file    Inability: Not on file   Transportation needs    Medical: Not on file    Non-medical: Not on file  Tobacco Use   Smoking status: Never Smoker   Smokeless tobacco: Current User    Types: Snuff  Substance and Sexual Activity   Alcohol use: No   Drug use: No   Sexual activity: Not on file  Lifestyle   Physical activity    Days per week: Not on file    Minutes per session: Not on file   Stress: Not on file  Relationships   Social connections    Talks on phone: Not on file    Gets together: Not on file    Attends religious service: Not on file    Active member of club or organization: Not on file    Attends meetings of clubs or organizations: Not on file    Relationship status: Not on file   Intimate partner violence    Fear of current or ex partner: Not on file    Emotionally abused: Not on file    Physically abused: Not on file    Forced sexual activity: Not on file  Other Topics Concern   Not on file  Social History Narrative   Not on file    Family History:    Family History  Problem Relation Age of Onset   COPD Mother    Heart failure Father    Thyroid disease Neg Hx      ROS:  Please see the history of present illness.  Review of Systems  Respiratory: Positive for shortness of breath.   Cardiovascular: Negative for chest pain and palpitations.       Feelings of racing HR  Gastrointestinal: Positive for abdominal pain, blood in stool, constipation, diarrhea and melena.  Genitourinary:       Abdominal pain  Musculoskeletal: Positive for myalgias.  Psychiatric/Behavioral:       Anxiety  All other systems reviewed and are negative.   All other ROS reviewed and negative.     Physical Exam/Data:   Vitals:   05/05/19 0711 05/05/19 0800  BP: 117/87 105/78  Pulse: (!) 134     Resp: 17 18  Temp: 98.8 F (37.1 C)   TempSrc: Oral   SpO2: 98%     Intake/Output Summary (Last 24 hours) at 05/05/2019 1216 Last data filed at 05/05/2019 1146 Gross per 24 hour  Intake 2002.71 ml  Output --  Net 2002.71 ml   Last 3 Weights 11/07/2015 08/30/2015 06/26/2015  Weight (lbs) 203 lb 209 lb 213 lb  Weight (kg) 92.08 kg 94.802 kg 96.616 kg     There is no height or weight on file to calculate BMI.  Physical exam deferred to MD to reduce COVID-19 exposure pending rule out and as patient remains in the ED. EKG:  The EKG was personally reviewed and demonstrates: EKG showed sinus tachycardia, 133 bpm, IVCD, repolarization changes, severe LVH, borderline right axis deviation, abnormal R progression, nonspecific ST and T changes in the inferior and precordial leads consistent with that of previous EKGs and his known severe LVH.    Telemetry:  Telemetry was personally reviewed and demonstrates:  ST with rates into the 150s  Relevant CV Studies: Pending echo  Laboratory Data:  High Sensitivity Troponin:   Recent Labs  Lab 05/05/19 0745 05/05/19 0947  TROPONINIHS 82* 128*     Cardiac EnzymesNo results for input(s): TROPONINI in the last 168 hours. No results for input(s): TROPIPOC in the last 168 hours.  Chemistry Recent Labs  Lab 05/05/19 0745  NA 140  K 4.2  CL 110  CO2 19*  GLUCOSE 118*  BUN 63*  CREATININE 0.99  CALCIUM 8.1*  GFRNONAA >60  GFRAA >60  ANIONGAP 11    Recent Labs  Lab 05/05/19 0947  PROT 4.9*  ALBUMIN 2.7*  AST 19  ALT 18  ALKPHOS 37*  BILITOT 0.5   Hematology Recent Labs  Lab 05/05/19 0713 05/05/19 1137  WBC 12.8*  --   RBC 4.70  --   HGB 13.5 11.6*  HCT 40.6  --   MCV 86.4  --   MCH 28.7  --   MCHC 33.3  --   RDW 13.8  --   PLT 317  --    BNPNo results for input(s): BNP, PROBNP in the last 168 hours.  DDimer No results for input(s): DDIMER in the last 168 hours.   Radiology/Studies:  Dg Chest 2 View  Result Date:  05/05/2019 CLINICAL DATA:  Tachycardia. EXAM: CHEST - 2 VIEW COMPARISON:  None available currently. FINDINGS: The heart size and mediastinal contours are within normal limits. Both lungs are clear. No pneumothorax or pleural effusion is noted. The visualized skeletal structures are unremarkable. IMPRESSION: No active cardiopulmonary disease. Electronically Signed   By: Marijo Conception M.D.   On: 05/05/2019 07:47   Ct Angio Abd/pel W And/or Wo Contrast  Result Date: 05/05/2019 CLINICAL DATA:  Acute GI bleed, diarrhea. History of nephrolithiasis, cholecystectomy. EXAM: CTA ABDOMEN AND PELVIS WITHOUT AND WITH CONTRAST TECHNIQUE: Multidetector CT imaging of the abdomen and pelvis was performed using the standard protocol during bolus administration of intravenous contrast. Multiplanar reconstructed images and MIPs were obtained and reviewed to evaluate the vascular anatomy. CONTRAST:  169mL OMNIPAQUE IOHEXOL 350 MG/ML SOLN COMPARISON:  06/11/2015 FINDINGS: VASCULAR Aorta: Normal caliber aorta without aneurysm, dissection, vasculitis or significant stenosis. Celiac: Patent without evidence of aneurysm, dissection, vasculitis or significant stenosis. SMA: Patent without evidence of aneurysm, dissection, vasculitis or significant stenosis. Renals: Both renal arteries are patent without evidence of aneurysm, dissection, vasculitis, fibromuscular dysplasia or significant stenosis. IMA: Patent without evidence of aneurysm, dissection, vasculitis or significant stenosis. Inflow: Minimal calcified plaque in the common iliac arteries. No aneurysm, dissection, or stenosis. Proximal Outflow: Bilateral common femoral and visualized portions of the superficial and profunda femoral arteries are patent without evidence of aneurysm, dissection, vasculitis or significant stenosis. Veins: Patent hepatic veins, portal vein, SMV, splenic vein, bilateral renal veins. IVC and iliac venous system unremarkable. No venous pathology  evident. Review of the MIP images confirms the above findings. NON-VASCULAR Lower chest: No acute abnormality. Hepatobiliary: No focal liver abnormality is seen. Status post cholecystectomy. No biliary dilatation. Pancreas: Unremarkable. No pancreatic ductal dilatation or surrounding inflammatory changes. Spleen: Normal in size without focal abnormality. Adrenals/Urinary Tract:  Normal adrenals. Subcentimeter probable cyst, mid right kidney. 4 mm calculus, lower pole right renal collecting system. No hydronephrosis or ureterectasis. Urinary bladder incompletely distended. Stomach/Bowel: Stomach is distended by ingested material. The small bowel is nondilated. Normal appendix. The colon is nondilated. Short-segment of incomplete distension and possible wall thickening near the descending/sigmoid junction on all 3 phases of the scan. No evidence of active hemorrhage into the bowel. Lymphatic: No abdominal or pelvic adenopathy. Reproductive: Mild prostate enlargement with central coarse calcification. Other: Bilateral pelvic phleboliths. No ascites. No free air. Musculoskeletal: Advanced degenerative disc disease L4-5, and mild spondylitic changes L5-S1. No fracture or other acute bone abnormality. IMPRESSION: 1. No active bleeding  into bowel. 2. Right nephrolithiasis without hydronephrosis. 3. Focal persistent area of nondistention and possible circumferential mild wall thickening at the descending/sigmoid junction of the colon. Follow-up elective colonoscopy or BE may be useful to exclude mucosal lesion. 4. 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 Electronically Signed   By: Lucrezia Europe M.D.   On: 05/05/2019 09:55    Assessment and Plan:   Elevated HS Tn without CP --No CP.  Reports shortness of breath in the setting of active GI bleed.  EKG shows ST and T wave changes consistent with that of previous EKGs and severe LVH.  High-sensitivity troponin minimally elevated.  Continue to cycle until peaked and  downtrending.  Suspicion at this time is for supply demand ischemia in the setting of known GI bleed.   --Echo pending. If echo shows significantly reduced EF or wall motion abnormality, patient will need further ischemic work-up once he has recovered from his GI bleed and hemoglobin is stable.  At this time, would not recommend a stress test or cardiac catheterization given the patient's drop in hemoglobin. --No plan for emergent ischemic work-up at this time with cardiac catheterization given the patient is currently having a GI bleed with hemoglobin dropping from 13.5 to 11.6 in the same day, though as above, imaging did not indicate a bleed. We will continue to monitor for now with preference to treat underlying illness / GIB at this time. Agree with PPI. Recommend discontinue home ASA powder.  Currently hypotensive and receiving IVF. Recommend low dose BB once room in BP but will defer from now with close monitoring of BP recommended to prevent prerenal AKI. Further recommendations following echo. Caution with fluids until determine EF.  GIB --Per IM. Daily CBC.  Sinus tachycardia --TSH as above. Will monitor and correct electrolytes. Likely 2/2 GIB and dehydration.    For questions or updates, please contact [place] Please consult [email or web address] for contact info under     Signed, Arvil Chaco, PA-C  05/05/2019 12:16 PM

## 2019-05-05 NOTE — ED Notes (Signed)
Dietary called for a tray

## 2019-05-05 NOTE — ED Notes (Signed)
Patient given full liquid dinner tray. Girlfriend at bedside.

## 2019-05-05 NOTE — ED Notes (Signed)
Patient up to bedside commode to have bowel movement. Patient noted to have dark bloody stools. Repositioned back in bed. Patient transported to xray. MD made aware.

## 2019-05-05 NOTE — ED Notes (Signed)
Pt transferred to room 223 by the tech.

## 2019-05-05 NOTE — ED Notes (Signed)
Patient unhooked from monitor and ambulatory to restroom. Steady gait and NAD noted.

## 2019-05-05 NOTE — ED Notes (Signed)
Pt alert and watching tv.  Pt waiting on admission.

## 2019-05-05 NOTE — ED Notes (Signed)
Orthostatic VS:  Lying: BP 93/75, HR 112 Sitting: BP 103/69, HR 135 Standing: BP 110/72, HR 145

## 2019-05-05 NOTE — H&P (Signed)
[facility] NAME: Stephen Avila    MR#:  [id number]  DATE OF BIRTH:  [birth year]  DATE OF ADMISSION:  05/05/2019  PRIMARY CARE PHYSICIAN: Patient, No Pcp Per   REQUESTING/REFERRING PHYSICIAN: Dr Carrie Mew  CHIEF COMPLAINT:   Chief Complaint  Patient presents with  . Tachycardia  . Nausea    HISTORY OF PRESENT ILLNESS:  Stephen Avila  is a 43 y.o. male coming in with waking up at 3:30 in the morning swelling.  He felt weak in his knees.  Then he started noticing dark blood which was black with bowel movement.  No abdominal pain.  Some nausea but no vomiting.  No chest pain.  In the ER he had 3 more episodes of black bowel movements.  He has not gotten any sleep last night.  He does take Goody powders or BC powder 5-6 times a day.  The last time he ate was yesterday 2-3 in the afternoon yesterday.  In the ER, he is tachycardic and troponin was found to be elevated.  Hospitalist services contacted for further evaluation.  PAST MEDICAL HISTORY:   Past Medical History:  Diagnosis Date  . Anxiety   . Constipation   . GERD (gastroesophageal reflux disease)   . Hypertension   . Kidney stones   . Kidney stones     PAST SURGICAL HISTORY:   Past Surgical History:  Procedure Laterality Date  . CHOLECYSTECTOMY N/A 06/26/2015   Procedure: LAPAROSCOPIC CHOLECYSTECTOMY;  Surgeon: [name], MD;  Location: [facility];  Service: General;  Laterality: N/A;  . HERNIA REPAIR     x 2   . LITHOTRIPSY      SOCIAL HISTORY:   Social History   Tobacco Use  . Smoking status: Never Smoker  . Smokeless tobacco: Current User    Types: Snuff  Substance Use Topics  . Alcohol use: No    FAMILY HISTORY:   Family History  Problem Relation Age of Onset  . COPD Mother   . Heart failure Father   . Thyroid disease Neg Hx     DRUG ALLERGIES:  No Known Allergies  REVIEW OF SYSTEMS:  CONSTITUTIONAL: No fever or chills.   Positive for sweating.  Some fatigue.  EYES: No blurred or double vision.  EARS, NOSE, AND THROAT: No tinnitus or ear pain. No sore throat RESPIRATORY: No cough.some shortness of breath.  No wheezing or hemoptysis.  CARDIOVASCULAR: No chest pain, orthopnea, edema.  GASTROINTESTINAL: No nausea, vomiting, or abdominal pain.  4 episodes of black stools. GENITOURINARY: No dysuria, hematuria.  ENDOCRINE: No polyuria, nocturia,  HEMATOLOGY: No anemia, easy bruising or bleeding SKIN: No rash or lesion. MUSCULOSKELETAL: Positive for low back pain NEUROLOGIC: No tingling, numbness, weakness.  PSYCHIATRY: No anxiety or depression.   MEDICATIONS AT HOME:   Prior to Admission medications   Medication Sig Start Date End Date Taking? Authorizing Provider  amLODipine (NORVASC) 10 MG tablet TAKE 1 TABLET BY MOUTH EVERYDAY AT BEDTIME 10/14/18  Yes [provider]  buprenorphine (SUBUTEX) 8 MG SUBL SL tablet Place 20 mg under the tongue daily.  09/22/18  Yes [provider]  testosterone cypionate (DEPOTESTOSTERONE CYPIONATE) 200 MG/ML injection INJECT 1 ML EVERY 2 WEEKS 10/15/18  Yes [provider]      VITAL SIGNS:  Blood pressure 105/78, pulse (!) 134, temperature 98.8 F (37.1 C), temperature source Oral, resp. rate 18, SpO2 98 %.  PHYSICAL EXAMINATION:  GENERAL:  43  y.o.-year-old patient lying in the bed with no acute distress.  EYES: Pupils equal, round, reactive to light and accommodation. No scleral icterus. Extraocular muscles intact.  HEENT: Head atraumatic, normocephalic. Oropharynx and nasopharynx clear.  NECK:  Supple, no jugular venous distention. No thyroid enlargement, no tenderness.  LUNGS: Normal breath sounds bilaterally, no wheezing, rales,rhonchi or crepitation. No use of accessory muscles of respiration.  CARDIOVASCULAR: S1, S2 tachycardic. No murmurs, rubs, or gallops.  ABDOMEN: Soft, nontender, nondistended. Bowel sounds present. No organomegaly or  mass.  EXTREMITIES: No pedal edema, cyanosis, or clubbing.  NEUROLOGIC: Cranial nerves II through XII are intact. Muscle strength 5/5 in all extremities. Sensation intact. Gait not checked.  PSYCHIATRIC: The patient is alert and oriented x 3.  SKIN: No rash, lesion, or ulcer.   LABORATORY PANEL:   CBC Recent Labs  Lab 05/05/19 0713  WBC 12.8*  HGB 13.5  HCT 40.6  PLT 317   ------------------------------------------------------------------------------------------------------------------  Chemistries  Recent Labs  Lab 05/05/19 0745 05/05/19 0947  NA 140  --   K 4.2  --   CL 110  --   CO2 19*  --   GLUCOSE 118*  --   BUN 63*  --   CREATININE 0.99  --   CALCIUM 8.1*  --   AST  --  19  ALT  --  18  ALKPHOS  --  37*  BILITOT  --  0.5   ------------------------------------------------------------------------------------------------------------------  Cardiac Enzymes First troponin 82, second troponin 128  RADIOLOGY:  Dg Chest 2 View  Result Date: 05/05/2019 CLINICAL DATA:  Tachycardia. EXAM: CHEST - 2 VIEW COMPARISON:  None available currently. FINDINGS: The heart size and mediastinal contours are within normal limits. Both lungs are clear. No pneumothorax or pleural effusion is noted. The visualized skeletal structures are unremarkable. IMPRESSION: No active cardiopulmonary disease. Electronically Signed   By: Marijo Conception M.D.   On: 05/05/2019 07:47   Ct Angio Abd/pel W And/or Wo Contrast  Result Date: 05/05/2019 CLINICAL DATA:  Acute GI bleed, diarrhea. History of nephrolithiasis, cholecystectomy. EXAM: CTA ABDOMEN AND PELVIS WITHOUT AND WITH CONTRAST TECHNIQUE: Multidetector CT imaging of the abdomen and pelvis was performed using the standard protocol during bolus administration of intravenous contrast. Multiplanar reconstructed images and MIPs were obtained and reviewed to evaluate the vascular anatomy. CONTRAST:  160mL OMNIPAQUE IOHEXOL 350 MG/ML SOLN COMPARISON:   06/11/2015 FINDINGS: VASCULAR Aorta: Normal caliber aorta without aneurysm, dissection, vasculitis or significant stenosis. Celiac: Patent without evidence of aneurysm, dissection, vasculitis or significant stenosis. SMA: Patent without evidence of aneurysm, dissection, vasculitis or significant stenosis. Renals: Both renal arteries are patent without evidence of aneurysm, dissection, vasculitis, fibromuscular dysplasia or significant stenosis. IMA: Patent without evidence of aneurysm, dissection, vasculitis or significant stenosis. Inflow: Minimal calcified plaque in the common iliac arteries. No aneurysm, dissection, or stenosis. Proximal Outflow: Bilateral common femoral and visualized portions of the superficial and profunda femoral arteries are patent without evidence of aneurysm, dissection, vasculitis or significant stenosis. Veins: Patent hepatic veins, portal vein, SMV, splenic vein, bilateral renal veins. IVC and iliac venous system unremarkable. No venous pathology evident. Review of the MIP images confirms the above findings. NON-VASCULAR Lower chest: No acute abnormality. Hepatobiliary: No focal liver abnormality is seen. Status post cholecystectomy. No biliary dilatation. Pancreas: Unremarkable. No pancreatic ductal dilatation or surrounding inflammatory changes. Spleen: Normal in size without focal abnormality. Adrenals/Urinary Tract: Normal adrenals. Subcentimeter probable cyst, mid right kidney. 4 mm calculus, lower pole right renal collecting system. No  hydronephrosis or ureterectasis. Urinary bladder incompletely distended. Stomach/Bowel: Stomach is distended by ingested material. The small bowel is nondilated. Normal appendix. The colon is nondilated. Short-segment of incomplete distension and possible wall thickening near the descending/sigmoid junction on all 3 phases of the scan. No evidence of active hemorrhage into the bowel. Lymphatic: No abdominal or pelvic adenopathy. Reproductive: Mild  prostate enlargement with central coarse calcification. Other: Bilateral pelvic phleboliths. No ascites. No free air. Musculoskeletal: Advanced degenerative disc disease L4-5, and mild spondylitic changes L5-S1. No fracture or other acute bone abnormality. IMPRESSION: 1. No active bleeding  into bowel. 2. Right nephrolithiasis without hydronephrosis. 3. Focal persistent area of nondistention and possible circumferential mild wall thickening at the descending/sigmoid junction of the colon. Follow-up elective colonoscopy or BE may be useful to exclude mucosal lesion. 4. 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 Electronically Signed   By: Lucrezia Europe M.D.   On: 05/05/2019 09:55    EKG:   EKG personally reviewed and read by me showing sinus tachycardia 133 bpm, LVH, nonspecific ST-T wave changes.  IMPRESSION AND PLAN:   1.  Upper GI bleed with tachycardia.  Check orthostatic vital signs.  Patient already received 2 fluid boluses.  We will give another fluid bolus and IV fluid hydration.  Start Protonix drip.  GI consultation, case discussed with gastroenterology.  N.p.o.  Currently we do not have any stepdown or ICU beds will try to follow on the floor.  Serial hemoglobins.  Patient advised to stop BC powder and Goody powder. 2.  Elevated troponin.  Likely from GI bleed and demand ischemia.  I did ask cardiology to see.  With GI bleed and relative hypotension, limited with treatment options for this. 3.  Chronic pain. continue meds. 4.  Low TSH.  Recommend outpatient follow-up. 5.  Hypertension history.  Blood pressure on the lower side.  Hold Norvasc   All the records are reviewed and case discussed with ED provider. Management plans discussed with the patient, and he is  in agreement. Case discussed with gastroenterology, cardiology and ICU team.  Currently there are no stepdown or ICU beds so we will have to manage on the floor.  Laboratory data and case history discussed with all the  specialist.  CODE STATUS: Full code  TOTAL TIME TAKING CARE OF THIS PATIENT: 55 minutes.    Loletha Grayer M.D on 05/05/2019 at 11:01 AM  Between 7am to 6pm - Pager - [phone]  After 6pm call admission pager [phone]  [facility]  [phone]  CC: Primary care physician; Patient, No Pcp Per

## 2019-05-06 ENCOUNTER — Inpatient Hospital Stay (HOSPITAL_COMMUNITY)
Admit: 2019-05-06 | Discharge: 2019-05-06 | Disposition: A | Discharge status: Home / Self Care | Payer: BC Managed Care – PPO | Attending: Internal Medicine | Admitting: Internal Medicine

## 2019-05-06 DIAGNOSIS — F419 Anxiety disorder, unspecified: Secondary | ICD-10-CM | POA: Diagnosis not present

## 2019-05-06 DIAGNOSIS — K922 Gastrointestinal hemorrhage, unspecified: Secondary | ICD-10-CM

## 2019-05-06 DIAGNOSIS — R000 Tachycardia, unspecified: Secondary | ICD-10-CM

## 2019-05-06 DIAGNOSIS — D649 Anemia, unspecified: Secondary | ICD-10-CM

## 2019-05-06 DIAGNOSIS — I959 Hypotension, unspecified: Secondary | ICD-10-CM | POA: Diagnosis not present

## 2019-05-06 DIAGNOSIS — R748 Abnormal levels of other serum enzymes: Secondary | ICD-10-CM

## 2019-05-06 LAB — ECHOCARDIOGRAM COMPLETE
Height: 66 in
Weight: 3121.6 oz

## 2019-05-06 LAB — HEMOGLOBIN: Hemoglobin: 8.6 g/dL — ABNORMAL LOW (ref 13.0–17.0)

## 2019-05-06 LAB — BASIC METABOLIC PANEL
Anion gap: 5 (ref 5–15)
BUN: 29 mg/dL — ABNORMAL HIGH (ref 6–20)
CO2: 24 mmol/L (ref 22–32)
Calcium: 7.6 mg/dL — ABNORMAL LOW (ref 8.9–10.3)
Chloride: 110 mmol/L (ref 98–111)
Creatinine, Ser: 0.82 mg/dL (ref 0.61–1.24)
GFR calc Af Amer: >60 mL/min (ref >60)
GFR calc non Af Amer: >60 mL/min (ref >60)
Glucose, Bld: 84 mg/dL (ref 70–99)
Potassium: 3.4 mmol/L — ABNORMAL LOW (ref 3.5–5.1)
Sodium: 139 mmol/L (ref 135–145)

## 2019-05-06 LAB — CBC
HCT: 27 % — ABNORMAL LOW (ref 39.0–52.0)
Hemoglobin: 9.1 g/dL — ABNORMAL LOW (ref 13.0–17.0)
MCH: 28.7 pg (ref 26.0–34.0)
MCHC: 33.7 g/dL (ref 30.0–36.0)
MCV: 85.2 fL (ref 80.0–100.0)
Platelets: 224 10*3/uL (ref 150–400)
RBC: 3.17 MIL/uL — ABNORMAL LOW (ref 4.22–5.81)
RDW: 14.3 % (ref 11.5–15.5)
WBC: 7.3 10*3/uL (ref 4.0–10.5)
nRBC: 0 % (ref 0.0–0.2)

## 2019-05-06 LAB — TROPONIN I (HIGH SENSITIVITY)
Troponin I (High Sensitivity): 26 ng/L — ABNORMAL HIGH (ref <18)
Troponin I (High Sensitivity): 27 ng/L — ABNORMAL HIGH (ref <18)

## 2019-05-06 LAB — TSH: TSH: 0.033 u[IU]/mL — ABNORMAL LOW (ref 0.350–4.500)

## 2019-05-06 LAB — VITAMIN B12: Vitamin B-12: 258 pg/mL (ref 180–914)

## 2019-05-06 LAB — HEMOGLOBIN AND HEMATOCRIT, BLOOD
HCT: 27.2 % — ABNORMAL LOW (ref 39.0–52.0)
HCT: 26.9 % — ABNORMAL LOW (ref 39.0–52.0)
Hemoglobin: 9.3 g/dL — ABNORMAL LOW (ref 13.0–17.0)
Hemoglobin: 9 g/dL — ABNORMAL LOW (ref 13.0–17.0)

## 2019-05-06 LAB — MRSA PCR SCREENING: MRSA by PCR: NEGATIVE

## 2019-05-06 LAB — HIV ANTIBODY (ROUTINE TESTING W REFLEX): HIV Screen 4th Generation wRfx: NONREACTIVE

## 2019-05-06 MED ORDER — INFLUENZA VAC SPLIT QUAD 0.5 ML IM SUSY: 0.5000 mL | PREFILLED_SYRINGE | INTRAMUSCULAR | Status: DC

## 2019-05-06 MED ORDER — SODIUM CHLORIDE 0.9 % IV SOLN
510.0000 mg | Freq: Once | INTRAVENOUS | Status: AC
  Administered 2019-05-06: 510 mg via INTRAVENOUS
  Filled 2019-05-06: qty 17

## 2019-05-06 MED ORDER — SODIUM CHLORIDE 0.9% FLUSH
3.0000 mL | Freq: Two times a day (BID) | INTRAVENOUS | Status: DC
  Administered 2019-05-06 – 2019-05-07 (×2): 3 mL via INTRAVENOUS

## 2019-05-06 MED ORDER — POTASSIUM CHLORIDE CRYS ER 20 MEQ PO TBCR
40.0000 meq | EXTENDED_RELEASE_TABLET | Freq: Once | ORAL | Status: AC
  Administered 2019-05-06: 40 meq via ORAL
  Filled 2019-05-06: qty 2

## 2019-05-06 MED ORDER — POLYETHYLENE GLYCOL 3350 17 GM/SCOOP PO POWD
1.0000 | Freq: Once | ORAL | Status: AC
  Administered 2019-05-06: 255 g via ORAL
  Filled 2019-05-06: qty 255

## 2019-05-06 MED ORDER — SODIUM CHLORIDE 0.9 % IV SOLN
300.0000 mg | Freq: Once | INTRAVENOUS | Status: AC
  Administered 2019-05-07: 16:00:00 300 mg via INTRAVENOUS
  Filled 2019-05-06: qty 15

## 2019-05-06 MED ORDER — MAGNESIUM CITRATE PO SOLN
1.0000 | Freq: Once | ORAL | Status: AC
  Administered 2019-05-06: 16:00:00 1 via ORAL
  Filled 2019-05-06: qty 296

## 2019-05-06 MED ORDER — VITAMIN B-12 1000 MCG PO TABS
1000.0000 ug | ORAL_TABLET | Freq: Every day | ORAL | Status: DC
  Administered 2019-05-06 – 2019-05-07 (×2): 1000 ug via ORAL
  Filled 2019-05-06 (×2): qty 1

## 2019-05-06 MED ORDER — ALPRAZOLAM 0.5 MG PO TABS
0.5000 mg | ORAL_TABLET | Freq: Two times a day (BID) | ORAL | Status: DC | PRN
  Administered 2019-05-06: 18:00:00 0.5 mg via ORAL
  Filled 2019-05-06: qty 1

## 2019-05-06 MED ORDER — POTASSIUM CHLORIDE CRYS ER 20 MEQ PO TBCR
20.0000 meq | EXTENDED_RELEASE_TABLET | Freq: Once | ORAL | Status: AC
  Administered 2019-05-06: 14:00:00 20 meq via ORAL
  Filled 2019-05-06: qty 1

## 2019-05-06 NOTE — Progress Notes (Signed)
*  PRELIMINARY RESULTS* Echocardiogram 2D Echocardiogram has been performed.  Stephen Avila 05/06/2019, 9:10 AM

## 2019-05-06 NOTE — Progress Notes (Signed)
Stephen Darby, MD [address]  [place]  [place], [place] [address]  Main: [phone]  Fax: [phone] Pager: [phone]   Subjective: Patient is moved to floor.  He denies abdominal pain, nausea or vomiting.  He denies further episodes of melena.  He had no issues overnight.  He is tolerating full liquids well   Objective: Vital signs in last 24 hours: Vitals:   05/05/19 2045 05/05/19 2237 05/06/19 0337 05/06/19 0748  BP: 119/78 111/68 102/66 102/67  Pulse: 100 (!) 115 94 89  Resp: 20  20 18   Temp: 98.1 F (36.7 C)  98 F (36.7 C) 98.1 F (36.7 C)  TempSrc: Oral  Oral Oral  SpO2: 99% 99% 97% 95%  Weight: 88.3 kg  88.5 kg   Height: 5\' 6"  (1.676 m)      Weight change:   Intake/Output Summary (Last 24 hours) at 05/06/2019 1704 Last data filed at 05/06/2019 1617 Gross per 24 hour  Intake 2735.71 ml  Output 1250 ml  Net 1485.71 ml     Exam: Heart:: Regular rate and rhythm, S1S2 present or without murmur or extra heart sounds Lungs: normal and clear to auscultation Abdomen: soft, nontender, normal bowel sounds   Lab Results: CBC Latest Ref Rng & Units 05/06/2019 05/06/2019 05/06/2019  WBC 4.0 - 10.5 K/uL - - 7.3  Hemoglobin 13.0 - 17.0 g/dL 9.3(L) 8.6(L) 9.1(L)  Hematocrit 39.0 - 52.0 % 27.2(L) - 27.0(L)  Platelets 150 - 400 K/uL - - 224   CMP Latest Ref Rng & Units 05/06/2019 05/05/2019 06/21/2015  Glucose 70 - 99 mg/dL 84 118(H) 104(H)  BUN 6 - 20 mg/dL 29(H) 63(H) 23(H)  Creatinine 0.61 - 1.24 mg/dL 0.82 0.99 1.19  Sodium 135 - 145 mmol/L 139 140 137  Potassium 3.5 - 5.1 mmol/L 3.4(L) 4.2 3.4(L)  Chloride 98 - 111 mmol/L 110 110 97(L)  CO2 22 - 32 mmol/L 24 19(L) 31  Calcium 8.9 - 10.3 mg/dL 7.6(L) 8.1(L) 9.8  Total Protein 6.5 - 8.1 g/dL - 4.9(L) 7.4  Total Bilirubin 0.3 - 1.2 mg/dL - 0.5 0.5  Alkaline Phos 38 - 126 U/L - 37(L) 71  AST 15 - 41 U/L - 19 30  ALT 0 - 44 U/L - 18 27    Micro Results: Recent Results (from the past 240  hour(s))  SARS Coronavirus 2 by RT PCR (hospital order, performed in [facility] hospital lab) Nasopharyngeal Nasopharyngeal Swab     Status: None   Collection Time: 05/05/19 11:09 AM   Specimen: Nasopharyngeal Swab  Result Value Ref Range Status   SARS Coronavirus 2 NEGATIVE NEGATIVE Final    Comment: (NOTE) If result is NEGATIVE SARS-CoV-2 target nucleic acids are NOT DETECTED. The SARS-CoV-2 RNA is generally detectable in upper and lower  respiratory specimens during the acute phase of infection. The lowest  concentration of SARS-CoV-2 viral copies this assay can detect is 250  copies / mL. A negative result does not preclude SARS-CoV-2 infection  and should not be used as the sole basis for treatment or other  patient management decisions.  A negative result may occur with  improper specimen collection / handling, submission of specimen other  than nasopharyngeal swab, presence of viral mutation(s) within the  areas targeted by this assay, and inadequate number of viral copies  (<250 copies / mL). A negative result must be combined with clinical  observations, patient history, and epidemiological information. If result is POSITIVE SARS-CoV-2 target nucleic acids  are DETECTED. The SARS-CoV-2 RNA is generally detectable in upper and lower  respiratory specimens dur ing the acute phase of infection.  Positive  results are indicative of active infection with SARS-CoV-2.  Clinical  correlation with patient history and other diagnostic information is  necessary to determine patient infection status.  Positive results do  not rule out bacterial infection or co-infection with other viruses. If result is PRESUMPTIVE POSTIVE SARS-CoV-2 nucleic acids MAY BE PRESENT.   A presumptive positive result was obtained on the submitted specimen  and confirmed on repeat testing.  While 2019 novel coronavirus  (SARS-CoV-2) nucleic acids may be present in the submitted sample  additional confirmatory  testing may be necessary for epidemiological  and / or clinical management purposes  to differentiate between  SARS-CoV-2 and other Sarbecovirus currently known to infect humans.  If clinically indicated additional testing with an alternate test  methodology [id number]) is advised. The SARS-CoV-2 RNA is generally  detectable in upper and lower respiratory sp ecimens during the acute  phase of infection. The expected result is Negative. Fact Sheet for Patients:  StrictlyIdeas.no Fact Sheet for Healthcare Providers: BankingDealers.co.za This test is not yet approved or cleared by the [place] FDA and has been authorized for detection and/or diagnosis of SARS-CoV-2 by FDA under an Emergency Use Authorization (EUA).  This EUA will remain in effect (meaning this test can be used) for the duration of the COVID-19 declaration under Section 564(b)(1) of the Act, 21 U.S.C. section 360bbb-3(b)(1), unless the authorization is terminated or revoked sooner. Performed at [facility], [address] [place], [place] [address]   MRSA PCR Screening     Status: None   Collection Time: 05/06/19  6:15 AM   Specimen: Nasal Mucosa; Nasopharyngeal  Result Value Ref Range Status   MRSA by PCR NEGATIVE NEGATIVE Final    Comment:        The GeneXpert MRSA Assay (FDA approved for NASAL specimens only), is one component of a comprehensive MRSA colonization surveillance program. It is not intended to diagnose MRSA infection nor to guide or monitor treatment for MRSA infections. Performed at [facility], [address], [place],  [address]    Studies/Results: Dg Chest 2 View  Result Date: 05/05/2019 CLINICAL DATA:  Tachycardia. EXAM: CHEST - 2 VIEW COMPARISON:  None available currently. FINDINGS: The heart size and mediastinal contours are within normal limits. Both lungs are clear. No pneumothorax or pleural effusion is  noted. The visualized skeletal structures are unremarkable. IMPRESSION: No active cardiopulmonary disease. Electronically Signed   By: Marijo Conception M.D.   On: 05/05/2019 07:47   Ct Angio Abd/pel W And/or Wo Contrast  Result Date: 05/05/2019 CLINICAL DATA:  Acute GI bleed, diarrhea. History of nephrolithiasis, cholecystectomy. EXAM: CTA ABDOMEN AND PELVIS WITHOUT AND WITH CONTRAST TECHNIQUE: Multidetector CT imaging of the abdomen and pelvis was performed using the standard protocol during bolus administration of intravenous contrast. Multiplanar reconstructed images and MIPs were obtained and reviewed to evaluate the vascular anatomy. CONTRAST:  117mL OMNIPAQUE IOHEXOL 350 MG/ML SOLN COMPARISON:  06/11/2015 FINDINGS: VASCULAR Aorta: Normal caliber aorta without aneurysm, dissection, vasculitis or significant stenosis. Celiac: Patent without evidence of aneurysm, dissection, vasculitis or significant stenosis. SMA: Patent without evidence of aneurysm, dissection, vasculitis or significant stenosis. Renals: Both renal arteries are patent without evidence of aneurysm, dissection, vasculitis, fibromuscular dysplasia or significant stenosis. IMA: Patent without evidence of aneurysm, dissection, vasculitis or significant stenosis. Inflow: Minimal calcified plaque in the common  iliac arteries. No aneurysm, dissection, or stenosis. Proximal Outflow: Bilateral common femoral and visualized portions of the superficial and profunda femoral arteries are patent without evidence of aneurysm, dissection, vasculitis or significant stenosis. Veins: Patent hepatic veins, portal vein, SMV, splenic vein, bilateral renal veins. IVC and iliac venous system unremarkable. No venous pathology evident. Review of the MIP images confirms the above findings. NON-VASCULAR Lower chest: No acute abnormality. Hepatobiliary: No focal liver abnormality is seen. Status post cholecystectomy. No biliary dilatation. Pancreas: Unremarkable. No  pancreatic ductal dilatation or surrounding inflammatory changes. Spleen: Normal in size without focal abnormality. Adrenals/Urinary Tract: Normal adrenals. Subcentimeter probable cyst, mid right kidney. 4 mm calculus, lower pole right renal collecting system. No hydronephrosis or ureterectasis. Urinary bladder incompletely distended. Stomach/Bowel: Stomach is distended by ingested material. The small bowel is nondilated. Normal appendix. The colon is nondilated. Short-segment of incomplete distension and possible wall thickening near the descending/sigmoid junction on all 3 phases of the scan. No evidence of active hemorrhage into the bowel. Lymphatic: No abdominal or pelvic adenopathy. Reproductive: Mild prostate enlargement with central coarse calcification. Other: Bilateral pelvic phleboliths. No ascites. No free air. Musculoskeletal: Advanced degenerative disc disease L4-5, and mild spondylitic changes L5-S1. No fracture or other acute bone abnormality. IMPRESSION: 1. No active bleeding  into bowel. 2. Right nephrolithiasis without hydronephrosis. 3. Focal persistent area of nondistention and possible circumferential mild wall thickening at the descending/sigmoid junction of the colon. Follow-up elective colonoscopy or BE may be useful to exclude mucosal lesion. 4. 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 Electronically Signed   By: Lucrezia Europe M.D.   On: 05/05/2019 09:55   Medications:  I have reviewed the patient's current medications. Prior to Admission:  Medications Prior to Admission  Medication Sig Dispense Refill Last Dose   amLODipine (NORVASC) 10 MG tablet TAKE 1 TABLET BY MOUTH EVERYDAY AT BEDTIME   Past Week at Unknown time   buprenorphine (SUBUTEX) 8 MG SUBL SL tablet Place 20 mg under the tongue daily.    05/04/2019 at 1400   testosterone cypionate (DEPOTESTOSTERONE CYPIONATE) 200 MG/ML injection INJECT 1 ML EVERY 2 WEEKS   Past Week at Unknown time   Scheduled:   buprenorphine-naloxone  1 tablet Sublingual Daily   [START ON 05/07/2019] influenza vac split quadrivalent PF  0.5 mL Intramuscular Tomorrow-1000   [START ON 05/08/2019] pantoprazole  40 mg Intravenous Q12H   polyethylene glycol  17 g Oral BID   vitamin B-12  1,000 mcg Oral Daily   Continuous:  [START ON 05/07/2019] iron sucrose     pantoprozole (PROTONIX) infusion 8 mg/hr (05/06/19 1049)   [id number] **OR** acetaminophen, ALPRAZolam, ondansetron **OR** ondansetron (ZOFRAN) IV Anti-infectives (From admission, onward)   None     Scheduled Meds:  buprenorphine-naloxone  1 tablet Sublingual Daily   [START ON 05/07/2019] influenza vac split quadrivalent PF  0.5 mL Intramuscular Tomorrow-1000   [START ON 05/08/2019] pantoprazole  40 mg Intravenous Q12H   polyethylene glycol  17 g Oral BID   vitamin B-12  1,000 mcg Oral Daily   Continuous Infusions:  [START ON 05/07/2019] iron sucrose     pantoprozole (PROTONIX) infusion 8 mg/hr (05/06/19 1049)   PRN Meds:.acetaminophen **OR** acetaminophen, ALPRAZolam, ondansetron **OR** ondansetron (ZOFRAN) IV   Assessment: Active Problems:   Upper GI bleed  Hematochezia/melena Hemoglobin is stable in last 24 hours with no active GI bleed.  BUN has normalized to volume resuscitation  Plan: Continue pantoprazole drip Patient is evaluated by cardiology and no further cardiac work-up is recommended  at this time Plan for EGD and colonoscopy tomorrow Clear liquid diet today Bowel prep ordered N.p.o. past midnight Monitor CBC and transfuse to maintain hemoglobin above 8 Parenteral iron Avoid NSAIDs   LOS: 1 day   Uchenna Rappaport 05/06/2019, 5:04 PM

## 2019-05-06 NOTE — Progress Notes (Signed)
[place] at [place] NAME: Stephen Avila    MR#:  [id number]  DATE OF BIRTH:  1975-11-20  SUBJECTIVE:  CHIEF COMPLAINT: Patient reports some lower abdominal pain.  Denies any other episodes of bowel movements and no hematemesis.  Girlfriend at bedside.  Concerned that night nurse did not place his SCDs last night  REVIEW OF SYSTEMS:  CONSTITUTIONAL: No fever, fatigue or weakness.  EYES: No blurred or double vision.  EARS, NOSE, AND THROAT: No tinnitus or ear pain.  RESPIRATORY: No cough, shortness of breath, wheezing or hemoptysis.  CARDIOVASCULAR: No chest pain, orthopnea, edema.  GASTROINTESTINAL: No nausea, vomiting, diarrhea or abdominal pain.  GENITOURINARY: No dysuria, hematuria.  ENDOCRINE: No polyuria, nocturia,  HEMATOLOGY: No anemia, easy bruising or bleeding SKIN: No rash or lesion. MUSCULOSKELETAL: No joint pain or arthritis.   NEUROLOGIC: No tingling, numbness, weakness.  PSYCHIATRY: No anxiety or depression.   DRUG ALLERGIES:  No Known Allergies  VITALS:  Blood pressure 102/67, pulse 89, temperature 98.1 F (36.7 C), temperature source Oral, resp. rate 18, height 5\' 6"  (1.676 m), weight 88.5 kg, SpO2 95 %.  PHYSICAL EXAMINATION:  GENERAL:  43 y.o.-year-old patient lying in the bed with no acute distress.  EYES: Pupils equal, round, reactive to light and accommodation. No scleral icterus. Extraocular muscles intact.  HEENT: Head atraumatic, normocephalic. Oropharynx and nasopharynx clear.  NECK:  Supple, no jugular venous distention. No thyroid enlargement, no tenderness.  LUNGS: Normal breath sounds bilaterally, no wheezing, rales,rhonchi or crepitation. No use of accessory muscles of respiration.  CARDIOVASCULAR: S1, S2 normal. No murmurs, rubs, or gallops.  ABDOMEN: Soft, nontender, nondistended. Bowel sounds present.  EXTREMITIES: No pedal edema, cyanosis, or clubbing.  NEUROLOGIC: Cranial nerves II through  XII are intact. Muscle strength 5/5 in all extremities. Sensation intact. Gait not checked.  PSYCHIATRIC: The patient is alert and oriented x 3.  SKIN: No obvious rash, lesion, or ulcer.    LABORATORY PANEL:   CBC Recent Labs  Lab 05/06/19 0423 05/06/19 1422  WBC 7.3  --   HGB 9.1* 8.6*  HCT 27.0*  --   PLT 224  --    ------------------------------------------------------------------------------------------------------------------  Chemistries  Recent Labs  Lab 05/05/19 0947 05/06/19 0423  NA  --  139  K  --  3.4*  CL  --  110  CO2  --  24  GLUCOSE  --  84  BUN  --  29*  CREATININE  --  0.82  CALCIUM  --  7.6*  AST 19  --   ALT 18  --   ALKPHOS 37*  --   BILITOT 0.5  --    ------------------------------------------------------------------------------------------------------------------  Cardiac Enzymes No results for input(s): TROPONINI in the last 168 hours. ------------------------------------------------------------------------------------------------------------------  RADIOLOGY:  Dg Chest 2 View  Result Date: 05/05/2019 CLINICAL DATA:  Tachycardia. EXAM: CHEST - 2 VIEW COMPARISON:  None available currently. FINDINGS: The heart size and mediastinal contours are within normal limits. Both lungs are clear. No pneumothorax or pleural effusion is noted. The visualized skeletal structures are unremarkable. IMPRESSION: No active cardiopulmonary disease. Electronically Signed   By: Marijo Conception M.D.   On: 05/05/2019 07:47   Ct Angio Abd/pel W And/or Wo Contrast  Result Date: 05/05/2019 CLINICAL DATA:  Acute GI bleed, diarrhea. History of nephrolithiasis, cholecystectomy. EXAM: CTA ABDOMEN AND PELVIS WITHOUT AND WITH CONTRAST TECHNIQUE: Multidetector CT imaging of the abdomen and pelvis was performed using the standard protocol during  bolus administration of intravenous contrast. Multiplanar reconstructed images and MIPs were obtained and reviewed to evaluate the  vascular anatomy. CONTRAST:  123mL OMNIPAQUE IOHEXOL 350 MG/ML SOLN COMPARISON:  06/11/2015 FINDINGS: VASCULAR Aorta: Normal caliber aorta without aneurysm, dissection, vasculitis or significant stenosis. Celiac: Patent without evidence of aneurysm, dissection, vasculitis or significant stenosis. SMA: Patent without evidence of aneurysm, dissection, vasculitis or significant stenosis. Renals: Both renal arteries are patent without evidence of aneurysm, dissection, vasculitis, fibromuscular dysplasia or significant stenosis. IMA: Patent without evidence of aneurysm, dissection, vasculitis or significant stenosis. Inflow: Minimal calcified plaque in the common iliac arteries. No aneurysm, dissection, or stenosis. Proximal Outflow: Bilateral common femoral and visualized portions of the superficial and profunda femoral arteries are patent without evidence of aneurysm, dissection, vasculitis or significant stenosis. Veins: Patent hepatic veins, portal vein, SMV, splenic vein, bilateral renal veins. IVC and iliac venous system unremarkable. No venous pathology evident. Review of the MIP images confirms the above findings. NON-VASCULAR Lower chest: No acute abnormality. Hepatobiliary: No focal liver abnormality is seen. Status post cholecystectomy. No biliary dilatation. Pancreas: Unremarkable. No pancreatic ductal dilatation or surrounding inflammatory changes. Spleen: Normal in size without focal abnormality. Adrenals/Urinary Tract: Normal adrenals. Subcentimeter probable cyst, mid right kidney. 4 mm calculus, lower pole right renal collecting system. No hydronephrosis or ureterectasis. Urinary bladder incompletely distended. Stomach/Bowel: Stomach is distended by ingested material. The small bowel is nondilated. Normal appendix. The colon is nondilated. Short-segment of incomplete distension and possible wall thickening near the descending/sigmoid junction on all 3 phases of the scan. No evidence of active hemorrhage  into the bowel. Lymphatic: No abdominal or pelvic adenopathy. Reproductive: Mild prostate enlargement with central coarse calcification. Other: Bilateral pelvic phleboliths. No ascites. No free air. Musculoskeletal: Advanced degenerative disc disease L4-5, and mild spondylitic changes L5-S1. No fracture or other acute bone abnormality. IMPRESSION: 1. No active bleeding  into bowel. 2. Right nephrolithiasis without hydronephrosis. 3. Focal persistent area of nondistention and possible circumferential mild wall thickening at the descending/sigmoid junction of the colon. Follow-up elective colonoscopy or BE may be useful to exclude mucosal lesion. 4. 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 Electronically Signed   By: Lucrezia Europe M.D.   On: 05/05/2019 09:55    EKG:   Orders placed or performed during the hospital encounter of 05/05/19  . EKG 12-Lead  . EKG 12-Lead  . ED EKG  . ED EKG    ASSESSMENT AND PLAN:    1.  Upper GI bleed with tachycardia. IV fluids are continued after boluses Protonix drip Seen by gastroenterology Dr. Marius Ditch recommending to continue Protonix drip, full liquid diet and considering EGD and colonoscopy after cardiology clearance.  Bowel prep following cardiology clearance Serial hemoglobins hemoglobin 11.6-9.8-9.1 and transfuse as needed Patient advised to stop BC powder and Goody powder. 2.  Elevated troponin.  Troponins 82- 128-26  likely from GI bleed and demand ischemia.  Seen by cardiology [facility], discussed with Dr. Candis Musa regarding cardiac clearance  3.    Orthostatic hypotension -status post fluid boluses hydrate with IV fluids 4chronic pain. continue meds. 4.  Low TSH.  Recommend outpatient follow-up. 5.    Iron deficiency anemia from acute upper GI bleed  Patient's iron is at 56 and ferritin level at 14 IV iron supplementation 6.  6.Hypertension history.  Blood pressure on the lower side.  Hold Norvasc  DVT prophylaxis with SCDs  All the records are reviewed and  case discussed with Care Management/Social Workerr. Management plans discussed with the patient, girl friend  At bed side and they are in agreement.  CODE STATUS: FC   TOTAL TIME TAKING CARE OF THIS PATIENT: 36  minutes.   POSSIBLE D/C IN  1-2 DAYS, DEPENDING ON CLINICAL CONDITION.  Note: This dictation was prepared with Dragon dictation along with smaller phrase technology. Any transcriptional errors that result from this process are unintentional.   Nicholes Mango M.D on 05/06/2019 at 3:40 PM  Between 7am to 6pm - Pager - [phone] After 6pm go to [email or web address] - password [email or web address] [facility]  Office  [phone]  CC: Primary care physician; Patient, No Pcp Per

## 2019-05-06 NOTE — Plan of Care (Signed)

## 2019-05-06 NOTE — Plan of Care (Signed)
Patient given welcome packet, discussed plan of care and necessary interventions. Instructed patient on how to address pain, oriented to hospital room, and provided emotional support as patient expressed anxiety about hospitalization.

## 2019-05-06 NOTE — Progress Notes (Signed)
Progress Note  Patient Name: Stephen Avila Date of Encounter: 05/06/2019  Primary Cardiologist: [facility], Dr. Rockey Situ rounding.  Subjective   Patient is anxiously awaiting EGD. No chest pain or SOB.  No current racing HR or palpitations but does report his heart often races.  No current diaphoresis but does report a history of diaphoresis.  Reports pedal edema. Reviewed functional capacity and ADLs as below.  Reviewed echo results as below. Preoperative evaluation completed as below.  Inpatient Medications    Scheduled Meds:  buprenorphine-naloxone  1 tablet Sublingual Daily   [START ON 05/07/2019] influenza vac split quadrivalent PF  0.5 mL Intramuscular Tomorrow-1000   [START ON 05/08/2019] pantoprazole  40 mg Intravenous Q12H   polyethylene glycol  17 g Oral BID   potassium chloride  20 mEq Oral Once   Continuous Infusions:  sodium chloride 100 mL/hr at 05/06/19 1359   pantoprozole (PROTONIX) infusion 8 mg/hr (05/06/19 1049)   PRN Meds: acetaminophen **OR** acetaminophen, ondansetron **OR** ondansetron (ZOFRAN) IV   Vital Signs    Vitals:   05/05/19 2045 05/05/19 2237 05/06/19 0337 05/06/19 0748  BP: 119/78 111/68 102/66 102/67  Pulse: 100 (!) 115 94 89  Resp: 20  20 18   Temp: 98.1 F (36.7 C)  98 F (36.7 C) 98.1 F (36.7 C)  TempSrc: Oral  Oral Oral  SpO2: 99% 99% 97% 95%  Weight: 88.3 kg  88.5 kg   Height: 5\' 6"  (1.676 m)       Intake/Output Summary (Last 24 hours) at 05/06/2019 1404 Last data filed at 05/06/2019 0900 Gross per 24 hour  Intake 620 ml  Output 1250 ml  Net -630 ml   Last 3 Weights 05/06/2019 05/05/2019 11/07/2015  Weight (lbs) 195 lb 1.6 oz 194 lb 9.6 oz 203 lb  Weight (kg) 88.497 kg 88.27 kg 92.08 kg      Telemetry    SR-ST with rates into the low 100s - Personally Reviewed  ECG    No new tracings - Personally Reviewed  Physical Exam   GEN: No acute distress.   Neck: No JVD Cardiac: tachycardic but regular,  2/6 systolic murmur. No rubs or gallops.  Respiratory: Clear to auscultation bilaterally. GI: Soft, nontender, non-distended  MS: No significant bilateral edema; No deformity. Neuro:  Nonfocal  Psych: Normal affect   Labs    High Sensitivity Troponin:   Recent Labs  Lab 05/05/19 0745 05/05/19 0947  TROPONINIHS 82* 128*      Cardiac EnzymesNo results for input(s): TROPONINI in the last 168 hours. No results for input(s): TROPIPOC in the last 168 hours.   Chemistry Recent Labs  Lab 05/05/19 0745 05/05/19 0947 05/06/19 0423  NA 140  --  139  K 4.2  --  3.4*  CL 110  --  110  CO2 19*  --  24  GLUCOSE 118*  --  84  BUN 63*  --  29*  CREATININE 0.99  --  0.82  CALCIUM 8.1*  --  7.6*  PROT  --  4.9*  --   ALBUMIN  --  2.7*  --   AST  --  19  --   ALT  --  18  --   ALKPHOS  --  37*  --   BILITOT  --  0.5  --   GFRNONAA >60  --  >60  GFRAA >60  --  >60  ANIONGAP 11  --  5     Hematology Recent Labs  Lab 05/05/19 0713 05/05/19 1137 05/05/19 1946 05/06/19 0423  WBC 12.8*  --   --  7.3  RBC 4.70  --   --  3.17*  HGB 13.5 11.6* 9.8* 9.1*  HCT 40.6  --   --  27.0*  MCV 86.4  --   --  85.2  MCH 28.7  --   --  28.7  MCHC 33.3  --   --  33.7  RDW 13.8  --   --  14.3  PLT 317  --   --  224    BNPNo results for input(s): BNP, PROBNP in the last 168 hours.   DDimer No results for input(s): DDIMER in the last 168 hours.   Radiology    Dg Chest 2 View  Result Date: 05/05/2019 CLINICAL DATA:  Tachycardia. EXAM: CHEST - 2 VIEW COMPARISON:  None available currently. FINDINGS: The heart size and mediastinal contours are within normal limits. Both lungs are clear. No pneumothorax or pleural effusion is noted. The visualized skeletal structures are unremarkable. IMPRESSION: No active cardiopulmonary disease. Electronically Signed   By: Marijo Conception M.D.   On: 05/05/2019 07:47   Ct Angio Abd/pel W And/or Wo Contrast  Result Date: 05/05/2019 CLINICAL DATA:  Acute GI  bleed, diarrhea. History of nephrolithiasis, cholecystectomy. EXAM: CTA ABDOMEN AND PELVIS WITHOUT AND WITH CONTRAST TECHNIQUE: Multidetector CT imaging of the abdomen and pelvis was performed using the standard protocol during bolus administration of intravenous contrast. Multiplanar reconstructed images and MIPs were obtained and reviewed to evaluate the vascular anatomy. CONTRAST:  166mL OMNIPAQUE IOHEXOL 350 MG/ML SOLN COMPARISON:  06/11/2015 FINDINGS: VASCULAR Aorta: Normal caliber aorta without aneurysm, dissection, vasculitis or significant stenosis. Celiac: Patent without evidence of aneurysm, dissection, vasculitis or significant stenosis. SMA: Patent without evidence of aneurysm, dissection, vasculitis or significant stenosis. Renals: Both renal arteries are patent without evidence of aneurysm, dissection, vasculitis, fibromuscular dysplasia or significant stenosis. IMA: Patent without evidence of aneurysm, dissection, vasculitis or significant stenosis. Inflow: Minimal calcified plaque in the common iliac arteries. No aneurysm, dissection, or stenosis. Proximal Outflow: Bilateral common femoral and visualized portions of the superficial and profunda femoral arteries are patent without evidence of aneurysm, dissection, vasculitis or significant stenosis. Veins: Patent hepatic veins, portal vein, SMV, splenic vein, bilateral renal veins. IVC and iliac venous system unremarkable. No venous pathology evident. Review of the MIP images confirms the above findings. NON-VASCULAR Lower chest: No acute abnormality. Hepatobiliary: No focal liver abnormality is seen. Status post cholecystectomy. No biliary dilatation. Pancreas: Unremarkable. No pancreatic ductal dilatation or surrounding inflammatory changes. Spleen: Normal in size without focal abnormality. Adrenals/Urinary Tract: Normal adrenals. Subcentimeter probable cyst, mid right kidney. 4 mm calculus, lower pole right renal collecting system. No  hydronephrosis or ureterectasis. Urinary bladder incompletely distended. Stomach/Bowel: Stomach is distended by ingested material. The small bowel is nondilated. Normal appendix. The colon is nondilated. Short-segment of incomplete distension and possible wall thickening near the descending/sigmoid junction on all 3 phases of the scan. No evidence of active hemorrhage into the bowel. Lymphatic: No abdominal or pelvic adenopathy. Reproductive: Mild prostate enlargement with central coarse calcification. Other: Bilateral pelvic phleboliths. No ascites. No free air. Musculoskeletal: Advanced degenerative disc disease L4-5, and mild spondylitic changes L5-S1. No fracture or other acute bone abnormality. IMPRESSION: 1. No active bleeding  into bowel. 2. Right nephrolithiasis without hydronephrosis. 3. Focal persistent area of nondistention and possible circumferential mild wall thickening at the descending/sigmoid junction of the colon. Follow-up elective colonoscopy or BE may  be useful to exclude mucosal lesion. 4. 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 Electronically Signed   By: Lucrezia Europe M.D.   On: 05/05/2019 09:55    Cardiac Studies   Echo 05/06/2019  1. Left ventricular ejection fraction, by visual estimation, is 60 to 65%. The left ventricle has normal function. There is no left ventricular hypertrophy.  2. Global right ventricle has normal systolic function.The right ventricular size is normal. No increase in right ventricular wall thickness.  3. Left atrial size was normal.  4. TR signal is inadequate for assessing pulmonary artery systolic pressure.  Patient Profile     43 y.o. male with no known history of heart disease and a hx of hyperthyroidism, pituitary insufficiency, GERD, constipation, and kidney stones who is being seen today for the evaluation of troponin and shortness of breath with active GIB and need for preoperative evaluation before EGD per GI.  Assessment & Plan      Preoperative evaluation for EGD --Preoperative evaluation requested by gastroenterology prior to EGD.  As below, elevated troponin this admission likely supply demand ischemia in the setting of active GI bleed. Tn now down-trending. No chest pain at any time.  --EKG is without acute ST or T changes. Telemetry without arrhythmia. --Echo with normal EF 60-65% as above. No RWMA. No significant valvular dz. --He is without clinical risk factors. Not a previous or current smoker. Rare alcohol use. Renal function stable. No insulin use. Repeated HS Tn and now down-trending. Peaked at 128. --Functional capacity is good and at least 10 METs. He can climb a flight of stairs, walk a block, lift at least 60lbs. He reports knee and back pain that limits physical activity but no h/o exertional CP or DOE. [facility] activity status index 58.2 if not limited by back and knee pain. --Surgery specific risk low with EGD <1% --RCRI: Class I Risk with 0.4% risk of major cardiac event.  --Reasonable to proceed with endoscopy without further cardiac workup at this time.  Elevated HS Tn without CP --No current CP.  Reports shortness of breath in the setting of active GI bleed.   --EKG shows ST and T wave changes consistent with that of previous EKGs and severe LVH. --High-sensitivity troponin now down-trending. Peaked at 128. --Suspicion at this time is for supply demand ischemia in the setting of known GI bleed.   --Echo with normal EF. No plan for further ischemic work-up at this time.  --Agree with PPI.  Agree with EGD. Recommended discontinue home ASA powder.    Hypokalemia --Replete with goal 4.0.  --Check Mg. --Daily BMET.  Hypotension --BP soft in setting of active GIB and dehydration.  --Monitor to prevent prerenal AKI.  GIB --Per IM / GI. EGD pending. Daily CBC.  Sinus tachycardia --TSH 0.033. T4 nl. --Consider also as occurring 2/2 GIB and dehydration, given his associated hypotenion.  --Will  monitor on telemetry. --Per IM/PCP.  For questions or updates, please contact [place] Please consult [email or web address] for contact info under        Signed, Arvil Chaco, PA-C  05/06/2019, 2:04 PM

## 2019-05-07 ENCOUNTER — Inpatient Hospital Stay: Payer: BC Managed Care – PPO

## 2019-05-07 ENCOUNTER — Encounter
Admission: EM | Disposition: A | Discharge status: Home / Self Care | Payer: Self-pay | Source: Home / Self Care | Attending: Internal Medicine

## 2019-05-07 DIAGNOSIS — R933 Abnormal findings on diagnostic imaging of other parts of digestive tract: Secondary | ICD-10-CM

## 2019-05-07 DIAGNOSIS — K922 Gastrointestinal hemorrhage, unspecified: Secondary | ICD-10-CM | POA: Diagnosis not present

## 2019-05-07 HISTORY — PX: COLONOSCOPY WITH PROPOFOL: SHX5780

## 2019-05-07 HISTORY — PX: ESOPHAGOGASTRODUODENOSCOPY: SHX5428

## 2019-05-07 LAB — CBC WITH DIFFERENTIAL/PLATELET
Abs Immature Granulocytes: 0.04 10*3/uL (ref 0.00–0.07)
Basophils Absolute: 0.1 10*3/uL (ref 0.0–0.1)
Basophils Relative: 1 %
Eosinophils Absolute: 0.1 10*3/uL (ref 0.0–0.5)
Eosinophils Relative: 1 %
HCT: 24.6 % — ABNORMAL LOW (ref 39.0–52.0)
Hemoglobin: 8 g/dL — ABNORMAL LOW (ref 13.0–17.0)
Immature Granulocytes: 1 %
Lymphocytes Relative: 28 %
Lymphs Abs: 1.8 10*3/uL (ref 0.7–4.0)
MCH: 28.7 pg (ref 26.0–34.0)
MCHC: 32.5 g/dL (ref 30.0–36.0)
MCV: 88.2 fL (ref 80.0–100.0)
Monocytes Absolute: 0.4 10*3/uL (ref 0.1–1.0)
Monocytes Relative: 7 %
Neutro Abs: 4.1 10*3/uL (ref 1.7–7.7)
Neutrophils Relative %: 62 %
Platelets: 212 10*3/uL (ref 150–400)
RBC: 2.79 MIL/uL — ABNORMAL LOW (ref 4.22–5.81)
RDW: 14.3 % (ref 11.5–15.5)
WBC: 6.5 10*3/uL (ref 4.0–10.5)
nRBC: 0 % (ref 0.0–0.2)

## 2019-05-07 LAB — BASIC METABOLIC PANEL
Anion gap: 6 (ref 5–15)
BUN: 12 mg/dL (ref 6–20)
CO2: 26 mmol/L (ref 22–32)
Calcium: 7.9 mg/dL — ABNORMAL LOW (ref 8.9–10.3)
Chloride: 110 mmol/L (ref 98–111)
Creatinine, Ser: 0.89 mg/dL (ref 0.61–1.24)
GFR calc Af Amer: >60 mL/min (ref >60)
GFR calc non Af Amer: >60 mL/min (ref >60)
Glucose, Bld: 90 mg/dL (ref 70–99)
Potassium: 3.7 mmol/L (ref 3.5–5.1)
Sodium: 142 mmol/L (ref 135–145)

## 2019-05-07 SURGERY — EGD (ESOPHAGOGASTRODUODENOSCOPY)
Anesthesia: General

## 2019-05-07 MED ORDER — FERROUS SULFATE 325 (65 FE) MG PO TBEC: 325.0000 mg | DELAYED_RELEASE_TABLET | Freq: Two times a day (BID) | ORAL | 3 refills | Status: DC

## 2019-05-07 MED ORDER — OMEPRAZOLE 40 MG PO CPDR: 40.0000 mg | DELAYED_RELEASE_CAPSULE | Freq: Two times a day (BID) | ORAL | 0 refills | Status: DC

## 2019-05-07 MED ORDER — LIDOCAINE HCL (CARDIAC) PF 100 MG/5ML IV SOSY
PREFILLED_SYRINGE | INTRAVENOUS | Status: DC | PRN
  Administered 2019-05-07: 50 mg via INTRAVENOUS

## 2019-05-07 MED ORDER — PROPOFOL 10 MG/ML IV BOLUS
INTRAVENOUS | Status: AC
  Filled 2019-05-07: qty 20

## 2019-05-07 MED ORDER — SODIUM CHLORIDE 0.9 % IV SOLN
INTRAVENOUS | Status: DC
  Administered 2019-05-07: 14:00:00 via INTRAVENOUS

## 2019-05-07 MED ORDER — CYANOCOBALAMIN 1000 MCG PO TABS: 1000.0000 ug | ORAL_TABLET | Freq: Every day | ORAL | 0 refills | Status: DC

## 2019-05-07 MED ORDER — DOCUSATE SODIUM 100 MG PO CAPS: 100.0000 mg | ORAL_CAPSULE | Freq: Two times a day (BID) | ORAL | 0 refills | Status: DC | PRN

## 2019-05-07 MED ORDER — ACETAMINOPHEN 325 MG PO TABS: 650.0000 mg | ORAL_TABLET | Freq: Four times a day (QID) | ORAL | Status: DC | PRN

## 2019-05-07 MED ORDER — PROPOFOL 500 MG/50ML IV EMUL
INTRAVENOUS | Status: DC | PRN
  Administered 2019-05-07: 170 ug/kg/min via INTRAVENOUS

## 2019-05-07 MED ORDER — PHENYLEPHRINE HCL (PRESSORS) 10 MG/ML IV SOLN
INTRAVENOUS | Status: DC | PRN
  Administered 2019-05-07 (×3): 100 ug via INTRAVENOUS

## 2019-05-07 MED ORDER — PROPOFOL 10 MG/ML IV BOLUS
INTRAVENOUS | Status: DC | PRN
  Administered 2019-05-07: 50 mg via INTRAVENOUS
  Administered 2019-05-07: 40 mg via INTRAVENOUS
  Administered 2019-05-07: 60 mg via INTRAVENOUS

## 2019-05-07 MED ORDER — FLUTICASONE PROPIONATE 50 MCG/ACT NA SUSP: 2.0000 | Freq: Every day | NASAL | 0 refills | Status: DC

## 2019-05-07 MED ORDER — FLUTICASONE PROPIONATE 50 MCG/ACT NA SUSP
2.0000 | Freq: Every day | NASAL | Status: DC
  Filled 2019-05-07: qty 16

## 2019-05-07 NOTE — Progress Notes (Signed)
[place] [place] NAME: Stephen Avila    MR#:  [id number]  DATE OF BIRTH:  1976-07-05  SUBJECTIVE:  CHIEF COMPLAINT: Patient is noticing black liquidy stool Very nervous, awaiting for procedures today  REVIEW OF SYSTEMS:  CONSTITUTIONAL: No fever, fatigue or weakness.  EYES: No blurred or double vision.  EARS, NOSE, AND THROAT: No tinnitus or ear pain.  RESPIRATORY: No cough, shortness of breath, wheezing or hemoptysis.  CARDIOVASCULAR: No chest pain, orthopnea, edema.  GASTROINTESTINAL: No nausea, vomiting, diarrhea or abdominal pain.  GENITOURINARY: No dysuria, hematuria.  ENDOCRINE: No polyuria, nocturia,  HEMATOLOGY: No anemia, easy bruising or bleeding SKIN: No rash or lesion. MUSCULOSKELETAL: No joint pain or arthritis.   NEUROLOGIC: No tingling, numbness, weakness.  PSYCHIATRY: No anxiety or depression.   DRUG ALLERGIES:  No Known Allergies  VITALS:  Blood pressure 122/68, pulse (!) 108, temperature 97.8 F (36.6 C), temperature source Temporal, resp. rate 18, height 5\' 6"  (1.676 m), weight 88.5 kg, SpO2 100 %.  PHYSICAL EXAMINATION:  GENERAL:  43 y.o.-year-old patient lying in the bed with no acute distress.  EYES: Pupils equal, round, reactive to light and accommodation. No scleral icterus. Extraocular muscles intact.  HEENT: Head atraumatic, normocephalic. Oropharynx and nasopharynx clear.  NECK:  Supple, no jugular venous distention. No thyroid enlargement, no tenderness.  LUNGS: Normal breath sounds bilaterally, no wheezing, rales,rhonchi or crepitation. No use of accessory muscles of respiration.  CARDIOVASCULAR: S1, S2 normal. No murmurs, rubs, or gallops.  ABDOMEN: Soft, nontender, nondistended. Bowel sounds present.  EXTREMITIES: No pedal edema, cyanosis, or clubbing.  NEUROLOGIC: Cranial nerves II through XII are intact. Muscle strength 5/5 in all extremities. Sensation intact. Gait not checked.   PSYCHIATRIC: The patient is alert and oriented x 3.  SKIN: No obvious rash, lesion, or ulcer.    LABORATORY PANEL:   CBC Recent Labs  Lab 05/07/19 0513  WBC 6.5  HGB 8.0*  HCT 24.6*  PLT 212   ------------------------------------------------------------------------------------------------------------------  Chemistries  Recent Labs  Lab 05/05/19 0947  05/07/19 0513  NA  --    < > 142  K  --    < > 3.7  CL  --    < > 110  CO2  --    < > 26  GLUCOSE  --    < > 90  BUN  --    < > 12  CREATININE  --    < > 0.89  CALCIUM  --    < > 7.9*  AST 19  --   --   ALT 18  --   --   ALKPHOS 37*  --   --   BILITOT 0.5  --   --    < > = values in this interval not displayed.   ------------------------------------------------------------------------------------------------------------------  Cardiac Enzymes No results for input(s): TROPONINI in the last 168 hours. ------------------------------------------------------------------------------------------------------------------  RADIOLOGY:  No results found.  EKG:   Orders placed or performed during the hospital encounter of 05/05/19  . EKG 12-Lead  . EKG 12-Lead  . ED EKG  . ED EKG    ASSESSMENT AND PLAN:    1.  Upper GI bleed with tachycardia. IV fluids are continued after boluses Protonix drip Seen by gastroenterology Dr. Marius Ditch recommending to continue Protonix drip, scheduled for EGD and colonoscopy  Today Cleared by cardiology  serial hemoglobins hemoglobin 11.6-9.8-9.1-9.3-9.0 and transfuse as needed Patient advised to stop BC powder and Goody powder.  2.  Elevated troponin.  Troponins 82- 128-26  likely from GI bleed and demand ischemia.  Seen by cardiology [facility], discussed with Dr. Candis Musa cleared for the procedure 3.    Orthostatic hypotension -status post fluid boluses hydrate with IV fluids 4chronic pain. continue meds. 4.  Low TSH.  Recommend outpatient follow-up. 5.    Iron deficiency anemia from acute  upper GI bleed  Patient's iron is at 56 and ferritin level at 14 IV iron supplementation 6.  6.Hypertension history.  Blood pressure on the lower side.  Hold Norvasc  DVT prophylaxis with SCDs  All the records are reviewed and case discussed with Care Management/Social Workerr. Management plans discussed with the patient, girl friend  At bed side and they are in agreement.  CODE STATUS: FC   TOTAL TIME TAKING CARE OF THIS PATIENT: 36  minutes.   POSSIBLE D/C IN  1-2 DAYS, DEPENDING ON CLINICAL CONDITION.  Note: This dictation was prepared with Dragon dictation along with smaller phrase technology. Any transcriptional errors that result from this process are unintentional.   Nicholes Mango M.D on 05/07/2019 at 1:55 PM  Between 7am to 6pm - Pager - [phone] After 6pm go to [email or web address] - password [email or web address] [facility]  Office  [phone]  CC: Primary care physician; Patient, No Pcp Per

## 2019-05-07 NOTE — Op Note (Signed)
[facility] Gastroenterology Patient Name: Stephen Avila Procedure Date: 05/07/2019 1:52 PM MRN: [id number] Account #: [id number] Date of Birth: March 15, 1976 Admit Type: Inpatient Age: 43 Room: [facility] ENDO ROOM 3 Gender: Male Note Status: Finalized Procedure:            Colonoscopy Indications:          Acute post hemorrhagic anemia, Abnormal CT of the GI                        tract Providers:            [name] MD, MD Referring MD:         No Local Md, MD (Referring MD) Medicines:            Monitored Anesthesia Care Complications:        No immediate complications. Estimated blood loss: None. Procedure:            Pre-Anesthesia Assessment:                       - Prior to the procedure, a History and Physical was                        performed, and patient medications and allergies were                        reviewed. The patient is competent. The risks and                        benefits of the procedure and the sedation options and                        risks were discussed with the patient. All questions                        were answered and informed consent was obtained.                        Patient identification and proposed procedure were                        verified by the physician, the nurse, the                        anesthesiologist, the anesthetist and the technician in                        the pre-procedure area in the procedure room in the                        endoscopy suite. Mental Status Examination: alert and                        oriented. Airway Examination: normal oropharyngeal                        airway and neck mobility. Respiratory Examination:                        clear to auscultation. CV Examination: normal.  Prophylactic Antibiotics: The patient does not require                        prophylactic antibiotics. Prior Anticoagulants: The                        patient has taken no  previous anticoagulant or                        antiplatelet agents. ASA Grade Assessment: III - A                        patient with severe systemic disease. After reviewing                        the risks and benefits, the patient was deemed in                        satisfactory condition to undergo the procedure. The                        anesthesia plan was to use monitored anesthesia care                        (MAC). Immediately prior to administration of                        medications, the patient was re-assessed for adequacy                        to receive sedatives. The heart rate, respiratory rate,                        oxygen saturations, blood pressure, adequacy of                        pulmonary ventilation, and response to care were                        monitored throughout the procedure. The physical status                        of the patient was re-assessed after the procedure.                       After obtaining informed consent, the colonoscope was                        passed under direct vision. Throughout the procedure,                        the patient's blood pressure, pulse, and oxygen                        saturations were monitored continuously. The                        Colonoscope was introduced through the anus and  advanced to the the terminal ileum, with identification                        of the appendiceal orifice and IC valve. The                        colonoscopy was performed without difficulty. The                        patient tolerated the procedure well. The quality of                        the bowel preparation was poor. Findings:      The perianal and digital rectal examinations were normal. Pertinent       negatives include normal sphincter tone and no palpable rectal lesions.      The entire examined colon appeared normal.      The retroflexed view of the distal rectum and anal verge was normal  and       showed no anal or rectal abnormalities.      Copious quantities of liquid stool was found in the entire colon,       precluding visualization. Impression:           - Preparation of the colon was poor.                       - The entire examined colon is normal.                       - The distal rectum and anal verge are normal on                        retroflexion view.                       - Stool in the entire examined colon.                       - No specimens collected. Recommendation:       - Discharge patient to home.                       - Return patient to hospital ward for ongoing care.                       - Resume regular diet today.                       - Continue present medications.                       - Repeat colonoscopy in 10 years for surveillance.                       - High fiber diet.                       - Return to GI office in 1 month. Procedure Code(s):    --- Professional ---                       [phone], Colonoscopy, flexible; diagnostic, including  collection of specimen(s) by brushing or washing, when                        performed (separate procedure) CPT copyright 2019 [employer]. All rights reserved. The codes documented in this report are preliminary and upon coder review may  be revised to meet current compliance requirements. Dr. Ulyess Mort [name] MD, MD 05/07/2019 2:37:15 PM This report has been signed electronically. Number of Addenda: 0 Note Initiated On: 05/07/2019 1:52 PM Scope Withdrawal Time: 0 hours 5 minutes 24 seconds  Total Procedure Duration: 0 hours 12 minutes 18 seconds  Estimated Blood Loss: Estimated blood loss: none.      [facility] ([facility])

## 2019-05-07 NOTE — Op Note (Signed)
[facility]astroenter[facility] Patient Name: Stephen Avila Procedure Date: 05/07/2019 1:52 PM MRN: [id number] Account #: [id number] Date of Birth: Oct 07, 1975 Admit Type: Inpatient Age: 43 Room: [facility] ENDO ROOM 3 Gender: Male Note Status: Finalized Procedure:            Upper GI endoscopy Indications:          Acute post hemorrhagic anemia, Iron deficiency anemia                        due to suspected upper gastrointestinal bleeding, Melena Providers:            [name] MD, MD Referring MD:         No Local Md, MD (Referring MD) Medicines:            Monitored Anesthesia Care Complications:        No immediate complications. Estimated blood loss: None. Procedure:            Pre-Anesthesia Assessment:                       - Prior to the procedure, a History and Physical was                        performed, and patient medications and allergies were                        reviewed. The patient is competent. The risks and                        benefits of the procedure and the sedation options and                        risks were discussed with the patient. All questions                        were answered and informed consent was obtained.                        Patient identification and proposed procedure were                        verified by the physician, the nurse, the                        anesthesiologist, the anesthetist and the technician in                        the pre-procedure area in the procedure room in the                        endoscopy suite. Mental Status Examination: alert and                        oriented. Airway Examination: normal oropharyngeal                        airway and neck mobility. Respiratory Examination:                        clear to auscultation. CV Examination: normal.  Prophylactic Antibiotics: The patient does not require                        prophylactic antibiotics. Prior  Anticoagulants: The                        patient has taken BC powder, last dose was 2 days prior                        to procedure. ASA Grade Assessment: III - A patient                        with severe systemic disease. After reviewing the risks                        and benefits, the patient was deemed in satisfactory                        condition to undergo the procedure. The anesthesia plan                        was to use monitored anesthesia care (MAC). Immediately                        prior to administration of medications, the patient was                        re-assessed for adequacy to receive sedatives. The                        heart rate, respiratory rate, oxygen saturations, blood                        pressure, adequacy of pulmonary ventilation, and                        response to care were monitored throughout the                        procedure. The physical status of the patient was                        re-assessed after the procedure.                       After obtaining informed consent, the endoscope was                        passed under direct vision. Throughout the procedure,                        the patient's blood pressure, pulse, and oxygen                        saturations were monitored continuously. The Endoscope                        was introduced through the mouth, and advanced to the  second part of duodenum. The upper GI endoscopy was                        accomplished without difficulty. The patient tolerated                        the procedure well. Findings:      Few non-bleeding cratered duodenal ulcers with a clean ulcer base       (Forrest Class III) were found in the duodenal bulb causing stenosis       across the duodenal sweep, scope traversed through D2 with mild       resistance      Few non-bleeding cratered, linear and superficial gastric ulcers with a       clean ulcer base (Forrest Class  III) were found in the gastric body and       in the gastric antrum. The largest lesion was 5 mm in largest dimension.       Biopsies were taken with a cold forceps for Helicobacter pylori testing.      The cardia and gastric fundus were normal on retroflexion.      Esophagogastric landmarks were identified: the gastroesophageal junction       was found at 39 cm from the incisors.      The gastroesophageal junction and examined esophagus were normal. Impression:           - Non-bleeding duodenal ulcers with a clean ulcer base                        (Forrest Class III).                       - Non-bleeding gastric ulcers with a clean ulcer base                        (Forrest Class III). Biopsied.                       - Esophagogastric landmarks identified.                       - Normal gastroesophageal junction and esophagus. Recommendation:       - Await pathology results.                       - Use Prilosec (omeprazole) 40 mg PO BID for 3 months.                       - No ibuprofen, naproxen, or other non-steroidal                        anti-inflammatory drugs.                       - Proceed with colonoscopy as scheduled                       See colonoscopy report Procedure Code(s):    --- Professional ---                       [phone], Esophagogastroduodenoscopy, flexible, transoral;  with biopsy, single or multiple Diagnosis Code(s):    --- Professional ---                       K26.9, Duodenal ulcer, unspecified as acute or chronic,                        without hemorrhage or perforation                       K25.9, Gastric ulcer, unspecified as acute or chronic,                        without hemorrhage or perforation                       D62, Acute posthemorrhagic anemia                       D50.9, Iron deficiency anemia, unspecified                       K92.1, Melena (includes Hematochezia) CPT copyright 2019 [employer]. All  rights reserved. The codes documented in this report are preliminary and upon coder review may  be revised to meet current compliance requirements. Dr. Ulyess Mort [name] MD, MD 05/07/2019 2:20:37 PM This report has been signed electronically. Number of Addenda: 0 Note Initiated On: 05/07/2019 1:52 PM Estimated Blood Loss: Estimated blood loss: none.      [facility]

## 2019-05-07 NOTE — Progress Notes (Addendum)
Tap water enema administered at this time. Will continue to monitor. [place]  Patient PREFERRED to self-administer enema after I set up all required supplies. [facility]

## 2019-05-07 NOTE — Discharge Summary (Signed)
[place] [facility] [place] NAME: Stephen Avila    MR#:  [id number]  DATE OF BIRTH:  1976/05/30  DATE OF ADMISSION:  05/05/2019 ADMITTING PHYSICIAN: Loletha Grayer, MD  DATE OF DISCHARGE: 05/07/2019  PRIMARY CARE PHYSICIAN: Patient, No Pcp Per    ADMISSION DIAGNOSIS:  Acute GI bleeding [K92.2] Upper GI bleed [K92.2]  DISCHARGE DIAGNOSIS:  Active Problems:   Upper GI bleed   SECONDARY DIAGNOSIS:   Past Medical History:  Diagnosis Date   Anxiety    Constipation    GERD (gastroesophageal reflux disease)    Hypertension    Kidney stones    Kidney stones     HOSPITAL COURSE:   HPI  Stephen Avila  is a 43 y.o. male coming in with waking up at 3:30 in the morning swelling.  He felt weak in his knees.  Then he started noticing dark blood which was black with bowel movement.  No abdominal pain.  Some nausea but no vomiting.  No chest pain.  In the ER he had 3 more episodes of black bowel movements.  He has not gotten any sleep last night.  He does take Goody powders or BC powder 5-6 times a day.  The last time he ate was yesterday 2-3 in the afternoon yesterday.  In the ER, he is tachycardic and troponin was found to be elevated.  Hospitalist services contacted for further evaluation  Hospital course  Expand All Collapse All    Show:Clear all [x] Manual[] Template[] Copied  Added by: [x] Nicholes Mango, MD  [] Hover for details [place] [facility] [place] NAME: Stephen Avila    MR#:  [id number]  DATE OF BIRTH:  Dec 02, 1975  SUBJECTIVE:  CHIEF COMPLAINT: Patient is noticing black liquidy stool Very nervous, awaiting for procedures today  REVIEW OF SYSTEMS:  CONSTITUTIONAL: No fever, fatigue or weakness.  EYES: No blurred or double vision.  EARS, NOSE, AND THROAT: No tinnitus or ear pain.  RESPIRATORY: No cough, shortness of breath, wheezing or hemoptysis.    CARDIOVASCULAR: No chest pain, orthopnea, edema.  GASTROINTESTINAL: No nausea, vomiting, diarrhea or abdominal pain.  GENITOURINARY: No dysuria, hematuria.  ENDOCRINE: No polyuria, nocturia,  HEMATOLOGY: No anemia, easy bruising or bleeding SKIN: No rash or lesion. MUSCULOSKELETAL: No joint pain or arthritis.   NEUROLOGIC: No tingling, numbness, weakness.  PSYCHIATRY: No anxiety or depression.   DRUG ALLERGIES:  No Known Allergies  VITALS:  Blood pressure 122/68, pulse (!) 108, temperature 97.8 F (36.6 C), temperature source Temporal, resp. rate 18, height 5\' 6"  (1.676 m), weight 88.5 kg, SpO2 100 %.  PHYSICAL EXAMINATION:  GENERAL:  43 y.o.-year-old patient lying in the bed with no acute distress.  EYES: Pupils equal, round, reactive to light and accommodation. No scleral icterus. Extraocular muscles intact.  HEENT: Head atraumatic, normocephalic. Oropharynx and nasopharynx clear.  NECK:  Supple, no jugular venous distention. No thyroid enlargement, no tenderness.  LUNGS: Normal breath sounds bilaterally, no wheezing, rales,rhonchi or crepitation. No use of accessory muscles of respiration.  CARDIOVASCULAR: S1, S2 normal. No murmurs, rubs, or gallops.  ABDOMEN: Soft, nontender, nondistended. Bowel sounds present.  EXTREMITIES: No pedal edema, cyanosis, or clubbing.  NEUROLOGIC: Cranial nerves II through XII are intact. Muscle strength 5/5 in all extremities. Sensation intact. Gait not checked.  PSYCHIATRIC: The patient is alert and oriented x 3.  SKIN: No obvious rash, lesion, or ulcer.    LABORATORY PANEL:   CBC Last Labs  Recent Labs  Lab 05/07/19 0513  WBC 6.5  HGB 8.0*  HCT 24.6*  PLT 212     ------------------------------------------------------------------------------------------------------------------  Chemistries  Last Labs        Recent Labs  Lab 05/05/19 0947  05/07/19 0513  NA  --    < > 142  K  --    < > 3.7  CL  --    < > 110  CO2   --    < > 26  GLUCOSE  --    < > 90  BUN  --    < > 12  CREATININE  --    < > 0.89  CALCIUM  --    < > 7.9*  AST 19  --   --   ALT 18  --   --   ALKPHOS 37*  --   --   BILITOT 0.5  --   --    < > = values in this interval not displayed.     ------------------------------------------------------------------------------------------------------------------  Cardiac Enzymes Last Labs   No results for input(s): TROPONINI in the last 168 hours.   ------------------------------------------------------------------------------------------------------------------  RADIOLOGY:  Imaging Results (Last 48 hours)  No results found.    EKG:      Orders placed or performed during the hospital encounter of 05/05/19   EKG 12-Lead   EKG 12-Lead   ED EKG   ED EKG    ASSESSMENT AND PLAN:    1. Upper GI bleed with tachycardia. IV fluids are continued after boluses Protonix drip provided  Seen by gastroenterology Dr. Marius Ditch recommending to continue Protonix drip, scheduled for EGD and colonoscopy , EGD - ulcers  Colonoscopy - poor prep  Okay to discharge patient from gastroenterology standpoint with omeprazole p.o. twice daily for 90 days, iron supplements twice a day with stool softeners Stop taking NSAIDs completely Outpatient follow-up with gastroenterology in 4 to 6 weeks Cleared by cardiology  serial hemoglobins hemoglobin 11.6-9.8-9.1-9.3-9.0 and transfuse as needed Patient advised to stop BC powder, NSAIDs and Goody powder. 2. Elevated troponin.  Troponins 82-128-26  likely from GI bleed and demand ischemia.  Seen by cardiology [facility], discussed with Dr. Candis Musa cleared for the procedure 3.   Orthostatic hypotension -status post fluid boluses hydrate with IV fluids.  Holding blood pressure medications 4chronic pain.continue meds. 4. Low TSH. Recommend outpatient follow-up. 5.  Iron deficiency anemia from acute upper GI bleed  Patient's iron is at 56 and ferritin  level at 14 IV iron supplementation .  Patient is to continue outpatient p.o. iron supplements with stool softeners after discharge  6.Hypertension history. Blood pressure on the lower side. Hold Norvasc     DISCHARGE CONDITIONS:   Fair  CONSULTS OBTAINED:  Treatment Team:  [name], MD   PROCEDURES -  EGD - ulcers  Colonoscopy - poor prep    DRUG ALLERGIES:  No Known Allergies  DISCHARGE MEDICATIONS:   Allergies as of 05/07/2019   No Known Allergies     Medication List    STOP taking these medications   amLODipine 10 MG tablet Commonly known as: NORVASC     TAKE these medications   acetaminophen 325 MG tablet Commonly known as: TYLENOL Take 2 tablets (650 mg total) by mouth every 6 (six) hours as needed for mild pain (or Fever >/= 101).   buprenorphine 8 MG Subl SL tablet Commonly known as: SUBUTEX Place 20 mg under the tongue daily.   cyanocobalamin 1000 MCG tablet Take  1 tablet (1,000 mcg total) by mouth daily. Start taking on: May 08, 2019   docusate sodium 100 MG capsule Commonly known as: Colace Take 1 capsule (100 mg total) by mouth 2 (two) times daily as needed for mild constipation.   ferrous sulfate 325 (65 FE) MG EC tablet Take 1 tablet (325 mg total) by mouth 2 (two) times daily.   fluticasone 50 MCG/ACT nasal spray Commonly known as: FLONASE Place 2 sprays into both nostrils daily.   omeprazole 40 MG capsule Commonly known as: PriLOSEC Take 1 capsule (40 mg total) by mouth 2 (two) times daily before a meal.   testosterone cypionate 200 MG/ML injection Commonly known as: DEPOTESTOSTERONE CYPIONATE INJECT 1 ML EVERY 2 WEEKS        DISCHARGE INSTRUCTIONS:   Follow-up with primary care physician in 3 to 4 days Follow-up with gastroenterology Dr. Marius Ditch in 5 weeks Stop taking NSAIDs  DIET:  Regular diet  DISCHARGE CONDITION:  Stable  ACTIVITY:  Activity as tolerated  OXYGEN:  Home Oxygen: No.   Oxygen  Delivery: room air  DISCHARGE LOCATION:  home   If you experience worsening of your admission symptoms, develop shortness of breath, life threatening emergency, suicidal or homicidal thoughts you must seek medical attention immediately by calling 911 or calling your MD immediately  if symptoms less severe.  You Must read complete instructions/literature along with all the possible adverse reactions/side effects for all the Medicines you take and that have been prescribed to you. Take any new Medicines after you have completely understood and accpet all the possible adverse reactions/side effects.   Please note  You were cared for by a hospitalist during your hospital stay. If you have any questions about your discharge medications or the care you received while you were in the hospital after you are discharged, you can call the unit and asked to speak with the hospitalist on call if the hospitalist that took care of you is not available. Once you are discharged, your primary care physician will handle any further medical issues. Please note that NO REFILLS for any discharge medications will be authorized once you are discharged, as it is imperative that you return to your primary care physician (or establish a relationship with a primary care physician if you do not have one) for your aftercare needs so that they can reassess your need for medications and monitor your lab values.     Today  Chief Complaint  Patient presents with   Tachycardia   Nausea   Patient is feeling better after EGD and colonoscopy okay to discharge from GI standpoint.  Patient really wants to go home  ROS:  CONSTITUTIONAL: Denies fevers, chills. Denies any fatigue, weakness.  EYES: Denies blurry vision, double vision, eye pain. EARS, NOSE, THROAT: Denies tinnitus, ear pain, hearing loss. RESPIRATORY: Denies cough, wheeze, shortness of breath.  CARDIOVASCULAR: Denies chest pain, palpitations, edema.    GASTROINTESTINAL: Denies nausea, vomiting, diarrhea, abdominal pain. Denies bright red blood per rectum. GENITOURINARY: Denies dysuria, hematuria. ENDOCRINE: Denies nocturia or thyroid problems. HEMATOLOGIC AND LYMPHATIC: Denies easy bruising or bleeding. SKIN: Denies rash or lesion. MUSCULOSKELETAL: Denies pain in neck, back, shoulder, knees, hips or arthritic symptoms.  NEUROLOGIC: Denies paralysis, paresthesias.  PSYCHIATRIC: Denies anxiety or depressive symptoms.   VITAL SIGNS:  Blood pressure 111/63, pulse 81, temperature 98.1 F (36.7 C), temperature source Oral, resp. rate 19, height 5\' 6"  (1.676 m), weight 88.5 kg, SpO2 98 %.  I/O:    Intake/Output  Summary (Last 24 hours) at 05/07/2019 1648 Last data filed at 05/07/2019 0204 Gross per 24 hour  Intake 738.59 ml  Output 100 ml  Net 638.59 ml    PHYSICAL EXAMINATION:  GENERAL:  43 y.o.-year-old patient lying in the bed with no acute distress.  EYES: Pupils equal, round, reactive to light and accommodation. No scleral icterus. Extraocular muscles intact.  HEENT: Head atraumatic, normocephalic. Oropharynx and nasopharynx clear.  NECK:  Supple, no jugular venous distention. No thyroid enlargement, no tenderness.  LUNGS: Normal breath sounds bilaterally, no wheezing, rales,rhonchi or crepitation. No use of accessory muscles of respiration.  CARDIOVASCULAR: S1, S2 normal. No murmurs, rubs, or gallops.  ABDOMEN: Soft, non-tender, non-distended. Bowel sounds present.  EXTREMITIES: No pedal edema, cyanosis, or clubbing.  NEUROLOGIC: Cranial nerves II through XII are intact. Muscle strength 5/5 in all extremities. Sensation intact. Gait not checked.  PSYCHIATRIC: The patient is alert and oriented x 3.  SKIN: No obvious rash, lesion, or ulcer.   DATA REVIEW:   CBC Recent Labs  Lab 05/07/19 0513  WBC 6.5  HGB 8.0*  HCT 24.6*  PLT 212    Chemistries  Recent Labs  Lab 05/05/19 0947  05/07/19 0513  NA  --    < > 142   K  --    < > 3.7  CL  --    < > 110  CO2  --    < > 26  GLUCOSE  --    < > 90  BUN  --    < > 12  CREATININE  --    < > 0.89  CALCIUM  --    < > 7.9*  AST 19  --   --   ALT 18  --   --   ALKPHOS 37*  --   --   BILITOT 0.5  --   --    < > = values in this interval not displayed.    Cardiac Enzymes No results for input(s): TROPONINI in the last 168 hours.  Microbiology Results  Results for orders placed or performed during the hospital encounter of 05/05/19  SARS Coronavirus 2 by RT PCR (hospital order, performed in [facility] hospital lab) Nasopharyngeal Nasopharyngeal Swab     Status: None   Collection Time: 05/05/19 11:09 AM   Specimen: Nasopharyngeal Swab  Result Value Ref Range Status   SARS Coronavirus 2 NEGATIVE NEGATIVE Final    Comment: (NOTE) If result is NEGATIVE SARS-CoV-2 target nucleic acids are NOT DETECTED. The SARS-CoV-2 RNA is generally detectable in upper and lower  respiratory specimens during the acute phase of infection. The lowest  concentration of SARS-CoV-2 viral copies this assay can detect is 250  copies / mL. A negative result does not preclude SARS-CoV-2 infection  and should not be used as the sole basis for treatment or other  patient management decisions.  A negative result may occur with  improper specimen collection / handling, submission of specimen other  than nasopharyngeal swab, presence of viral mutation(s) within the  areas targeted by this assay, and inadequate number of viral copies  (<250 copies / mL). A negative result must be combined with clinical  observations, patient history, and epidemiological information. If result is POSITIVE SARS-CoV-2 target nucleic acids are DETECTED. The SARS-CoV-2 RNA is generally detectable in upper and lower  respiratory specimens dur ing the acute phase of infection.  Positive  results are indicative of active infection with SARS-CoV-2.  Clinical  correlation with  patient history and other  diagnostic information is  necessary to determine patient infection status.  Positive results do  not rule out bacterial infection or co-infection with other viruses. If result is PRESUMPTIVE POSTIVE SARS-CoV-2 nucleic acids MAY BE PRESENT.   A presumptive positive result was obtained on the submitted specimen  and confirmed on repeat testing.  While 2019 novel coronavirus  (SARS-CoV-2) nucleic acids may be present in the submitted sample  additional confirmatory testing may be necessary for epidemiological  and / or clinical management purposes  to differentiate between  SARS-CoV-2 and other Sarbecovirus currently known to infect humans.  If clinically indicated additional testing with an alternate test  methodology [phone]) is advised. The SARS-CoV-2 RNA is generally  detectable in upper and lower respiratory sp ecimens during the acute  phase of infection. The expected result is Negative. Fact Sheet for Patients:  StrictlyIdeas.no Fact Sheet for Healthcare Providers: BankingDealers.co.za This test is not yet approved or cleared by the [place] FDA and has been authorized for detection and/or diagnosis of SARS-CoV-2 by FDA under an Emergency Use Authorization (EUA).  This EUA will remain in effect (meaning this test can be used) for the duration of the COVID-19 declaration under Section 564(b)(1) of the Act, 21 U.S.C. section 360bbb-3(b)(1), unless the authorization is terminated or revoked sooner. Performed at [facility], [address] [place], [place] [address]   MRSA PCR Screening     Status: None   Collection Time: 05/06/19  6:15 AM   Specimen: Nasal Mucosa; Nasopharyngeal  Result Value Ref Range Status   MRSA by PCR NEGATIVE NEGATIVE Final    Comment:        The GeneXpert MRSA Assay (FDA approved for NASAL specimens only), is one component of a comprehensive MRSA colonization surveillance program. It is  not intended to diagnose MRSA infection nor to guide or monitor treatment for MRSA infections. Performed at [facility], [address], [place], [place] [address]     RADIOLOGY:  Dg Chest 2 View  Result Date: 05/05/2019 CLINICAL DATA:  Tachycardia. EXAM: CHEST - 2 VIEW COMPARISON:  None available currently. FINDINGS: The heart size and mediastinal contours are within normal limits. Both lungs are clear. No pneumothorax or pleural effusion is noted. The visualized skeletal structures are unremarkable. IMPRESSION: No active cardiopulmonary disease. Electronically Signed   By: Marijo Conception M.D.   On: 05/05/2019 07:47   Ct Angio Abd/pel W And/or Wo Contrast  Result Date: 05/05/2019 CLINICAL DATA:  Acute GI bleed, diarrhea. History of nephrolithiasis, cholecystectomy. EXAM: CTA ABDOMEN AND PELVIS WITHOUT AND WITH CONTRAST TECHNIQUE: Multidetector CT imaging of the abdomen and pelvis was performed using the standard protocol during bolus administration of intravenous contrast. Multiplanar reconstructed images and MIPs were obtained and reviewed to evaluate the vascular anatomy. CONTRAST:  186mL OMNIPAQUE IOHEXOL 350 MG/ML SOLN COMPARISON:  06/11/2015 FINDINGS: VASCULAR Aorta: Normal caliber aorta without aneurysm, dissection, vasculitis or significant stenosis. Celiac: Patent without evidence of aneurysm, dissection, vasculitis or significant stenosis. SMA: Patent without evidence of aneurysm, dissection, vasculitis or significant stenosis. Renals: Both renal arteries are patent without evidence of aneurysm, dissection, vasculitis, fibromuscular dysplasia or significant stenosis. IMA: Patent without evidence of aneurysm, dissection, vasculitis or significant stenosis. Inflow: Minimal calcified plaque in the common iliac arteries. No aneurysm, dissection, or stenosis. Proximal Outflow: Bilateral common femoral and visualized portions of the superficial and profunda femoral arteries are  patent without evidence of aneurysm, dissection, vasculitis or significant stenosis. Veins: Patent  hepatic veins, portal vein, SMV, splenic vein, bilateral renal veins. IVC and iliac venous system unremarkable. No venous pathology evident. Review of the MIP images confirms the above findings. NON-VASCULAR Lower chest: No acute abnormality. Hepatobiliary: No focal liver abnormality is seen. Status post cholecystectomy. No biliary dilatation. Pancreas: Unremarkable. No pancreatic ductal dilatation or surrounding inflammatory changes. Spleen: Normal in size without focal abnormality. Adrenals/Urinary Tract: Normal adrenals. Subcentimeter probable cyst, mid right kidney. 4 mm calculus, lower pole right renal collecting system. No hydronephrosis or ureterectasis. Urinary bladder incompletely distended. Stomach/Bowel: Stomach is distended by ingested material. The small bowel is nondilated. Normal appendix. The colon is nondilated. Short-segment of incomplete distension and possible wall thickening near the descending/sigmoid junction on all 3 phases of the scan. No evidence of active hemorrhage into the bowel. Lymphatic: No abdominal or pelvic adenopathy. Reproductive: Mild prostate enlargement with central coarse calcification. Other: Bilateral pelvic phleboliths. No ascites. No free air. Musculoskeletal: Advanced degenerative disc disease L4-5, and mild spondylitic changes L5-S1. No fracture or other acute bone abnormality. IMPRESSION: 1. No active bleeding  into bowel. 2. Right nephrolithiasis without hydronephrosis. 3. Focal persistent area of nondistention and possible circumferential mild wall thickening at the descending/sigmoid junction of the colon. Follow-up elective colonoscopy or BE may be useful to exclude mucosal lesion. 4. 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 Electronically Signed   By: Lucrezia Europe M.D.   On: 05/05/2019 09:55    EKG:   Orders placed or performed during the hospital encounter of  05/05/19   EKG 12-Lead   EKG 12-Lead   ED EKG   ED EKG      Management plans discussed with the patient, he is in agreement.  CODE STATUS:     Code Status Orders  (From admission, onward)         Start     Ordered   05/05/19 1108  Full code  Continuous     05/05/19 1107        Code Status History    This patient has a current code status but no historical code status.   Advance Care Planning Activity      TOTAL TIME TAKING CARE OF THIS PATIENT: 45  minutes.   Note: This dictation was prepared with Dragon dictation along with smaller phrase technology. Any transcriptional errors that result from this process are unintentional.   @[facility] @  on 05/07/2019 at 4:48 PM  Between 7am to 6pm - Pager - [phone]  After 6pm go to [email or web address] - password [email or web address] [place] [facility]  Office  [phone]  CC: Primary care physician; Patient, No Pcp Per

## 2019-05-07 NOTE — Discharge Instructions (Addendum)
Follow-up with primary care physician in 3 to 4 days Follow-up with gastroenterology Dr. Marius Ditch in 5 weeks Stop taking NSAIDs  Nausea and Vomiting, Adult Nausea is the feeling that you have an upset stomach or that you are about to vomit. Vomiting is when stomach contents are thrown up and out of the mouth as a result of nausea. Vomiting can make you feel weak and cause you to become dehydrated. Dehydration can make you feel tired and thirsty, cause you to have a dry mouth, and decrease how often you urinate. Older adults and people with other diseases or a weak disease-fighting system (immune system) are at higher risk for dehydration. It is important to treat your nausea and vomiting as told by your health care provider. Follow these instructions at home: Watch your symptoms for any changes. Tell your health care provider about them. Follow these instructions to care for yourself at home. Eating and drinking      Take an oral rehydration solution (ORS). This is a drink that is sold at pharmacies and retail stores.  Drink clear fluids slowly and in small amounts as you are able. Clear fluids include water, ice chips, low-calorie sports drinks, and fruit juice that has water added (diluted fruit juice).  Eat bland, easy-to-digest foods in small amounts as you are able. These foods include bananas, applesauce, rice, lean meats, toast, and crackers.  Avoid fluids that contain a lot of sugar or caffeine, such as energy drinks, sports drinks, and soda.  Avoid alcohol.  Avoid spicy or fatty foods. General instructions  Take over-the-counter and prescription medicines only as told by your health care provider.  Drink enough fluid to keep your urine pale yellow.  Wash your hands often using soap and water. If soap and water are not available, use hand sanitizer.  Make sure that all people in your household wash their hands well and often.  Rest at home while you recover.  Watch your  condition for any changes.  Breathe slowly and deeply when you feel nauseated.  Keep all follow-up visits as told by your health care provider. This is important. Contact a health care provider if:  Your symptoms get worse.  You have new symptoms.  You have a fever.  You cannot drink fluids without vomiting.  Your nausea does not go away after 2 days.  You feel light-headed or dizzy.  You have a headache.  You have muscle cramps.  You have a rash.  You have pain while urinating. Get help right away if:  You have pain in your chest, neck, arm, or jaw.  You feel extremely weak or you faint.  You have persistent vomiting.  You have vomit that is bright red or looks like black coffee grounds.  You have bloody or black stools or stools that look like tar.  You have a severe headache, a stiff neck, or both.  You have severe pain, cramping, or bloating in your abdomen.  You have difficulty breathing, or you are breathing very quickly.  Your heart is beating very quickly.  Your skin feels cold and clammy.  You feel confused.  You have signs of dehydration, such as: ? Dark urine, very little urine, or no urine. ? Cracked lips. ? Dry mouth. ? Sunken eyes. ? Sleepiness. ? Weakness. These symptoms may represent a serious problem that is an emergency. Do not wait to see if the symptoms will go away. Get medical help right away. Call your local emergency services (911  in the [place] Do not drive yourself to the hospital. Summary  Nausea is the feeling that you have an upset stomach or that you are about to vomit. As nausea gets worse, it can lead to vomiting. Vomiting can make you feel weak and cause you to become dehydrated.  Follow instructions from your health care provider about eating and drinking to prevent dehydration.  Take over-the-counter and prescription medicines only as told by your health care provider.  Contact your health care provider if your  symptoms get worse, or you have new symptoms.  Keep all follow-up visits as told by your health care provider. This is important. This information is not intended to replace advice given to you by your health care provider. Make sure you discuss any questions you have with your health care provider. Document Released: 06/24/2005 Document Revised: 10/16/2018 Document Reviewed: 12/02/2017 Elsevier Patient Education  2020 [employer].

## 2019-05-07 NOTE — Plan of Care (Signed)
  Problem: Education: Goal: Knowledge of General Education information will improve Description: Including pain rating scale, medication(s)/side effects and non-pharmacologic comfort measures Outcome: Progressing   Problem: Health Behavior/Discharge Planning: Goal: Ability to manage health-related needs will improve Outcome: Progressing Note: Patient just returned from an upper and lower GI scope. Will resume a regular diet. Will continue to monitor for the remainder of the shift. Wenda Low [facility]

## 2019-05-07 NOTE — Anesthesia Post-op Follow-up Note (Signed)
Anesthesia QCDR form completed.        

## 2019-05-07 NOTE — Progress Notes (Signed)
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

## 2019-05-07 NOTE — Anesthesia Preprocedure Evaluation (Signed)
Anesthesia Evaluation  Patient identified by MRN, date of birth, ID band Patient awake    Reviewed: Allergy & Precautions, NPO status , Patient's Chart, lab work & pertinent test results  History of Anesthesia Complications Negative for: history of anesthetic complications  Airway Mallampati: II  TM Distance: >3 FB Neck ROM: Full    Dental  (+) Poor Dentition   Pulmonary neg sleep apnea, neg COPD,    breath sounds clear to auscultation- rhonchi (-) wheezing      Cardiovascular hypertension, Pt. on medications (-) CAD, (-) Past MI, (-) Cardiac Stents and (-) CABG  Rhythm:Regular Rate:Normal - Systolic murmurs and - Diastolic murmurs Troponin elevated on admission, seen and cleared by cardiology, demand ischemia in setting of tachycardia and hypovolemia   Neuro/Psych neg Seizures Anxiety negative neurological ROS     GI/Hepatic Neg liver ROS, GERD  ,  Endo/Other  negative endocrine ROSneg diabetes  Renal/GU Renal disease: hx of nephrolithiasis.     Musculoskeletal negative musculoskeletal ROS (+)   Abdominal (+) + obese,   Peds  Hematology negative hematology ROS (+)   Anesthesia Other Findings Past Medical History: No date: Anxiety No date: Constipation No date: GERD (gastroesophageal reflux disease) No date: Hypertension No date: Kidney stones No date: Kidney stones   Reproductive/Obstetrics                             Anesthesia Physical Anesthesia Plan  ASA: III  Anesthesia Plan: General   Post-op Pain Management:    Induction: Intravenous  PONV Risk Score and Plan: 1 and Propofol infusion  Airway Management Planned: Natural Airway  Additional Equipment:   Intra-op Plan:   Post-operative Plan:   Informed Consent: I have reviewed the patients History and Physical, chart, labs and discussed the procedure including the risks, benefits and alternatives for the proposed  anesthesia with the patient or authorized representative who has indicated his/her understanding and acceptance.     Dental advisory given  Plan Discussed with: CRNA and Anesthesiologist  Anesthesia Plan Comments:         Anesthesia Quick Evaluation

## 2019-05-07 NOTE — Transfer of Care (Signed)
Immediate Anesthesia Transfer of Care Note  Patient: Stephen Avila  Procedure(s) Performed: ESOPHAGOGASTRODUODENOSCOPY (EGD) (N/A ) COLONOSCOPY WITH PROPOFOL (N/A )  Patient Location: Endoscopy Unit  Anesthesia Type:General  Level of Consciousness: drowsy and patient cooperative  Airway & Oxygen Therapy: Patient Spontanous Breathing and Patient connected to face mask oxygen  Post-op Assessment: Report given to RN and Post -op Vital signs reviewed and stable  Post vital signs: Reviewed and stable  Last Vitals:  Vitals Value Taken Time  BP 90/56 05/07/19 1441  Temp 36.7 C 05/07/19 1439  Pulse 87 05/07/19 1442  Resp 15 05/07/19 1442  SpO2 96 % 05/07/19 1442  Vitals shown include unvalidated device data.  Last Pain:  Vitals:   05/07/19 1439  TempSrc:   PainSc: Asleep      Patients Stated Pain Goal: 2 ([id number])  Complications: No apparent anesthesia complications

## 2019-05-10 ENCOUNTER — Encounter: Payer: Self-pay | Admitting: Gastroenterology

## 2019-05-11 LAB — SURGICAL PATHOLOGY

## 2019-05-13 ENCOUNTER — Telehealth: Payer: Self-pay | Admitting: Gastroenterology

## 2019-05-13 NOTE — Telephone Encounter (Signed)
-----   Message from [name], MD sent at 05/12/2019  8:19 PM EST ----- Regarding: Hospital follow-up Please make a follow-up to see me in 6 to 8 weeks Dx: Follow-up of peptic ulcer disease  ThanksRohini

## 2019-05-13 NOTE — Anesthesia Postprocedure Evaluation (Signed)
Anesthesia Post Note  Patient: Stephen Avila  Procedure(s) Performed: ESOPHAGOGASTRODUODENOSCOPY (EGD) (N/A ) COLONOSCOPY WITH PROPOFOL (N/A )  Patient location during evaluation: Endoscopy Anesthesia Type: General Level of consciousness: awake and alert Pain management: pain level controlled Vital Signs Assessment: post-procedure vital signs reviewed and stable Respiratory status: spontaneous breathing, nonlabored ventilation and respiratory function stable Cardiovascular status: blood pressure returned to baseline and stable Postop Assessment: no apparent nausea or vomiting Anesthetic complications: no     Last Vitals:  Vitals:   05/07/19 1519 05/07/19 1556  BP: (!) 99/59 111/63  Pulse: 88 81  Resp: 16 19  Temp:  36.7 C  SpO2: 99% 98%    Last Pain:  Vitals:   05/07/19 1556  TempSrc: Oral  PainSc:                  Alphonsus Sias

## 2019-05-13 NOTE — Telephone Encounter (Signed)
No vm set up to schedule Follow-up of peptic ulcer disease  With Dr. Marius Ditch

## 2019-05-19 ENCOUNTER — Telehealth: Payer: Self-pay | Admitting: Gastroenterology

## 2019-05-19 NOTE — Telephone Encounter (Signed)
No vm set up to offer apt with Dr. Marius Ditch

## 2019-05-19 NOTE — Telephone Encounter (Signed)
-----   Message from [name], MD sent at 05/12/2019  8:19 PM EST ----- Regarding: Hospital follow-up Please make a follow-up to see me in 6 to 8 weeks Dx: Follow-up of peptic ulcer disease  ThanksRohini

## 2019-05-24 ENCOUNTER — Encounter: Payer: Self-pay | Admitting: Gastroenterology

## 2019-05-24 ENCOUNTER — Telehealth: Payer: Self-pay | Admitting: Gastroenterology

## 2019-05-24 NOTE — Telephone Encounter (Signed)
-----   Message from [name], MD sent at 05/12/2019  8:19 PM EST ----- Regarding: Hospital follow-up Please make a follow-up to see me in 6 to 8 weeks Dx: Follow-up of peptic ulcer disease  ThanksRohini

## 2019-05-24 NOTE — Telephone Encounter (Signed)
No vm to offer apt. Letter sent

## 2019-10-18 DIAGNOSIS — K921 Melena: Secondary | ICD-10-CM | POA: Diagnosis not present

## 2019-10-18 DIAGNOSIS — R001 Bradycardia, unspecified: Secondary | ICD-10-CM | POA: Diagnosis not present

## 2019-10-18 DIAGNOSIS — K5900 Constipation, unspecified: Secondary | ICD-10-CM | POA: Diagnosis not present

## 2019-10-18 DIAGNOSIS — K269 Duodenal ulcer, unspecified as acute or chronic, without hemorrhage or perforation: Secondary | ICD-10-CM | POA: Diagnosis not present

## 2019-10-18 DIAGNOSIS — Z20822 Contact with and (suspected) exposure to covid-19: Secondary | ICD-10-CM | POA: Diagnosis not present

## 2019-10-18 DIAGNOSIS — R000 Tachycardia, unspecified: Secondary | ICD-10-CM | POA: Diagnosis not present

## 2019-10-18 DIAGNOSIS — K264 Chronic or unspecified duodenal ulcer with hemorrhage: Secondary | ICD-10-CM | POA: Diagnosis not present

## 2019-10-18 DIAGNOSIS — F419 Anxiety disorder, unspecified: Secondary | ICD-10-CM | POA: Diagnosis not present

## 2019-10-18 DIAGNOSIS — K3189 Other diseases of stomach and duodenum: Secondary | ICD-10-CM | POA: Diagnosis not present

## 2019-10-18 DIAGNOSIS — R61 Generalized hyperhidrosis: Secondary | ICD-10-CM | POA: Diagnosis not present

## 2019-10-18 DIAGNOSIS — K5710 Diverticulosis of small intestine without perforation or abscess without bleeding: Secondary | ICD-10-CM | POA: Diagnosis not present

## 2019-10-18 DIAGNOSIS — E039 Hypothyroidism, unspecified: Secondary | ICD-10-CM | POA: Diagnosis not present

## 2019-10-18 DIAGNOSIS — D509 Iron deficiency anemia, unspecified: Secondary | ICD-10-CM | POA: Diagnosis not present

## 2019-10-18 DIAGNOSIS — T18128A Food in esophagus causing other injury, initial encounter: Secondary | ICD-10-CM | POA: Diagnosis not present

## 2019-10-18 DIAGNOSIS — K279 Peptic ulcer, site unspecified, unspecified as acute or chronic, without hemorrhage or perforation: Secondary | ICD-10-CM | POA: Diagnosis not present

## 2019-10-18 DIAGNOSIS — E291 Testicular hypofunction: Secondary | ICD-10-CM | POA: Diagnosis not present

## 2019-10-18 DIAGNOSIS — I10 Essential (primary) hypertension: Secondary | ICD-10-CM | POA: Diagnosis not present

## 2019-10-18 DIAGNOSIS — K920 Hematemesis: Secondary | ICD-10-CM | POA: Diagnosis not present

## 2019-10-18 DIAGNOSIS — R112 Nausea with vomiting, unspecified: Secondary | ICD-10-CM | POA: Diagnosis not present

## 2019-10-18 DIAGNOSIS — K439 Ventral hernia without obstruction or gangrene: Secondary | ICD-10-CM | POA: Diagnosis not present

## 2019-10-18 DIAGNOSIS — R0600 Dyspnea, unspecified: Secondary | ICD-10-CM | POA: Diagnosis not present

## 2019-10-18 DIAGNOSIS — R1110 Vomiting, unspecified: Secondary | ICD-10-CM | POA: Diagnosis not present

## 2019-10-29 DIAGNOSIS — Z6831 Body mass index (BMI) 31.0-31.9, adult: Secondary | ICD-10-CM | POA: Diagnosis not present

## 2019-10-29 DIAGNOSIS — R61 Generalized hyperhidrosis: Secondary | ICD-10-CM | POA: Diagnosis not present

## 2019-10-29 DIAGNOSIS — R000 Tachycardia, unspecified: Secondary | ICD-10-CM | POA: Diagnosis not present

## 2019-11-07 ENCOUNTER — Inpatient Hospital Stay (HOSPITAL_COMMUNITY): Payer: BC Managed Care – PPO

## 2019-11-07 ENCOUNTER — Encounter (HOSPITAL_COMMUNITY): Payer: Self-pay | Admitting: Emergency Medicine

## 2019-11-07 ENCOUNTER — Encounter (HOSPITAL_COMMUNITY)
Admission: EM | Disposition: A | Discharge status: Home / Self Care | Payer: Self-pay | Source: Home / Self Care | Attending: Cardiovascular Disease

## 2019-11-07 ENCOUNTER — Inpatient Hospital Stay (HOSPITAL_COMMUNITY)
Admission: EM | Admit: 2019-11-07 | Discharge: 2019-11-08 | DRG: 247 | Disposition: A | Discharge status: Home / Self Care | Payer: BC Managed Care – PPO | Attending: Cardiovascular Disease | Admitting: Cardiovascular Disease

## 2019-11-07 ENCOUNTER — Other Ambulatory Visit: Payer: Self-pay

## 2019-11-07 DIAGNOSIS — F17290 Nicotine dependence, other tobacco product, uncomplicated: Secondary | ICD-10-CM | POA: Diagnosis not present

## 2019-11-07 DIAGNOSIS — I2510 Atherosclerotic heart disease of native coronary artery without angina pectoris: Secondary | ICD-10-CM

## 2019-11-07 DIAGNOSIS — Z79899 Other long term (current) drug therapy: Secondary | ICD-10-CM | POA: Diagnosis not present

## 2019-11-07 DIAGNOSIS — Z825 Family history of asthma and other chronic lower respiratory diseases: Secondary | ICD-10-CM

## 2019-11-07 DIAGNOSIS — Z20822 Contact with and (suspected) exposure to covid-19: Secondary | ICD-10-CM | POA: Diagnosis present

## 2019-11-07 DIAGNOSIS — I213 ST elevation (STEMI) myocardial infarction of unspecified site: Secondary | ICD-10-CM | POA: Diagnosis present

## 2019-11-07 DIAGNOSIS — I252 Old myocardial infarction: Secondary | ICD-10-CM

## 2019-11-07 DIAGNOSIS — K219 Gastro-esophageal reflux disease without esophagitis: Secondary | ICD-10-CM | POA: Diagnosis not present

## 2019-11-07 DIAGNOSIS — K279 Peptic ulcer, site unspecified, unspecified as acute or chronic, without hemorrhage or perforation: Secondary | ICD-10-CM

## 2019-11-07 DIAGNOSIS — Z8249 Family history of ischemic heart disease and other diseases of the circulatory system: Secondary | ICD-10-CM

## 2019-11-07 DIAGNOSIS — F419 Anxiety disorder, unspecified: Secondary | ICD-10-CM | POA: Diagnosis not present

## 2019-11-07 DIAGNOSIS — I2121 ST elevation (STEMI) myocardial infarction involving left circumflex coronary artery: Secondary | ICD-10-CM | POA: Diagnosis present

## 2019-11-07 DIAGNOSIS — E785 Hyperlipidemia, unspecified: Secondary | ICD-10-CM | POA: Diagnosis not present

## 2019-11-07 DIAGNOSIS — I10 Essential (primary) hypertension: Secondary | ICD-10-CM | POA: Diagnosis not present

## 2019-11-07 DIAGNOSIS — Z955 Presence of coronary angioplasty implant and graft: Secondary | ICD-10-CM

## 2019-11-07 DIAGNOSIS — I2119 ST elevation (STEMI) myocardial infarction involving other coronary artery of inferior wall: Principal | ICD-10-CM | POA: Diagnosis present

## 2019-11-07 DIAGNOSIS — Z881 Allergy status to other antibiotic agents status: Secondary | ICD-10-CM

## 2019-11-07 DIAGNOSIS — R0789 Other chest pain: Secondary | ICD-10-CM | POA: Diagnosis not present

## 2019-11-07 DIAGNOSIS — E230 Hypopituitarism: Secondary | ICD-10-CM | POA: Diagnosis present

## 2019-11-07 DIAGNOSIS — R079 Chest pain, unspecified: Secondary | ICD-10-CM | POA: Diagnosis not present

## 2019-11-07 DIAGNOSIS — I499 Cardiac arrhythmia, unspecified: Secondary | ICD-10-CM | POA: Diagnosis not present

## 2019-11-07 HISTORY — PX: CORONARY/GRAFT ACUTE MI REVASCULARIZATION: CATH118305

## 2019-11-07 HISTORY — PX: LEFT HEART CATH AND CORONARY ANGIOGRAPHY: CATH118249

## 2019-11-07 LAB — CBC WITH DIFFERENTIAL/PLATELET
Abs Immature Granulocytes: 0.03 10*3/uL (ref 0.00–0.07)
Basophils Absolute: 0 10*3/uL (ref 0.0–0.1)
Basophils Relative: 1 %
Eosinophils Absolute: 0.5 10*3/uL (ref 0.0–0.5)
Eosinophils Relative: 6 %
HCT: 39.3 % (ref 39.0–52.0)
Hemoglobin: 12.3 g/dL — ABNORMAL LOW (ref 13.0–17.0)
Immature Granulocytes: 0 %
Lymphocytes Relative: 24 %
Lymphs Abs: 1.9 10*3/uL (ref 0.7–4.0)
MCH: 27.3 pg (ref 26.0–34.0)
MCHC: 31.3 g/dL (ref 30.0–36.0)
MCV: 87.3 fL (ref 80.0–100.0)
Monocytes Absolute: 0.7 10*3/uL (ref 0.1–1.0)
Monocytes Relative: 9 %
Neutro Abs: 4.8 10*3/uL (ref 1.7–7.7)
Neutrophils Relative %: 60 %
Platelets: 448 10*3/uL — ABNORMAL HIGH (ref 150–400)
RBC: 4.5 MIL/uL (ref 4.22–5.81)
RDW: 14 % (ref 11.5–15.5)
WBC: 8 10*3/uL (ref 4.0–10.5)
nRBC: 0 % (ref 0.0–0.2)

## 2019-11-07 LAB — BASIC METABOLIC PANEL
Anion gap: 11 (ref 5–15)
BUN: 10 mg/dL (ref 6–20)
CO2: 21 mmol/L — ABNORMAL LOW (ref 22–32)
Calcium: 8.7 mg/dL — ABNORMAL LOW (ref 8.9–10.3)
Chloride: 105 mmol/L (ref 98–111)
Creatinine, Ser: 0.98 mg/dL (ref 0.61–1.24)
GFR calc Af Amer: >60 mL/min (ref >60)
GFR calc non Af Amer: >60 mL/min (ref >60)
Glucose, Bld: 107 mg/dL — ABNORMAL HIGH (ref 70–99)
Potassium: 4.2 mmol/L (ref 3.5–5.1)
Sodium: 137 mmol/L (ref 135–145)

## 2019-11-07 LAB — COMPREHENSIVE METABOLIC PANEL
ALT: 24 U/L (ref 0–44)
AST: 29 U/L (ref 15–41)
Albumin: 4.2 g/dL (ref 3.5–5.0)
Alkaline Phosphatase: 70 U/L (ref 38–126)
Anion gap: 11 (ref 5–15)
BUN: 12 mg/dL (ref 6–20)
CO2: 24 mmol/L (ref 22–32)
Calcium: 9.2 mg/dL (ref 8.9–10.3)
Chloride: 104 mmol/L (ref 98–111)
Creatinine, Ser: 1.12 mg/dL (ref 0.61–1.24)
GFR calc Af Amer: >60 mL/min (ref >60)
GFR calc non Af Amer: >60 mL/min (ref >60)
Glucose, Bld: 137 mg/dL — ABNORMAL HIGH (ref 70–99)
Potassium: 2.8 mmol/L — ABNORMAL LOW (ref 3.5–5.1)
Sodium: 139 mmol/L (ref 135–145)
Total Bilirubin: 0.8 mg/dL (ref 0.3–1.2)
Total Protein: 7.5 g/dL (ref 6.5–8.1)

## 2019-11-07 LAB — TROPONIN I (HIGH SENSITIVITY)
Troponin I (High Sensitivity): 673 ng/L (ref <18)
Troponin I (High Sensitivity): 3686 ng/L (ref <18)
Troponin I (High Sensitivity): 12131 ng/L (ref <18)
Troponin I (High Sensitivity): 14380 ng/L (ref <18)

## 2019-11-07 LAB — CBC
HCT: 36.8 % — ABNORMAL LOW (ref 39.0–52.0)
Hemoglobin: 11.5 g/dL — ABNORMAL LOW (ref 13.0–17.0)
MCH: 26.9 pg (ref 26.0–34.0)
MCHC: 31.3 g/dL (ref 30.0–36.0)
MCV: 86.2 fL (ref 80.0–100.0)
Platelets: 364 10*3/uL (ref 150–400)
RBC: 4.27 MIL/uL (ref 4.22–5.81)
RDW: 13.9 % (ref 11.5–15.5)
WBC: 7.3 10*3/uL (ref 4.0–10.5)
nRBC: 0 % (ref 0.0–0.2)

## 2019-11-07 LAB — POCT I-STAT, CHEM 8
BUN: 13 mg/dL (ref 6–20)
Calcium, Ion: 1.07 mmol/L — ABNORMAL LOW (ref 1.15–1.40)
Chloride: 104 mmol/L (ref 98–111)
Creatinine, Ser: 1 mg/dL (ref 0.61–1.24)
Glucose, Bld: 120 mg/dL — ABNORMAL HIGH (ref 70–99)
HCT: 41 % (ref 39.0–52.0)
Hemoglobin: 13.9 g/dL (ref 13.0–17.0)
Potassium: 3.5 mmol/L (ref 3.5–5.1)
Sodium: 140 mmol/L (ref 135–145)
TCO2: 25 mmol/L (ref 22–32)

## 2019-11-07 LAB — RESPIRATORY PANEL BY RT PCR (FLU A&B, COVID)
Influenza A by PCR: NEGATIVE
Influenza B by PCR: NEGATIVE
SARS Coronavirus 2 by RT PCR: NEGATIVE

## 2019-11-07 LAB — ECHOCARDIOGRAM COMPLETE
Height: 66 in
Weight: 3008.84 oz

## 2019-11-07 LAB — LIPID PANEL
Cholesterol: 177 mg/dL (ref 0–200)
HDL: 39 mg/dL — ABNORMAL LOW (ref >40)
LDL Cholesterol: 104 mg/dL — ABNORMAL HIGH (ref 0–99)
Total CHOL/HDL Ratio: 4.5 RATIO
Triglycerides: 168 mg/dL — ABNORMAL HIGH (ref <150)
VLDL: 34 mg/dL (ref 0–40)

## 2019-11-07 LAB — BRAIN NATRIURETIC PEPTIDE: B Natriuretic Peptide: 18.6 pg/mL (ref 0.0–100.0)

## 2019-11-07 LAB — HEMOGLOBIN A1C
Hgb A1c MFr Bld: 4.6 % — ABNORMAL LOW (ref 4.8–5.6)
Mean Plasma Glucose: 85.32 mg/dL

## 2019-11-07 LAB — APTT: aPTT: 23 seconds — ABNORMAL LOW (ref 24–36)

## 2019-11-07 LAB — MRSA PCR SCREENING: MRSA by PCR: NEGATIVE

## 2019-11-07 LAB — PROTIME-INR
INR: 1.1 (ref 0.8–1.2)
Prothrombin Time: 13.5 seconds (ref 11.4–15.2)

## 2019-11-07 SURGERY — CORONARY/GRAFT ACUTE MI REVASCULARIZATION
Anesthesia: LOCAL

## 2019-11-07 MED ORDER — LIDOCAINE HCL (PF) 1 % IJ SOLN
INTRAMUSCULAR | Status: DC | PRN
  Administered 2019-11-07: 3 mL

## 2019-11-07 MED ORDER — ATORVASTATIN CALCIUM 80 MG PO TABS
80.0000 mg | ORAL_TABLET | Freq: Every day | ORAL | Status: DC
  Administered 2019-11-07 – 2019-11-08 (×2): 80 mg via ORAL
  Filled 2019-11-07 (×2): qty 1

## 2019-11-07 MED ORDER — LOSARTAN POTASSIUM 25 MG PO TABS
25.0000 mg | ORAL_TABLET | Freq: Every day | ORAL | Status: DC
  Administered 2019-11-07 – 2019-11-08 (×2): 25 mg via ORAL
  Filled 2019-11-07 (×2): qty 1

## 2019-11-07 MED ORDER — LIDOCAINE HCL (PF) 1 % IJ SOLN
INTRAMUSCULAR | Status: AC
  Filled 2019-11-07: qty 30

## 2019-11-07 MED ORDER — POTASSIUM CHLORIDE ER 10 MEQ PO TBCR
40.0000 meq | EXTENDED_RELEASE_TABLET | Freq: Once | ORAL | Status: AC
  Administered 2019-11-07: 40 meq via ORAL
  Filled 2019-11-07 (×2): qty 4

## 2019-11-07 MED ORDER — ENOXAPARIN SODIUM 40 MG/0.4ML ~~LOC~~ SOLN
40.0000 mg | SUBCUTANEOUS | Status: DC
  Administered 2019-11-08: 40 mg via SUBCUTANEOUS
  Filled 2019-11-07: qty 0.4

## 2019-11-07 MED ORDER — NITROGLYCERIN 1 MG/10 ML FOR IR/CATH LAB
INTRA_ARTERIAL | Status: AC
  Filled 2019-11-07: qty 10

## 2019-11-07 MED ORDER — NITROGLYCERIN 1 MG/10 ML FOR IR/CATH LAB
INTRA_ARTERIAL | Status: DC | PRN
  Administered 2019-11-07 (×3): 200 ug via INTRACORONARY

## 2019-11-07 MED ORDER — SODIUM CHLORIDE 0.9 % IV SOLN: 250.0000 mL | INTRAVENOUS | Status: DC | PRN

## 2019-11-07 MED ORDER — ASPIRIN 81 MG PO CHEW
CHEWABLE_TABLET | ORAL | Status: AC
  Filled 2019-11-07: qty 3

## 2019-11-07 MED ORDER — SODIUM CHLORIDE 0.9 % IV SOLN
INTRAVENOUS | Status: AC | PRN
  Administered 2019-11-07: 10 mL/h via INTRAVENOUS

## 2019-11-07 MED ORDER — HYDROXYZINE HCL 25 MG PO TABS: 25.0000 mg | ORAL_TABLET | Freq: Four times a day (QID) | ORAL | Status: DC | PRN

## 2019-11-07 MED ORDER — SODIUM CHLORIDE 0.9% FLUSH
3.0000 mL | Freq: Two times a day (BID) | INTRAVENOUS | Status: DC
  Administered 2019-11-07 – 2019-11-08 (×3): 3 mL via INTRAVENOUS

## 2019-11-07 MED ORDER — ASPIRIN 81 MG PO CHEW
CHEWABLE_TABLET | ORAL | Status: DC | PRN
  Administered 2019-11-07: 324 mg via ORAL

## 2019-11-07 MED ORDER — POTASSIUM CHLORIDE 10 MEQ/100ML IV SOLN
10.0000 meq | INTRAVENOUS | Status: AC
  Administered 2019-11-07: 10 meq via INTRAVENOUS
  Filled 2019-11-07: qty 100

## 2019-11-07 MED ORDER — IOHEXOL 350 MG/ML SOLN
INTRAVENOUS | Status: DC | PRN
  Administered 2019-11-07: 170 mL

## 2019-11-07 MED ORDER — TICAGRELOR 90 MG PO TABS
90.0000 mg | ORAL_TABLET | Freq: Two times a day (BID) | ORAL | Status: DC
  Administered 2019-11-07 – 2019-11-08 (×3): 90 mg via ORAL
  Filled 2019-11-07 (×3): qty 1

## 2019-11-07 MED ORDER — ACETAMINOPHEN 325 MG PO TABS: 650.0000 mg | ORAL_TABLET | ORAL | Status: DC | PRN

## 2019-11-07 MED ORDER — HEPARIN (PORCINE) IN NACL 1000-0.9 UT/500ML-% IV SOLN
INTRAVENOUS | Status: DC | PRN
  Administered 2019-11-07 (×2): 500 mL

## 2019-11-07 MED ORDER — ASPIRIN 81 MG PO CHEW
CHEWABLE_TABLET | ORAL | Status: AC
  Filled 2019-11-07: qty 1

## 2019-11-07 MED ORDER — HEPARIN SODIUM (PORCINE) 1000 UNIT/ML IJ SOLN
INTRAMUSCULAR | Status: AC
  Filled 2019-11-07: qty 1

## 2019-11-07 MED ORDER — LABETALOL HCL 5 MG/ML IV SOLN: 10.0000 mg | INTRAVENOUS | Status: AC | PRN

## 2019-11-07 MED ORDER — FENTANYL CITRATE (PF) 100 MCG/2ML IJ SOLN
INTRAMUSCULAR | Status: DC | PRN
  Administered 2019-11-07: 25 ug via INTRAVENOUS

## 2019-11-07 MED ORDER — CARVEDILOL 6.25 MG PO TABS
6.2500 mg | ORAL_TABLET | Freq: Two times a day (BID) | ORAL | Status: DC
  Administered 2019-11-07 – 2019-11-08 (×4): 6.25 mg via ORAL
  Filled 2019-11-07 (×4): qty 1

## 2019-11-07 MED ORDER — LINACLOTIDE 145 MCG PO CAPS
145.0000 ug | ORAL_CAPSULE | Freq: Every day | ORAL | Status: DC
  Administered 2019-11-07 – 2019-11-08 (×2): 145 ug via ORAL
  Filled 2019-11-07 (×2): qty 1

## 2019-11-07 MED ORDER — MIDAZOLAM HCL 2 MG/2ML IJ SOLN
INTRAMUSCULAR | Status: AC
  Filled 2019-11-07: qty 2

## 2019-11-07 MED ORDER — POLYETHYLENE GLYCOL 3350 17 G PO PACK
17.0000 g | PACK | Freq: Every day | ORAL | Status: DC
  Filled 2019-11-07 (×2): qty 1

## 2019-11-07 MED ORDER — IOHEXOL 350 MG/ML SOLN
INTRAVENOUS | Status: AC
  Filled 2019-11-07: qty 1

## 2019-11-07 MED ORDER — TICAGRELOR 90 MG PO TABS
ORAL_TABLET | ORAL | Status: DC | PRN
  Administered 2019-11-07: 180 mg via ORAL

## 2019-11-07 MED ORDER — SODIUM CHLORIDE 0.9 % IV SOLN: INTRAVENOUS | Status: AC

## 2019-11-07 MED ORDER — NITROGLYCERIN 0.4 MG SL SUBL: 0.4000 mg | SUBLINGUAL_TABLET | SUBLINGUAL | Status: DC | PRN

## 2019-11-07 MED ORDER — BUPRENORPHINE HCL 8 MG SL SUBL
20.0000 mg | SUBLINGUAL_TABLET | Freq: Every day | SUBLINGUAL | Status: DC
  Administered 2019-11-07 – 2019-11-08 (×2): 20 mg via SUBLINGUAL
  Filled 2019-11-07 (×2): qty 3

## 2019-11-07 MED ORDER — HEPARIN (PORCINE) IN NACL 1000-0.9 UT/500ML-% IV SOLN
INTRAVENOUS | Status: AC
  Filled 2019-11-07: qty 1000

## 2019-11-07 MED ORDER — TICAGRELOR 90 MG PO TABS
ORAL_TABLET | ORAL | Status: AC
  Filled 2019-11-07: qty 2

## 2019-11-07 MED ORDER — ASPIRIN 81 MG PO CHEW
81.0000 mg | CHEWABLE_TABLET | Freq: Every day | ORAL | Status: DC
  Administered 2019-11-07 – 2019-11-08 (×2): 81 mg via ORAL
  Filled 2019-11-07 (×2): qty 1

## 2019-11-07 MED ORDER — DIAZEPAM 5 MG PO TABS: 5.0000 mg | ORAL_TABLET | Freq: Four times a day (QID) | ORAL | Status: DC | PRN

## 2019-11-07 MED ORDER — FENTANYL CITRATE (PF) 100 MCG/2ML IJ SOLN
INTRAMUSCULAR | Status: AC
  Filled 2019-11-07: qty 2

## 2019-11-07 MED ORDER — VERAPAMIL HCL 2.5 MG/ML IV SOLN
INTRAVENOUS | Status: AC
  Filled 2019-11-07: qty 2

## 2019-11-07 MED ORDER — HEPARIN SODIUM (PORCINE) 1000 UNIT/ML IJ SOLN
INTRAMUSCULAR | Status: DC | PRN
  Administered 2019-11-07 (×2): 5000 [IU] via INTRAVENOUS
  Administered 2019-11-07: 4000 [IU] via INTRAVENOUS
  Administered 2019-11-07 (×2): 2000 [IU] via INTRAVENOUS

## 2019-11-07 MED ORDER — CHLORHEXIDINE GLUCONATE CLOTH 2 % EX PADS
6.0000 | MEDICATED_PAD | Freq: Every day | CUTANEOUS | Status: DC
  Administered 2019-11-07 – 2019-11-08 (×2): 6 via TOPICAL

## 2019-11-07 MED ORDER — PANTOPRAZOLE SODIUM 40 MG PO TBEC
40.0000 mg | DELAYED_RELEASE_TABLET | Freq: Two times a day (BID) | ORAL | Status: DC
  Administered 2019-11-07 – 2019-11-08 (×3): 40 mg via ORAL
  Filled 2019-11-07 (×3): qty 1

## 2019-11-07 MED ORDER — SODIUM CHLORIDE 0.9% FLUSH: 3.0000 mL | INTRAVENOUS | Status: DC | PRN

## 2019-11-07 MED ORDER — ONDANSETRON HCL 4 MG/2ML IJ SOLN: 4.0000 mg | Freq: Four times a day (QID) | INTRAMUSCULAR | Status: DC | PRN

## 2019-11-07 MED ORDER — VERAPAMIL HCL 2.5 MG/ML IV SOLN
INTRAVENOUS | Status: DC | PRN
  Administered 2019-11-07: 10 mL via INTRA_ARTERIAL

## 2019-11-07 MED ORDER — HYDRALAZINE HCL 20 MG/ML IJ SOLN: 10.0000 mg | INTRAMUSCULAR | Status: AC | PRN

## 2019-11-07 MED ORDER — MIDAZOLAM HCL 2 MG/2ML IJ SOLN
INTRAMUSCULAR | Status: DC | PRN
  Administered 2019-11-07: 2 mg via INTRAVENOUS

## 2019-11-07 SURGICAL SUPPLY — 18 items
BALLN SAPPHIRE 2.5X12 (BALLOONS) ×2
BALLN SAPPHIRE ~~LOC~~ 3.0X15 (BALLOONS) ×2 IMPLANT
BALLOON SAPPHIRE 2.5X12 (BALLOONS) ×1 IMPLANT
CATH INFINITI JR4 5F (CATHETERS) ×2 IMPLANT
CATH OPTITORQUE TIG 4.0 5F (CATHETERS) ×2 IMPLANT
CATH VISTA GUIDE 6FR XB3.5 (CATHETERS) ×2 IMPLANT
DEVICE RAD COMP TR BAND LRG (VASCULAR PRODUCTS) ×2 IMPLANT
GLIDESHEATH SLEND SS 6F .021 (SHEATH) ×2 IMPLANT
GUIDEWIRE INQWIRE 1.5J.035X260 (WIRE) ×1 IMPLANT
INQWIRE 1.5J .035X260CM (WIRE) ×2
KIT ENCORE 26 ADVANTAGE (KITS) ×2 IMPLANT
KIT HEART LEFT (KITS) ×2 IMPLANT
PACK CARDIAC CATHETERIZATION (CUSTOM PROCEDURE TRAY) ×2 IMPLANT
STENT RESOLUTE ONYX 3.0X22 (Permanent Stent) ×2 IMPLANT
TRANSDUCER W/STOPCOCK (MISCELLANEOUS) ×2 IMPLANT
TUBING CIL FLEX 10 FLL-RA (TUBING) ×2 IMPLANT
WIRE COUGAR XT STRL 190CM (WIRE) ×2 IMPLANT
WIRE HI TORQ VERSACORE-J 145CM (WIRE) ×2 IMPLANT

## 2019-11-07 NOTE — ED Notes (Signed)
Transported to cath lab 

## 2019-11-07 NOTE — Progress Notes (Signed)
  Echocardiogram 2D Echocardiogram has been performed.  Stephen Avila 11/07/2019, 2:29 PM

## 2019-11-07 NOTE — Plan of Care (Signed)
  Problem: Clinical Measurements: Goal: Cardiovascular complication will be avoided Outcome: Progressing   Problem: Pain Managment: Goal: General experience of comfort will improve Outcome: Progressing   Problem: Safety: Goal: Ability to remain free from injury will improve Outcome: Progressing   Problem: Cardiovascular: Goal: Ability to achieve and maintain adequate cardiovascular perfusion will improve Outcome: Progressing

## 2019-11-07 NOTE — ED Triage Notes (Signed)
Patient arrived with EMS from home as a Code STEMI , received 2 NTG sl prior to arrival , chest pain this evening radiating to left jaw and bilateral arms , evaluated by EDP and cardiologist at arrival .

## 2019-11-07 NOTE — Plan of Care (Signed)

## 2019-11-07 NOTE — ED Provider Notes (Signed)
Emergency Department Provider Note  I have reviewed the triage vital signs and the nursing notes.  HISTORY  Chief Complaint Code STEMI   HPI Stephen Avila is a 44 y.o. male with medical problems documented below who presents the emerge department as a code STEMI secondary to chest pain.  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  Tonight patient started having acute onset of significant chest pain with mild shortness of breath and nausea.  EMS was called was seen to have ST elevations in multiple leads including posterior and right-sided leads.   No history of the same.  Was recently hospitalized for GI bleed.  No blood thinners.  Aspirin was not given prior to arrival secondary to the recent GI bleed.  Nitro was given with some drop in his pressures.  No other associated or modifying symptoms.   Level V Caveat secondary to acuity of condition and need for emergent intervention.   Past Medical History:  Diagnosis Date  . Anxiety   . Constipation   . GERD (gastroesophageal reflux disease)   . Hypertension   . Kidney stones   . Kidney stones     Patient Active Problem List   Diagnosis Date Noted  . Upper GI bleed 05/05/2019  . Shortness of breath   . Abnormal cardiac enzyme level   . 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 Diaphoresis 08/31/2015  . Hyperthyroidism 08/30/2015  . 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 Kidney stone 12/09/2013  . Unspecified constipation 08/20/2013    Past Surgical History:  Procedure Laterality Date  . CHOLECYSTECTOMY N/A 06/26/2015   Procedure: LAPAROSCOPIC CHOLECYSTECTOMY;  Surgeon: [name], MD;  Location: [facility];  Service: General;  Laterality: N/A;  . COLONOSCOPY WITH PROPOFOL N/A 05/07/2019   Procedure: COLONOSCOPY WITH PROPOFOL;  Surgeon: [name], MD;  [facility]  [facility] ENDOSCOPY;  Service: Gastroenterology;  Laterality: N/A;  . ESOPHAGOGASTRODUODENOSCOPY N/A 05/07/2019   Procedure: ESOPHAGOGASTRODUODENOSCOPY (EGD);  Surgeon: [name], MD;  Location: [facility] ENDOSCOPY;  Service: Gastroenterology;  Laterality: N/A;  . HERNIA REPAIR     x 2   . LITHOTRIPSY      Current Outpatient Rx  . Order #: [id number] Class: OTC  . Order #: [id number] Class: Historical Med  . Order #: [id number] Class: Print  . Order #: [id number] Class: Print  . Order #: [id number] Class: Print  . Order #: [id number] Class: Print  . Order #: [id number] Class: Historical Med  . Order #: [id number] Class: Print    Allergies Patient has no known allergies.  Family History  Problem Relation Age of Onset  . COPD Mother   . Heart failure Father   . Thyroid disease Neg Hx     Social History Social History   Tobacco Use  . Smoking status: Never Smoker  . Smokeless tobacco: Current User    Types: Snuff  Substance Use Topics  . Alcohol use: No  . Drug use: No    Review of Systems  All other systems negative except as documented in the HPI. All pertinent positives and negatives as reviewed in the HPI. ____________________________________________  PHYSICAL EXAM:  VITAL SIGNS: Vitals:   11/07/19 0256  Weight: 100 kg  Height: 5\' 6"  (1.676 m)     Constitutional: Alert and oriented. Well appearing and in no acute distress. Eyes: Conjunctivae are normal. PERRL. EOMI. Head: Atraumatic. Nose: No congestion/rhinnorhea.  Mouth/Throat: Mucous membranes are moist.  Oropharynx non-erythematous. Neck: No stridor.  No meningeal signs.   Cardiovascular: Normal rate, regular rhythm. Good peripheral circulation. Grossly normal heart sounds.   Respiratory: Normal respiratory effort.  No retractions. Lungs CTAB. Gastrointestinal: Soft and nontender. No distention.  Musculoskeletal: No lower extremity tenderness nor edema. No gross deformities of extremities. Neurologic:  Normal  speech and language. No gross focal neurologic deficits are appreciated.  Skin:  Skin is warm, dry and intact. No rash noted.  ____________________________________________   LABS (all labs ordered are listed, but only abnormal results are displayed)  Labs Reviewed  RESPIRATORY PANEL BY RT PCR (FLU A&B, COVID)  I-STAT CHEM 8, ED   ____________________________________________  EKG   EKG Interpretation  Date/Time:  Sunday Nov 07 2019 02:57:47 EDT Ventricular Rate:  81 PR Interval:    QRS Duration: 112 QT Interval:  378 QTC Calculation: 439 R Axis:   65 Text Interpretation: Sinus rhythm Inferior infarct, acute (LCx) ST elevation, consider anterior injury Lateral leads are also involved >>> Acute MI <<< Confirmed by Merrily Pew [phone]) on 11/07/2019 7:59:36 AM       ____________________________________________  RADIOLOGY  No results found. ____________________________________________  PROCEDURES  Procedure(s) performed:   .Critical Care Performed by: Merrily Pew, MD Authorized by: Merrily Pew, MD   Critical care provider statement:    Critical care time (minutes):  45   Critical care was necessary to treat or prevent imminent or life-threatening deterioration of the following conditions:  Cardiac failure   Critical care was time spent personally by me on the following activities:  Discussions with consultants, evaluation of patient's response to treatment, examination of patient, ordering and performing treatments and interventions, ordering and review of laboratory studies, ordering and review of radiographic studies, pulse oximetry, re-evaluation of patient's condition, obtaining history from patient or surrogate, review of old charts and development of treatment plan with patient or surrogate   ____________________________________________  INITIAL IMPRESSION / ASSESSMENT AND PLAN / ED COURSE   This patient presents to the ED for concern of chest pain, this  involves an extensive number of treatment options, and is a complaint that carries with it a high risk of complications and morbidity.  The differential diagnosis includes ST elevation myocardial infarction.   Lab Tests:   I Ordered, reviewed, and interpreted labs, which included CBC BMP and troponin which resulted after the patient went to the Cath Lab  Medicines ordered:   I ordered medication none  Imaging Studies ordered:   I ordered imaging studies which included none  Additional history obtained:   Additional history obtained from EMS  Previous records obtained and reviewed none  Consultations Obtained:   I consulted cardiology and discussed lab and imaging findings  Reevaluation:  After the interventions stated above, I reevaluated the patient and found patient with ST elevation myocardial infarction requiring emergent transportation to the catheterization lab for PCI.  ____________________________________________  FINAL CLINICAL IMPRESSION(S) / ED DIAGNOSES  Final diagnoses:  ST elevation myocardial infarction (STEMI), unspecified artery ([place])    MEDICATIONS GIVEN DURING THIS VISIT:  Medications - No data to display  NEW OUTPATIENT MEDICATIONS STARTED DURING THIS VISIT:  New Prescriptions   No medications on file    Note:  This note was prepared with assistance of Dragon voice recognition software. Occasional wrong-word or sound-a-like substitutions may have occurred due to the inherent limitations of voice recognition software.   Shanetra Blumenstock, Corene Cornea, MD 11/08/19 [phone]

## 2019-11-07 NOTE — Progress Notes (Addendum)
Progress Note  Patient Name: Stephen Avila Date of Encounter: 11/07/2019  Primary Cardiologist: No primary care provider on file.    Patient Profile:   Stephen Avila is a 44 y.o. male with HTN, peptic ulcer disease w recurrent ( most recently 4/21) hospitalization for anemia, hypogonadotropic hypogonadism, long-term buprenorphine use, and family history of heart disease admitted with STEMI.    Cath >> OM2 occlusion >>DES   [place]-70%  Nondominant R 60-70  Subjective   No chest pain.  No shortness of breath.  Inpatient Medications    Scheduled Meds: . aspirin  81 mg Oral Daily  . atorvastatin  80 mg Oral Daily  . buprenorphine  20 mg Sublingual Daily  . carvedilol  6.25 mg Oral BID WC  . Chlorhexidine Gluconate Cloth  6 each Topical Daily  . [START ON 11/08/2019] enoxaparin (LOVENOX) injection  40 mg Subcutaneous Q24H  . linaclotide  145 mcg Oral Daily  . pantoprazole  40 mg Oral BID  . polyethylene glycol  17 g Oral Daily  . sodium chloride flush  3 mL Intravenous Q12H  . ticagrelor  90 mg Oral BID   Continuous Infusions: . sodium chloride 125 mL/hr at 11/07/19 0800  . sodium chloride    . sodium chloride    . potassium chloride 100 mL/hr at 11/07/19 0707   PRN Meds: sodium chloride, acetaminophen, diazepam, hydrALAZINE, hydrOXYzine, labetalol, nitroGLYCERIN, ondansetron (ZOFRAN) IV, sodium chloride flush   Vital Signs    Vitals:   11/07/19 0700 11/07/19 0715 11/07/19 0752 11/07/19 0812  BP: 136/85 125/89  138/83  Pulse: 86 70  73  Resp: 13 17  12   Temp:      TempSrc:   Oral   SpO2: 99% 99%  99%  Weight:      Height:        Intake/Output Summary (Last 24 hours) at 11/07/2019 0842 Last data filed at 11/07/2019 0800 Gross per 24 hour  Intake 538.98 ml  Output --  Net 538.98 ml   Last 3 Weights 11/07/2019 11/07/2019 05/07/2019  Weight (lbs) 188 lb 0.8 oz 220 lb 7.4 oz 195 lb 1.6 oz  Weight (kg) 85.3 kg 100 kg 88.497 kg      Telemetry    Sinus with  rates exceeding 100- Personally Reviewed  ECG      - Personally Reviewed  Physical Exam   GEN: No acute distress.   Neck: No JVD Cardiac: RRR, no murmurs, rubs, or gallops.  Respiratory: Clear to auscultation bilaterally. GI: Soft, nontender, non-distended  MS: No edema; No deformity. Neuro:  Nonfocal  Psych: Normal affect   Labs    High Sensitivity Troponin:   Recent Labs  Lab 11/07/19 0304 11/07/19 0635  TROPONINIHS 673* 3,686*      Chemistry Recent Labs  Lab 11/07/19 0302 11/07/19 0304 11/07/19 0635  NA 140 139 137  K 3.5 2.8* 4.2  CL 104 104 105  CO2  --  24 21*  GLUCOSE 120* 137* 107*  BUN 13 12 10   CREATININE 1.00 1.12 0.98  CALCIUM  --  9.2 8.7*  PROT  --  7.5  --   ALBUMIN  --  4.2  --   AST  --  29  --   ALT  --  24  --   ALKPHOS  --  70  --   BILITOT  --  0.8  --   GFRNONAA  --  >60 >60  GFRAA  --  >60 >  [place] 11     Hematology Recent Labs  Lab 11/07/19 0302 11/07/19 0304 11/07/19 0635  WBC  --  8.0 7.3  RBC  --  4.50 4.27  HGB 13.9 12.3* 11.5*  HCT 41.0 39.3 36.8*  MCV  --  87.3 86.2  MCH  --  27.3 26.9  MCHC  --  31.3 31.3  RDW  --  14.0 13.9  PLT  --  448* 364    BNP Recent Labs  Lab 11/07/19 0635  BNP 18.6     DDimer No results for input(s): DDIMER in the last 168 hours.   Radiology    CARDIAC CATHETERIZATION  Result Date: 11/07/2019  Prox RCA lesion is 70% stenosed.  1st Mrg lesion is 70% stenosed.  2nd Mrg lesion is 100% stenosed.  Acute ST segment elevation myocardial infarction secondary to total occlusion of a large OM 2 vessel in a left dominant circulation. The left main is very short and immediately bifurcates into a normal large LAD vessel without significant obstruction; circumflex vessel gives rise to a high marginal OM1 which has a ramus distribution and has diffuse 70% proximal narrowing, the OM 2 vessel is totally occluded with TIMI 0 flow and the distal circumflex supplies the inferolateral  and posterior lateral wall; small nondominant RCA with diffuse 60-70% proximal to mid irregularity/stenosis. Preserved global LV contractility with EF estimated at 55% with possible subtle focal inferior hypocontractility. Successful PCI of the OM 2 vessel with PTCA and ultimate stenting with a 3.0 x 22 mm Resolute DES stent dilated to 3.1 mm with 100% occlusion being reduced to 0% and resumption of brisk TIMI-3 flow. RECOMMENDATION: DAPT for minimum of 12 months.  Initial adequate therapy for concomitant CAD of his large high marginal branch and small RCA vessel.  Initiate atorvastatin 80 mg for aggressive lipid-lowering therapy and attempt to induce plaque regression with target LDL less than 70 and preferably in the fifties or below.  Optimal blood pressure control.    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 we will also begin him on low-dose losartan-25  Await echocardiogram  There are data relating testosterone replacement with increased risk of MI, particularly in older men;  What I could find is that when used in pts w hypogonadotropic hypogonadism increased risk seems to be mediated via polycythemia which is absent in him       For questions or updates, please contact [facility] Please consult [email or web address] for contact info under        Signed, Virl Axe, MD  11/07/2019, 8:42 AM

## 2019-11-07 NOTE — H&P (Addendum)
Cardiology Admission History and Physical:   Patient ID: Stephen Avila MRN: [id number]; DOB: 05/11/1976   Admission date: 11/07/2019  Primary Care Provider: Patient, No Pcp Per Primary Cardiologist: No primary care provider on file.  Primary Electrophysiologist:  None   Chief Complaint:  Chest pain  Patient Profile:   Stephen Avila is a 44 y.o. male with HTN, peptic ulcer disease, hypogonadotropic hypogonadism, long-term buprenorphine use, and family history of heart disease admitted with STEMI.  History of Present Illness:   Stephen Avila was sitting on his couch two hours prior to arrival in the ED when he developed sudden onset of crushing substernal chest pain, radiating to both arms and left side of jaw, associated with diaphoresis. On EMS arrival, BP was 174/80, HR 80s. ECG showed inferolateral ST elevations. Posterior ECG demonstrated ST elevations in V7-V9 as well. He received sublingual nitroglycerin tablet. On arrival to ED, BP was 112/60 with HR 80s. Serial ECG demonstrated increasing amplitude of ST elevations. He was still having CP on arrival. He was taken emergently to the cath lab with Dr. Claiborne Billings. He was found to have acute occlusion of large OM2 and underwent successful PCI with Onyx 3-0 x 22 mm DES.    Stephen Avila reports a family history of heart disease - both parents were diagnosed in their 64s and have now passed away. He has a personal history of hypertension (on amlodipine and carvedilol) but no known HLD or diabetes.   He has known peptic ulcer disease, first diagnosed in 04/2019 when he presented to [place] with melena and hemoglobin drop to 8. He had been taking Goody powder / BC powder 5-6 times a day prior to that presentation. 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 Biopsy was negative for H.pylori. Colonoscopy had poor prep but no notable  lesions. He was started on omeprazole and iron supplements. He was then admitted to [facility] 4/12 with tachycardia and coffee ground emesis. EGD showed localized erosions with no active stigmata of bleeding in the prepyloric region of the stomach along with 1 nonbleeding cratered duodenal ulcer with clean base and 2 nonbleeding diverticula versus fistulous openings. CT abdomen pelvis with contrast and oral contrast was negative for enteroenteric fistula. Hemoglobin nadir was 9.8 this admission. He was discharged on pantoprazole BID. He was also evaluated during this admission for subclinical hypothyroidism, central hypogonadism, and hypercortisolism. Pituitary imaging was normal, IGF-1 (sent to evaluated for acromegaly) was within normal limits, so endocrine abnormalities were attributed to long-term opiate use.   Past Medical History:  Diagnosis Date  . Anxiety   . Constipation   . GERD (gastroesophageal reflux disease)   . Hypertension   . Kidney stones   . Kidney stones     Past Surgical History:  Procedure Laterality Date  . CHOLECYSTECTOMY N/A 06/26/2015   Procedure: LAPAROSCOPIC CHOLECYSTECTOMY;  Surgeon: [name], MD;  Location: [facility]S;  Service: General;  Laterality: N/A;  . COLONOSCOPY WITH PROPOFOL N/A 05/07/2019   Procedure: COLONOSCOPY WITH PROPOFOL;  Surgeon: [name], MD;  Location: [facility] ENDOSCOPY;  Service: Gastroenterology;  Laterality: N/A;  . ESOPHAGOGASTRODUODENOSCOPY N/A 05/07/2019   Procedure: ESOPHAGOGASTRODUODENOSCOPY (EGD);  Surgeon: [name], MD;  Location: [facility] ENDOSCOPY;  Service: Gastroenterology;  Laterality: N/A;  . HERNIA REPAIR     x 2   . LITHOTRIPSY       Medications Prior to Admission: Prior to Admission medications  Medication Sig Start Date End Date Taking? Authorizing Provider  amLODipine (NORVASC) 5 MG tablet Take 5 mg by mouth daily. 10/29/19 10/28/20 Yes [provider]  carvedilol (COREG CR) 10 MG 24 hr capsule Take  10 mg by mouth daily. 10/29/19 10/28/20 Yes [provider]  hydrOXYzine (ATARAX/VISTARIL) 25 MG tablet Take 25 mg by mouth every 6 (six) hours as needed. 10/20/19  Yes [provider]  linaclotide (LINZESS) 145 MCG CAPS capsule Take 145 mcg by mouth daily. 01/26/14  Yes [provider]  pantoprazole (PROTONIX) 40 MG tablet Take 40 mg by mouth in the morning and at bedtime. 10/20/19  Yes [provider]  acetaminophen (TYLENOL) 325 MG tablet Take 2 tablets (650 mg total) by mouth every 6 (six) hours as needed for mild pain (or Fever >/= 101). 05/07/19   Gouru, Illene Silver, MD  bisacodyl (DULCOLAX) 5 MG EC tablet Take 10 mg by mouth daily as needed.    [provider]  buprenorphine (SUBUTEX) 8 MG SUBL SL tablet Place 20 mg under the tongue daily.  09/22/18   [provider]  docusate sodium (COLACE) 100 MG capsule Take 1 capsule (100 mg total) by mouth 2 (two) times daily as needed for mild constipation. 05/07/19   Gouru, Illene Silver, MD  ferrous sulfate 325 (65 FE) MG EC tablet Take 1 tablet (325 mg total) by mouth 2 (two) times daily. 05/07/19 05/06/20  Gouru, Illene Silver, MD  fluticasone (FLONASE) 50 MCG/ACT nasal spray Place 2 sprays into both nostrils daily. 05/07/19   Gouru, Illene Silver, MD  polyethylene glycol powder (GLYCOLAX/MIRALAX) 17 GM/SCOOP powder Take 17 g by mouth daily.    [provider]  testosterone cypionate (DEPOTESTOSTERONE CYPIONATE) 200 MG/ML injection INJECT 1 ML EVERY 2 WEEKS 10/15/18   [provider]  vitamin B-12 1000 MCG tablet Take 1 tablet (1,000 mcg total) by mouth daily. 05/08/19   Nicholes Mango, MD    Allergies:   No Known Allergies  Social History:   Social History   Socioeconomic History  . Marital status: Divorced    Spouse name: Not on file  . Number of children: Not on file  . Years of education: Not on file  . Highest education level: Not on file  Occupational History  . Not on file  Tobacco Use  . Smoking  status: Never Smoker  . Smokeless tobacco: Current User    Types: Snuff  Substance and Sexual Activity  . Alcohol use: No  . Drug use: No  . Sexual activity: Not on file  Other Topics Concern  . Not on file  Social History Narrative  . Not on file   Social Determinants of Health   Financial Resource Strain: Low Risk   . Difficulty of Paying Living Expenses: Not hard at all  Food Insecurity: No Food Insecurity  . Worried About Charity fundraiser in the Last Year: Never true  . Ran Out of Food in the Last Year: Never true  Transportation Needs: No Transportation Needs  . Lack of Transportation (Medical): No  . Lack of Transportation (Non-Medical): No  Physical Activity: Sufficiently Active  . Days of Exercise per Week: 6 days  . Minutes of Exercise per Session: 150+ min  Stress: Stress Concern Present  . Feeling of Stress : Very much  Social Connections: Somewhat Isolated  . Frequency of Communication with Friends and Family: More than three times a week  . Frequency of Social Gatherings with Friends and Family: Once a week  .  Attends Religious Services: 1 to 4 times per year  . Active Member of Clubs or Organizations: No  . Attends Archivist Meetings: Never  . Marital Status: Divorced  Human resources officer Violence: Not At Risk  . Fear of Current or Ex-Partner: No  . Emotionally Abused: No  . Physically Abused: No  . Sexually Abused: No    Family History:   Both parents had heart disease (unclear what variety). Both were diagnosed in their 52s and have since passed away.  The patient's family history includes COPD in his mother; Heart failure in his father. There is no history of Thyroid disease.    ROS:  Please see the history of present illness. All other ROS reviewed and negative.     Physical Exam/Data:   Vitals:   11/07/19 0256 11/07/19 0320  SpO2:  100%  Weight: 100 kg   Height: 5\' 6"  (1.676 m)    No intake or output data in the 24 hours ending  11/07/19 0414 Last 3 Weights 11/07/2019 05/07/2019 05/06/2019  Weight (lbs) 220 lb 7.4 oz 195 lb 1.6 oz 195 lb 1.6 oz  Weight (kg) 100 kg 88.497 kg 88.497 kg     Body mass index is 35.58 kg/m.  General:  Acutely uncomfortable, sitting on stretcher HEENT: normal Lymph: no adenopathy Neck: no JVD Endocrine:  No thryomegaly Vascular: No carotid bruits; FA pulses 2+ bilaterally without bruits  Cardiac:  normal S1, S2; RRR; no murmur  Lungs:  Tachypneic, trace bibasilar crackles  Abd: soft, nontender, no hepatomegaly  Ext: no edema Musculoskeletal:  No deformities, BUE and BLE strength normal and equal Skin: diaphoretic  Neuro:  no focal abnormalities noted, answering questions appropriately in full sentences  Psych:  Normal affect   EKG:  The ECG that was done 11/07/2019 was personally reviewed and demonstrates sinus arrhythmia with up to 56mm ST elevations in II, III, aVF, 2mm ST elevations in V5, V6, and reciprocal depressions in aVL.   Relevant CV Studies: Echo pending   Laboratory Data:  High Sensitivity Troponin:  No results for input(s): TROPONINIHS in the last 720 hours.    Chemistry Recent Labs  Lab 11/07/19 0302  NA 140  K 3.5  CL 104  GLUCOSE 120*  BUN 13  CREATININE 1.00    No results for input(s): PROT, ALBUMIN, AST, ALT, ALKPHOS, BILITOT in the last 168 hours. Hematology Recent Labs  Lab 11/07/19 0302 11/07/19 0304  WBC  --  8.0  RBC  --  4.50  HGB 13.9 12.3*  HCT 41.0 39.3  MCV  --  87.3  MCH  --  27.3  MCHC  --  31.3  RDW  --  14.0  PLT  --  448*   BNPNo results for input(s): BNP, PROBNP in the last 168 hours.  DDimer No results for input(s): DDIMER in the last 168 hours.  Radiology/Studies:  No results found.  TIMI Risk Score for ST  Elevation MI:   The patient's TIMI risk score is 3, which indicates a 4.4% risk of all cause mortality at 30 days.    Assessment and Plan:   1. STEMI, s/p PCI to OM2 - ASA 325 load in cath lab, start 81 mg daily  tomorrow - Received ticagrelor 180 mg, follow with 90 mg BID; continue DAPT therapy for 12 months - Start atorvastatin 40 mg daily  - BB: can resume carvedilol in the morning, use short acting and uptitrate - ACEi: start today if BP allows -  Echo in AM  - Hemoglobin A1c and lipid panel in process - Refer for cardiac rehab  2. HTN - continue carvedilol 10 mg daily - favor transition from amlodipine to ACE inhibitor for benefit in CAD  3. Peptic ulcer disease - continue BID protonix - ensure that he stays off NSAIDs given elevated risk with DAPT   4. Buprenorphic dependence - continue home dose 20mg  daily   5. Central hypopituitarism - on testosterone supplement as outpatient - repeat TFT with PCP as outpatient per last endocrine consult at [facility]   Severity of Illness: The appropriate patient status for this patient is INPATIENT. Inpatient status is judged to be reasonable and necessary in order to provide the required intensity of service to ensure the patient's safety. The patient's presenting symptoms, physical exam findings, and initial radiographic and laboratory data in the context of their chronic comorbidities is felt to place them at high risk for further clinical deterioration. Furthermore, it is not anticipated that the patient will be medically stable for discharge from the hospital within 2 midnights of admission. The following factors support the patient status of inpatient.   " The patient's presenting symptoms include chest pain, jaw pain. " The worrisome physical exam findings include diaphoresis, tachypnea. " The initial radiographic and laboratory data are worrisome because of ST elevation MI " The chronic co-morbidities include hypertension  * I certify that at the point of admission it is my clinical judgment that the patient will require inpatient hospital care spanning beyond 2 midnights from the point of admission due to high intensity of service, high risk for  further deterioration and high frequency of surveillance required.*   For questions or updates, please contact [place] Please consult [email or web address] for contact info under   Signed, Osvaldo Shipper, MD  11/07/2019 4:14 AM   Patient seen and examined. Agree with assessment and plan.  Stephen Avila is a 25 year old gentleman who works as a Forensic scientist for [employer].  He has a history of hypertension, peptic ulcer disease with initial diagnosis in October 2020 and her recent GI bleed [place] 3 weeks ago.  He has family history for CAD.  He developed new onset chest pain around 1 AM this morning.  His chest pain persisted and ultimately called EMS where his ECG showed inferolateral ST elevation.  Code STEMI was activated.  Upon arrival to the emergency room, ECG showed sinus rhythm 1 with 3 mm of ST elevation in leads II 3 and F in 1 to 2 mm in V4 to V6.  He was still having chest pain upon arrival and was taken acutely to the catheterization laboratory for emergent catheterization and intervention.  Emergent catheterization revealed totally occluded large OM 2 branch of a dominant left circumflex vessel which was successfully intervened upon with ultimate insertion of a 3 oh x22 mm Resolute Onyx stent.  He has concomitant CAD with diffuse 70% stenosis in the high marginal/ramus-like vessel and in addition has 60 to 70% stenosis in a small nondominant RCA.  Plan initial medical management for his ramus and RCA disease.  He will need DAPT for minimum of 12 months.  Will change from Coreg CR 10 mg to initally to coreg 6.25 mg bid, add ACE-I/ARB post MI and later resumed amlodipine for additional anti-ischemic benefit as blood pressure allows.  A 2D echo Doppler study will be ordered.  With recent peptic ulcer disease and GI bleed, continue Protonix 40 mg twice daily, and avoid nonsteroidal anti-inflammatory medication or  Goody powders.   [name], MD, [facility] 11/07/2019 5:05 AM

## 2019-11-08 ENCOUNTER — Telehealth: Payer: Self-pay | Admitting: Cardiovascular Disease

## 2019-11-08 DIAGNOSIS — I2510 Atherosclerotic heart disease of native coronary artery without angina pectoris: Secondary | ICD-10-CM

## 2019-11-08 DIAGNOSIS — E785 Hyperlipidemia, unspecified: Secondary | ICD-10-CM

## 2019-11-08 DIAGNOSIS — E782 Mixed hyperlipidemia: Secondary | ICD-10-CM

## 2019-11-08 LAB — CBC
HCT: 33.8 % — ABNORMAL LOW (ref 39.0–52.0)
Hemoglobin: 10.5 g/dL — ABNORMAL LOW (ref 13.0–17.0)
MCH: 27.6 pg (ref 26.0–34.0)
MCHC: 31.1 g/dL (ref 30.0–36.0)
MCV: 88.9 fL (ref 80.0–100.0)
Platelets: 329 10*3/uL (ref 150–400)
RBC: 3.8 MIL/uL — ABNORMAL LOW (ref 4.22–5.81)
RDW: 14 % (ref 11.5–15.5)
WBC: 5.9 10*3/uL (ref 4.0–10.5)
nRBC: 0 % (ref 0.0–0.2)

## 2019-11-08 LAB — BASIC METABOLIC PANEL
Anion gap: 3 — ABNORMAL LOW (ref 5–15)
BUN: 9 mg/dL (ref 6–20)
CO2: 29 mmol/L (ref 22–32)
Calcium: 8.6 mg/dL — ABNORMAL LOW (ref 8.9–10.3)
Chloride: 110 mmol/L (ref 98–111)
Creatinine, Ser: 1.08 mg/dL (ref 0.61–1.24)
GFR calc Af Amer: >60 mL/min (ref >60)
GFR calc non Af Amer: >60 mL/min (ref >60)
Glucose, Bld: 113 mg/dL — ABNORMAL HIGH (ref 70–99)
Potassium: 3.9 mmol/L (ref 3.5–5.1)
Sodium: 142 mmol/L (ref 135–145)

## 2019-11-08 LAB — POCT ACTIVATED CLOTTING TIME
Activated Clotting Time: 202 seconds
Activated Clotting Time: 219 seconds
Activated Clotting Time: 224 seconds
Activated Clotting Time: 235 seconds
Activated Clotting Time: 252 seconds

## 2019-11-08 MED ORDER — PANTOPRAZOLE SODIUM 40 MG PO TBEC: 40.0000 mg | DELAYED_RELEASE_TABLET | Freq: Two times a day (BID) | ORAL | 3 refills | Status: DC

## 2019-11-08 MED ORDER — NITROGLYCERIN 0.4 MG SL SUBL: 0.4000 mg | SUBLINGUAL_TABLET | SUBLINGUAL | 3 refills | Status: DC | PRN

## 2019-11-08 MED ORDER — CARVEDILOL 6.25 MG PO TABS: 6.2500 mg | ORAL_TABLET | Freq: Two times a day (BID) | ORAL | 3 refills | Status: DC

## 2019-11-08 MED ORDER — ATORVASTATIN CALCIUM 80 MG PO TABS: 80.0000 mg | ORAL_TABLET | Freq: Every day | ORAL | 3 refills | Status: DC

## 2019-11-08 MED ORDER — TICAGRELOR 90 MG PO TABS: 90.0000 mg | ORAL_TABLET | Freq: Two times a day (BID) | ORAL | 3 refills | Status: DC

## 2019-11-08 MED ORDER — LOSARTAN POTASSIUM 25 MG PO TABS: 25.0000 mg | ORAL_TABLET | Freq: Every day | ORAL | 3 refills | Status: DC

## 2019-11-08 MED ORDER — ASPIRIN 81 MG PO CHEW: 81.0000 mg | CHEWABLE_TABLET | Freq: Every day | ORAL | 3 refills | Status: DC

## 2019-11-08 MED FILL — PANTOPRAZOLE SOD DR 40 MG T: 40 | 30 days supply | Qty: 60 | Fill #0

## 2019-11-08 MED FILL — ASPIRIN LOW DOSE 81 MG CHEW: 81 | 14 days supply | Qty: 14 | Fill #0

## 2019-11-08 MED FILL — ATORVASTATIN CALCIUM 80 MG: 80 | 30 days supply | Qty: 30 | Fill #0

## 2019-11-08 MED FILL — CARVEDILOL 6.25 MG TABLET: 6.25 | 30 days supply | Qty: 60 | Fill #0

## 2019-11-08 MED FILL — Midazolam HCl Inj 2 MG/2ML (Base Equivalent): INTRAMUSCULAR | Qty: 2 | Status: AC

## 2019-11-08 MED FILL — LOSARTAN POTASSIUM 25 MG TA: 25 | 30 days supply | Qty: 30 | Fill #0

## 2019-11-08 MED FILL — BRILINTA 90 MG TABLET: 90 | 30 days supply | Qty: 60 | Fill #0

## 2019-11-08 MED FILL — NITROGLYCERIN 0.4 MG TAB SL: 0.4 | 8 days supply | Qty: 25 | Fill #0

## 2019-11-08 NOTE — Progress Notes (Signed)
Progress Note  Patient Name: Stephen Avila Date of Encounter: 11/08/2019  Primary Cardiologist: No primary care provider on file. Dr. Claiborne Billings  Subjective   No chest pain this morning.  Feels well.  He is concerned about his prior history of GI bleeding and the antiplatelet agents that will be needed.  He has been walking in the halls without any problems.  Inpatient Medications    Scheduled Meds: . aspirin  81 mg Oral Daily  . atorvastatin  80 mg Oral Daily  . buprenorphine  20 mg Sublingual Daily  . carvedilol  6.25 mg Oral BID WC  . Chlorhexidine Gluconate Cloth  6 each Topical Daily  . enoxaparin (LOVENOX) injection  40 mg Subcutaneous Q24H  . linaclotide  145 mcg Oral Daily  . losartan  25 mg Oral Daily  . pantoprazole  40 mg Oral BID  . polyethylene glycol  17 g Oral Daily  . sodium chloride flush  3 mL Intravenous Q12H  . ticagrelor  90 mg Oral BID   Continuous Infusions: . sodium chloride     PRN Meds: sodium chloride, acetaminophen, diazepam, hydrOXYzine, nitroGLYCERIN, ondansetron (ZOFRAN) IV, sodium chloride flush   Vital Signs    Vitals:   11/08/19 0232 11/08/19 0400 11/08/19 0600 11/08/19 0747  BP: 107/63 103/63 102/68   Pulse: 71 68 61   Resp:      Temp:    98.7 F (37.1 C)  TempSrc:    Oral  SpO2: 96% 97% 98%   Weight:      Height:        Intake/Output Summary (Last 24 hours) at 11/08/2019 0855 Last data filed at 11/07/2019 1400 Gross per 24 hour  Intake 617.65 ml  Output --  Net 617.65 ml   Last 3 Weights 11/07/2019 11/07/2019 05/07/2019  Weight (lbs) 188 lb 0.8 oz 220 lb 7.4 oz 195 lb 1.6 oz  Weight (kg) 85.3 kg 100 kg 88.497 kg      Telemetry    Normal sinus rhythm- Personally Reviewed  ECG    Normal sinus rhythm, minimal inferior ST elevation- Personally Reviewed  Physical Exam   GEN: No acute distress.   Neck: No JVD Cardiac: RRR, no murmurs, rubs, or gallops.  Respiratory: Clear to auscultation bilaterally. GI: Soft, nontender,  non-distended  MS: No edema; No deformity.  Small hematoma at the right wrist, 2+ right radial pulse, 2+ posterior tibial pulses bilaterally Neuro:  Nonfocal  Psych: Normal affect   Labs    High Sensitivity Troponin:   Recent Labs  Lab 11/07/19 0304 11/07/19 0635 11/07/19 1100 11/07/19 1432  TROPONINIHS 673* 3,686* 12,131* 14,380*      Chemistry Recent Labs  Lab 11/07/19 0304 11/07/19 0635 11/08/19 0549  NA 139 137 142  K 2.8* 4.2 3.9  CL 104 105 110  CO2 24 21* 29  GLUCOSE 137* 107* 113*  BUN 12 10 9   CREATININE 1.12 0.98 1.08  CALCIUM 9.2 8.7* 8.6*  PROT 7.5  --   --   ALBUMIN 4.2  --   --   AST 29  --   --   ALT 24  --   --   ALKPHOS 70  --   --   BILITOT 0.8  --   --   GFRNONAA >60 >60 >60  GFRAA >60 >60 >60  ANIONGAP 11 11 3*     Hematology Recent Labs  Lab 11/07/19 0304 11/07/19 0635 11/08/19 0549  WBC 8.0 7.3 5.9  RBC  4.50 4.27 3.80*  HGB 12.3* 11.5* 10.5*  HCT 39.3 36.8* 33.8*  MCV 87.3 86.2 88.9  MCH 27.3 26.9 27.6  MCHC 31.3 31.3 31.1  RDW 14.0 13.9 14.0  PLT 448* 364 329    BNP Recent Labs  Lab 11/07/19 0635  BNP 18.6     DDimer No results for input(s): DDIMER in the last 168 hours.   Radiology    CARDIAC CATHETERIZATION  Result Date: 11/07/2019  Prox RCA lesion is 70% stenosed.  1st Mrg lesion is 70% stenosed.  2nd Mrg lesion is 100% stenosed.  Acute ST segment elevation myocardial infarction secondary to total occlusion of a large OM 2 vessel in a left dominant circulation. The left main is very short and immediately bifurcates into a normal large LAD vessel without significant obstruction; circumflex vessel gives rise to a high marginal OM1 which has a ramus distribution and has diffuse 70% proximal narrowing, the OM 2 vessel is totally occluded with TIMI 0 flow and the distal circumflex supplies the inferolateral and posterior lateral wall; small nondominant RCA with diffuse 60-70% proximal to mid irregularity/stenosis.  Preserved global LV contractility with EF estimated at 55% with possible subtle focal inferior hypocontractility. Successful PCI of the OM 2 vessel with PTCA and ultimate stenting with a 3.0 x 22 mm Resolute DES stent dilated to 3.1 mm with 100% occlusion being reduced to 0% and resumption of brisk TIMI-3 flow. RECOMMENDATION: DAPT for minimum of 12 months.  Initial adequate therapy for concomitant CAD of his large high marginal branch and small RCA vessel.  Initiate atorvastatin 80 mg for aggressive lipid-lowering therapy and attempt to induce plaque regression with target LDL less than 70 and preferably in the fifties or below.  Optimal blood pressure control.   ECHOCARDIOGRAM COMPLETE  Result Date: 11/07/2019    ECHOCARDIOGRAM REPORT   Patient Name:   Stephen Avila Date of Exam: 11/07/2019 Medical Rec #:  [id number]        Height:       66.0 in Accession #:    [id number]       Weight:       188.1 lb Date of Birth:  1975/07/11        BSA:          1.948 m Patient Age:    44 years         BP:           118/76 mmHg Patient Gender: M                HR:           78 bpm. Exam Location:  Inpatient Procedure: 2D Echo, Cardiac Doppler and Color Doppler Indications:    Acute Myocardial Infarction 410  History:        Patient has prior history of Echocardiogram examinations, most                 recent 05/06/2019. Risk Factors:Hypertension and GERD.  Sonographer:    Jonelle Sidle Dance Referring Phys: [id number] [place]  1. Normal LV systolic function; mild LVH; grade 2 diastolic dysfunction; mild LAE.  2. Left ventricular ejection fraction, by estimation, is 60 to 65%. The left ventricle has normal function. The left ventricle has no regional wall motion abnormalities. There is mild left ventricular hypertrophy. Left ventricular diastolic parameters are consistent with Grade II diastolic dysfunction (pseudonormalization).  3. Right ventricular systolic function is normal. The right ventricular size is normal.  4. Left atrial size was mildly dilated.  5. The mitral valve is normal in structure. Trivial mitral valve regurgitation. No evidence of mitral stenosis.  6. The aortic valve is tricuspid. Aortic valve regurgitation is not visualized. Mild aortic valve sclerosis is present, with no evidence of aortic valve stenosis.  7. The inferior vena cava is normal in size with greater than 50% respiratory variability, suggesting right atrial pressure of 3 mmHg. FINDINGS  Left Ventricle: Left ventricular ejection fraction, by estimation, is 60 to 65%. The left ventricle has normal function. The left ventricle has no regional wall motion abnormalities. The left ventricular internal cavity size was normal in size. There is  mild left ventricular hypertrophy. Left ventricular diastolic parameters are consistent with Grade II diastolic dysfunction (pseudonormalization). Right Ventricle: The right ventricular size is normal. Right ventricular systolic function is normal. Left Atrium: Left atrial size was mildly dilated. Right Atrium: Right atrial size was normal in size. Pericardium: There is no evidence of pericardial effusion. Mitral Valve: The mitral valve is normal in structure. Normal mobility of the mitral valve leaflets. Trivial mitral valve regurgitation. No evidence of mitral valve stenosis. Tricuspid Valve: The tricuspid valve is normal in structure. Tricuspid valve regurgitation is trivial. No evidence of tricuspid stenosis. Aortic Valve: The aortic valve is tricuspid. Aortic valve regurgitation is not visualized. Mild aortic valve sclerosis is present, with no evidence of aortic valve stenosis. Pulmonic Valve: The pulmonic valve was normal in structure. Pulmonic valve regurgitation is not visualized. No evidence of pulmonic stenosis. Aorta: The aortic root is normal in size and structure. Venous: The inferior vena cava is normal in size with greater than 50% respiratory variability, suggesting right atrial pressure of 3  mmHg. IAS/Shunts: No atrial level shunt detected by color flow Doppler. Additional Comments: Normal LV systolic function; mild LVH; grade 2 diastolic dysfunction; mild LAE.  LEFT VENTRICLE PLAX 2D LVIDd:         4.41 cm  Diastology LVIDs:         2.87 cm  LV e' lateral:   8.16 cm/s LV PW:         1.24 cm  LV E/e' lateral: 10.5 LV IVS:        0.80 cm  LV e' medial:    7.83 cm/s LVOT diam:     1.90 cm  LV E/e' medial:  11.0 LV SV:         84 LV SV Index:   43 LVOT Area:     2.84 cm  RIGHT VENTRICLE             IVC RV Basal diam:  2.61 cm     IVC diam: 1.92 cm RV S prime:     18.50 cm/s TAPSE (M-mode): 2.3 cm LEFT ATRIUM             Index       RIGHT ATRIUM           Index LA diam:        4.30 cm 2.21 cm/m  RA Area:     14.10 cm LA Vol (A2C):   94.0 ml 48.25 ml/m RA Volume:   34.50 ml  17.71 ml/m LA Vol (A4C):   54.5 ml 27.98 ml/m LA Biplane Vol: 72.7 ml 37.32 ml/m  AORTIC VALVE LVOT Vmax:   160.00 cm/s LVOT Vmean:  97.800 cm/s LVOT VTI:    0.298 m  AORTA Ao Root diam: 2.90 cm Ao Asc diam:  3.00 cm MITRAL  VALVE MV Area (PHT): 3.31 cm    SHUNTS MV Decel Time: 229 msec    Systemic VTI:  0.30 m MV E velocity: 85.90 cm/s  Systemic Diam: 1.90 cm MV A velocity: 76.60 cm/s MV E/A ratio:  1.12 Kirk Ruths MD Electronically signed by Kirk Ruths MD Signature Date/Time: 11/07/2019/2:45:42 PM    Final     Cardiac Studies   Echocardiogram shows normal LV function no significant mitral regurgitation or other valvular problems  Patient Profile     44 y.o. male with recent inferolateral MI  Assessment & Plan    CAD/MI: Status post DES to large OM.  Given his bleeding history, will continue dual antiplatelet therapy for 2 weeks.  After 2 weeks, stop aspirin.  Aspirin had caused problems for him in the past.  He has had 2 admissions for duodenal ulcers in the recent past.  We will see how he tolerates Brilinta.  Would consider stopping Brilinta and starting clopidogrel in a couple of months, to decrease his  bleeding risk.  Hypertension: We will hold amlodipine at this point.  He was out of his losartan.  We will restart that at discharge.  Continue carvedilol as well.  We will send the patient home on a high-dose statin for his lipids.  LDL was above target at 104, which is likely artificially low in the setting of MI.  Plan for discharge later today.  For questions or updates, please contact [place] Please consult [email or web address] for contact info under        Signed, Larae Grooms, MD  11/08/2019, 8:55 AM

## 2019-11-08 NOTE — Discharge Summary (Addendum)
Discharge Summary    Patient ID: Stephen Avila MRN: [id number]; DOB: Jun 06, 1976  Admit date: 11/07/2019 Discharge date: 11/08/2019  Primary Care Provider: Patient, No Pcp Per  Primary Cardiologist: Shelva Majestic, MD  Primary Electrophysiologist:  None   Discharge Diagnoses    Principal Problem:   ST elevation myocardial infarction (STEMI) [facility]) Active Problems:   Peptic ulcer disease   Essential hypertension   HLD (hyperlipidemia)   Coronary artery disease    Diagnostic Studies/Procedures    Left heart catheterization 11/07/19:  Prox RCA lesion is 70% stenosed.  1st Mrg lesion is 70% stenosed.  2nd Mrg lesion is 100% stenosed.   Acute ST segment elevation myocardial infarction secondary to total occlusion of a large OM 2 vessel in a left dominant circulation.  The left main is very short and immediately bifurcates into a normal large LAD vessel without significant obstruction; circumflex vessel gives rise to a high marginal OM1 which has a ramus distribution and has diffuse 70% proximal narrowing, the OM 2 vessel is totally occluded with TIMI 0 flow and the distal circumflex supplies the inferolateral and posterior lateral wall; small nondominant RCA with diffuse 60-70% proximal to mid irregularity/stenosis.  Preserved global LV contractility with EF estimated at 55% with possible subtle focal inferior hypocontractility.  Successful PCI of the OM 2 vessel with PTCA and ultimate stenting with a 3.0 x 22 mm Resolute DES stent dilated to 3.1 mm with 100% occlusion being reduced to 0% and resumption of brisk TIMI-3 flow.  RECOMMENDATION:  DAPT for minimum of 12 months.  Initial adequate therapy for concomitant CAD of his large high marginal branch and small RCA vessel.  Initiate atorvastatin 80 mg for aggressive lipid-lowering therapy and attempt to induce plaque regression with target LDL less than 70 and preferably in the fifties or below.  Optimal blood pressure  control.  Echocardiogram 11/07/19:  1. Normal LV systolic function; mild LVH; grade 2 diastolic dysfunction;  mild LAE.  2. Left ventricular ejection fraction, by estimation, is 60 to 65%. The  left ventricle has normal function. The left ventricle has no regional  wall motion abnormalities. There is mild left ventricular hypertrophy.  Left ventricular diastolic parameters  are consistent with Grade II diastolic dysfunction (pseudonormalization).  3. Right ventricular systolic function is normal. The right ventricular  size is normal.  4. Left atrial size was mildly dilated.  5. The mitral valve is normal in structure. Trivial mitral valve  regurgitation. No evidence of mitral stenosis.  6. The aortic valve is tricuspid. Aortic valve regurgitation is not  visualized. Mild aortic valve sclerosis is present, with no evidence of  aortic valve stenosis.  7. The inferior vena cava is normal in size with greater than 50%  respiratory variability, suggesting right atrial pressure of 3 mmHg.   _____________   History of Present Illness     Stephen Avila is a 44 y.o. male with HTN, peptic ulcer disease, hypogonadotropic hypogonadism, long-term buprenorphine use, and family history of heart disease admitted with STEMI.  Stephen Avila was sitting on his couch two hours prior to arrival in the ED when he developed sudden onset of crushing substernal chest pain, radiating to both arms and left side of jaw, associated with diaphoresis. On EMS arrival, BP was 174/80, HR 80s. ECG showed inferolateral ST elevations. Posterior ECG demonstrated ST elevations in V7-V9 as well. He received sublingual nitroglycerin tablet. On arrival to ED, BP was 112/60 with HR 80s. Serial ECG demonstrated increasing amplitude  of ST elevations. He was still having CP on arrival. He was taken emergently to the cath lab with Dr. Claiborne Billings. He was found to have acute occlusion of large OM2 and underwent successful PCI with Onyx  3-0 x 22 mm DES.    Stephen Avila reports a family history of heart disease - both parents were diagnosed in their 12s and have now passed away. He has a personal history of hypertension (on amlodipine and carvedilol) but no known HLD or diabetes.   He has known peptic ulcer disease, first diagnosed in 04/2019 when he presented to [facility] with melena and hemoglobin drop to 8. He had been taking Goody powder / BC powder 5-6 times a day prior to that presentation. 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 Biopsy was negative for H.pylori. Colonoscopy had poor prep but no notable lesions. He was started on omeprazole and iron supplements. He was then admitted to [facility] 4/12 with tachycardia and coffee ground emesis. EGD showed localized erosions with no active stigmata of bleeding in the prepyloric region of the stomach along with 1 nonbleeding cratered duodenal ulcer with clean base and 2 nonbleeding diverticula versus fistulous openings. CT abdomen pelvis with contrast and oral contrast was negative for enteroenteric fistula. Hemoglobin nadir was 9.8 this admission. He was discharged on pantoprazole BID. He was also evaluated during this admission for subclinical hypothyroidism, central hypogonadism, and hypercortisolism. Pituitary imaging was normal, IGF-1 (sent to evaluated for acromegaly) was within normal limits, so endocrine abnormalities were attributed to long-term opiate use.   Hospital Course     Consultants: None   1. CAD s/p STEMI 11/07/19: found to have inferolateral STE on EMS assessment. Taken emergently to the cath lab where he was found to have a 100% occlusion of a large OM2 vessel managed with PCI/DES, as well as high grade stenosis in the OM1 and non-dominate RCA which was medically managed. HsTrop peaked at 14000. Echo showed EF 60-65%, G2DD, mild LVH, mild LAE, and no  significant valvular abnormalities. He was started on aspirin and brilinta for DAPT with plans to stop the aspirin after 2 weeks given GIB history and continue brilinta uninterrupted x1 year.  - Continue aspirin and brilinta with plans to stop aspirin 11/21/19.  - Continue atorvastatin  2. HTN: BP stable with carvedilol and losartan. Home amlodipine held at discharge - Continue carvedilol 6.25mg  BID and losartan 25mg  daily  3. HLD: LDL 104. Started on atrovastatin 80mg  daily - Continue atorvastatin - Plan for repeat FLP/LFTs in 6-8 weeks  3. Peptic ulcer disease: history of GIB in the setting of increased NSAID use - Continue BID protonix - Recommended to stays off NSAIDs given elevated risk with DAPT   4. Buprenorphic dependence - Continue home dose 20mg  daily   5. Central hypopituitarism - on testosterone supplement as outpatient - repeat TFT with PCP as outpatient per last endocrine consult at [facility]   Did the patient have an acute coronary syndrome (MI, NSTEMI, STEMI, etc) this admission?:  Yes                               AHA/ACC Clinical Performance & Quality Measures: 1. Aspirin prescribed? - Yes 2. ADP Receptor Inhibitor (Plavix/Clopidogrel, Brilinta/Ticagrelor or Effient/Prasugrel) prescribed (includes medically managed patients)? - Yes 3. Beta Blocker prescribed? - Yes 4. High Intensity Statin (Lipitor 40-80mg  or  Crestor 20-40mg ) prescribed? - Yes 5. EF assessed during THIS hospitalization? - Yes 6. For EF <40%, was ACEI/ARB prescribed? - Yes 7. For EF <40%, Aldosterone Antagonist (Spironolactone or Eplerenone) prescribed? - Not Applicable (EF >/= [id number]) 8. Cardiac Rehab Phase II ordered (Included Medically managed Patients)? - Yes   _____________  Discharge Vitals Blood pressure 102/68, pulse 61, temperature 98.7 F (37.1 C), temperature source Oral, resp. rate 17, height 5\' 6"  (1.676 m), weight 85.3 kg, SpO2 98 %.  Filed Weights   11/07/19 0256 11/07/19 0450   Weight: 100 kg 85.3 kg    Labs & Radiologic Studies    CBC Recent Labs    11/07/19 0304 11/07/19 0304 11/07/19 0635 11/08/19 0549  WBC 8.0   < > 7.3 5.9  NEUTROABS 4.8  --   --   --   HGB 12.3*   < > 11.5* 10.5*  HCT 39.3   < > 36.8* 33.8*  MCV 87.3   < > 86.2 88.9  PLT 448*   < > 364 329   < > = values in this interval not displayed.   Basic Metabolic Panel Recent Labs    11/07/19 0635 11/08/19 0549  NA 137 142  K 4.2 3.9  CL 105 110  CO2 21* 29  GLUCOSE 107* 113*  BUN 10 9  CREATININE 0.98 1.08  CALCIUM 8.7* 8.6*   Liver Function Tests Recent Labs    11/07/19 0304  AST 29  ALT 24  ALKPHOS 70  BILITOT 0.8  PROT 7.5  ALBUMIN 4.2   No results for input(s): LIPASE, AMYLASE in the last 72 hours. High Sensitivity Troponin:   Recent Labs  Lab 11/07/19 0304 11/07/19 0635 11/07/19 1100 11/07/19 1432  TROPONINIHS 673* 3,686* 12,131* 14,380*    BNP Invalid input(s): POCBNP D-Dimer No results for input(s): DDIMER in the last 72 hours. Hemoglobin A1C Recent Labs    11/07/19 0304  HGBA1C 4.6*   Fasting Lipid Panel Recent Labs    11/07/19 0304  CHOL 177  HDL 39*  LDLCALC 104*  TRIG 168*  CHOLHDL 4.5   Thyroid Function Tests No results for input(s): TSH, T4TOTAL, T3FREE, THYROIDAB in the last 72 hours.  Invalid input(s): FREET3 _____________  CARDIAC CATHETERIZATION  Result Date: 11/07/2019  Prox RCA lesion is 70% stenosed.  1st Mrg lesion is 70% stenosed.  2nd Mrg lesion is 100% stenosed.  Acute ST segment elevation myocardial infarction secondary to total occlusion of a large OM 2 vessel in a left dominant circulation. The left main is very short and immediately bifurcates into a normal large LAD vessel without significant obstruction; circumflex vessel gives rise to a high marginal OM1 which has a ramus distribution and has diffuse 70% proximal narrowing, the OM 2 vessel is totally occluded with TIMI 0 flow and the distal circumflex supplies  the inferolateral and posterior lateral wall; small nondominant RCA with diffuse 60-70% proximal to mid irregularity/stenosis. Preserved global LV contractility with EF estimated at 55% with possible subtle focal inferior hypocontractility. Successful PCI of the OM 2 vessel with PTCA and ultimate stenting with a 3.0 x 22 mm Resolute DES stent dilated to 3.1 mm with 100% occlusion being reduced to 0% and resumption of brisk TIMI-3 flow. RECOMMENDATION: DAPT for minimum of 12 months.  Initial adequate therapy for concomitant CAD of his large high marginal branch and small RCA vessel.  Initiate atorvastatin 80 mg for aggressive lipid-lowering therapy and attempt to induce plaque regression with target LDL less  than 70 and preferably in the fifties or below.  Optimal blood pressure control.   ECHOCARDIOGRAM COMPLETE  Result Date: 11/07/2019    ECHOCARDIOGRAM REPORT   Patient Name:   Stephen Avila Date of Exam: 11/07/2019 Medical Rec #:  [id number]        Height:       66.0 in Accession #:    [id number]       Weight:       188.1 lb Date of Birth:  1975/08/21        BSA:          1.948 m Patient Age:    44 years         BP:           118/76 mmHg Patient Gender: M                HR:           78 bpm. Exam Location:  Inpatient Procedure: 2D Echo, Cardiac Doppler and Color Doppler Indications:    Acute Myocardial Infarction 410  History:        Patient has prior history of Echocardiogram examinations, most                 recent 05/06/2019. Risk Factors:Hypertension and GERD.  Sonographer:    Jonelle Sidle Dance Referring Phys: [id number] [place]  1. Normal LV systolic function; mild LVH; grade 2 diastolic dysfunction; mild LAE.  2. Left ventricular ejection fraction, by estimation, is 60 to 65%. The left ventricle has normal function. The left ventricle has no regional wall motion abnormalities. There is mild left ventricular hypertrophy. Left ventricular diastolic parameters are consistent with Grade II  diastolic dysfunction (pseudonormalization).  3. Right ventricular systolic function is normal. The right ventricular size is normal.  4. Left atrial size was mildly dilated.  5. The mitral valve is normal in structure. Trivial mitral valve regurgitation. No evidence of mitral stenosis.  6. The aortic valve is tricuspid. Aortic valve regurgitation is not visualized. Mild aortic valve sclerosis is present, with no evidence of aortic valve stenosis.  7. The inferior vena cava is normal in size with greater than 50% respiratory variability, suggesting right atrial pressure of 3 mmHg. FINDINGS  Left Ventricle: Left ventricular ejection fraction, by estimation, is 60 to 65%. The left ventricle has normal function. The left ventricle has no regional wall motion abnormalities. The left ventricular internal cavity size was normal in size. There is  mild left ventricular hypertrophy. Left ventricular diastolic parameters are consistent with Grade II diastolic dysfunction (pseudonormalization). Right Ventricle: The right ventricular size is normal. Right ventricular systolic function is normal. Left Atrium: Left atrial size was mildly dilated. Right Atrium: Right atrial size was normal in size. Pericardium: There is no evidence of pericardial effusion. Mitral Valve: The mitral valve is normal in structure. Normal mobility of the mitral valve leaflets. Trivial mitral valve regurgitation. No evidence of mitral valve stenosis. Tricuspid Valve: The tricuspid valve is normal in structure. Tricuspid valve regurgitation is trivial. No evidence of tricuspid stenosis. Aortic Valve: The aortic valve is tricuspid. Aortic valve regurgitation is not visualized. Mild aortic valve sclerosis is present, with no evidence of aortic valve stenosis. Pulmonic Valve: The pulmonic valve was normal in structure. Pulmonic valve regurgitation is not visualized. No evidence of pulmonic stenosis. Aorta: The aortic root is normal in size and structure.  Venous: The inferior vena cava is normal in size with greater than 50% respiratory  variability, suggesting right atrial pressure of 3 mmHg. IAS/Shunts: No atrial level shunt detected by color flow Doppler. Additional Comments: Normal LV systolic function; mild LVH; grade 2 diastolic dysfunction; mild LAE.  LEFT VENTRICLE PLAX 2D LVIDd:         4.41 cm  Diastology LVIDs:         2.87 cm  LV e' lateral:   8.16 cm/s LV PW:         1.24 cm  LV E/e' lateral: 10.5 LV IVS:        0.80 cm  LV e' medial:    7.83 cm/s LVOT diam:     1.90 cm  LV E/e' medial:  11.0 LV SV:         84 LV SV Index:   43 LVOT Area:     2.84 cm  RIGHT VENTRICLE             IVC RV Basal diam:  2.61 cm     IVC diam: 1.92 cm RV S prime:     18.50 cm/s TAPSE (M-mode): 2.3 cm LEFT ATRIUM             Index       RIGHT ATRIUM           Index LA diam:        4.30 cm 2.21 cm/m  RA Area:     14.10 cm LA Vol (A2C):   94.0 ml 48.25 ml/m RA Volume:   34.50 ml  17.71 ml/m LA Vol (A4C):   54.5 ml 27.98 ml/m LA Biplane Vol: 72.7 ml 37.32 ml/m  AORTIC VALVE LVOT Vmax:   160.00 cm/s LVOT Vmean:  97.800 cm/s LVOT VTI:    0.298 m  AORTA Ao Root diam: 2.90 cm Ao Asc diam:  3.00 cm MITRAL VALVE MV Area (PHT): 3.31 cm    SHUNTS MV Decel Time: 229 msec    Systemic VTI:  0.30 m MV E velocity: 85.90 cm/s  Systemic Diam: 1.90 cm MV A velocity: 76.60 cm/s MV E/A ratio:  1.12 Kirk Ruths MD Electronically signed by Kirk Ruths MD Signature Date/Time: 11/07/2019/2:45:42 PM    Final    Disposition   Pt is being discharged home today in good condition.  Follow-up Plans & Appointments     Discharge Instructions    AMB Referral to Cardiac Rehabilitation - Phase II   Complete by: As directed    Diagnosis: STEMI   After initial evaluation and assessments completed: Virtual Based Care may be provided alone or in conjunction with Phase 2 Cardiac Rehab based on patient barriers.: Yes      Discharge Medications   Allergies as of 11/08/2019      Reactions    Erythromycin    Made chest feel "funny"      Medication List    STOP taking these medications   amLODipine 5 MG tablet Commonly known as: NORVASC   carvedilol 10 MG 24 hr capsule Commonly known as: COREG CR   losartan-hydrochlorothiazide 100-25 MG tablet Commonly known as: HYZAAR     TAKE these medications   acetaminophen 325 MG tablet Commonly known as: TYLENOL Take 2 tablets (650 mg total) by mouth every 6 (six) hours as needed for mild pain (or Fever >/= 101).   aspirin 81 MG chewable tablet Chew 1 tablet (81 mg total) by mouth daily. Start taking on: Nov 09, 2019   atorvastatin 80 MG tablet Commonly known as: LIPITOR Take 1 tablet (80  mg total) by mouth daily. Start taking on: Nov 09, 2019   buprenorphine 8 MG Subl SL tablet Commonly known as: SUBUTEX Place 20 mg under the tongue daily.   carvedilol 6.25 MG tablet Commonly known as: COREG Take 1 tablet (6.25 mg total) by mouth 2 (two) times daily with a meal.   cyanocobalamin 1000 MCG tablet Take 1 tablet (1,000 mcg total) by mouth daily.   docusate sodium 100 MG capsule Commonly known as: Colace Take 1 capsule (100 mg total) by mouth 2 (two) times daily as needed for mild constipation.   ferrous sulfate 325 (65 FE) MG EC tablet Take 1 tablet (325 mg total) by mouth 2 (two) times daily.   fluticasone 50 MCG/ACT nasal spray Commonly known as: FLONASE Place 2 sprays into both nostrils daily.   hydrOXYzine 25 MG tablet Commonly known as: ATARAX/VISTARIL Take 25 mg by mouth every 6 (six) hours as needed for anxiety.   Linzess 145 MCG Caps capsule Generic drug: linaclotide Take 145 mcg by mouth daily.   losartan 25 MG tablet Commonly known as: COZAAR Take 1 tablet (25 mg total) by mouth daily. Start taking on: Nov 09, 2019   nitroGLYCERIN 0.4 MG SL tablet Commonly known as: NITROSTAT Place 1 tablet (0.4 mg total) under the tongue every 5 (five) minutes x 3 doses as needed for chest pain.    pantoprazole 40 MG tablet Commonly known as: PROTONIX Take 1 tablet (40 mg total) by mouth 2 (two) times daily. What changed: when to take this   testosterone cypionate 200 MG/ML injection Commonly known as: DEPOTESTOSTERONE CYPIONATE Inject 200 mg into the muscle every 14 (fourteen) days.   ticagrelor 90 MG Tabs tablet Commonly known as: BRILINTA Take 1 tablet (90 mg total) by mouth 2 (two) times daily.          Outstanding Labs/Studies   Repeat FLP/LFTs in 6-8 weeks  Duration of Discharge Encounter   Greater than 30 minutes including physician time.  Signed, Abigail Butts, PA-C 11/08/2019, 12:31 PM  I have examined the patient and reviewed assessment and plan and discussed with patient.  Agree with above as stated.    Out of work for 2 weeks.  Can reassess at f/u visit.  CAD/MI: Status post DES to large OM.  Given his GI bleeding history, will continue dual antiplatelet therapy for 2 weeks.  After 2 weeks, stop aspirin.  Aspirin had caused problems for him in the past.  He has had 2 admissions for duodenal ulcers in the recent past.  We will see how he tolerates Brilinta.  Would consider stopping Brilinta and starting clopidogrel in a couple of months, to decrease his bleeding risk.  Hypertension: We will hold amlodipine at this point.  He was out of his losartan.  We will restart that at discharge.  Continue carvedilol as well.  We will send the patient home on a high-dose statin for his lipids.  LDL was above target at 104, which is likely artificially low in the setting of MI.  Larae Grooms

## 2019-11-08 NOTE — Telephone Encounter (Signed)
Patient still currently admitted-  Attempt TOC call tomorrow 05/04

## 2019-11-08 NOTE — Telephone Encounter (Signed)
New message  Per Cherlynn Polo scheduled hospital follow up [facility] visit with Roby Lofts on 11/17/19 at 11:15am.

## 2019-11-08 NOTE — Care Management (Signed)
Per Destiny B. W/[employer] Help Desk  Co-pay amount for Brilinta 90 mg. bid for a 30 day supply $60.00.  No PA required No Deductible Tier 2  [facility]ail Pharmacy: [facility].

## 2019-11-08 NOTE — Discharge Instructions (Signed)
PLEASE REMEMBER TO BRING ALL OF YOUR MEDICATIONS TO EACH OF YOUR FOLLOW-UP OFFICE VISITS.  PLEASE ATTEND ALL SCHEDULED FOLLOW-UP APPOINTMENTS.   Activity: Increase activity slowly as tolerated. You may shower, but no soaking baths (or swimming) for 1 week. No driving for 24 hours. No lifting over 5 lbs for 1 week. No sexual activity for 1 week.   You May Return to Work: in 2 week (if applicable)  Wound Care: You may wash cath site gently with soap and water. Keep cath site clean and dry. If you notice pain, swelling, bleeding or pus at your cath site, please call [phone].   You will be taking aspirin and brilinta (ticagrelor) to help keep your stent open. Missing doses of these medications puts you at risk of blocking the stent and having another heart attack. It is important for you to stay away from other anti-inflammatory medications (motrin, ibuprofen, Goodies/BC powder, naproxen, etc)  to minimize your bleeding risk. In an effort to further minimize bleeding, you can stop your aspirin after a 2 week course - your last dose will be 11/21/19.

## 2019-11-08 NOTE — Progress Notes (Signed)
Removed all IVs,. Answered all questions to patients satisfaction. All belonging returned.  Kathleene Hazel RN

## 2019-11-08 NOTE — Progress Notes (Signed)
CARDIAC REHAB PHASE I   Offered to walk with pt, pt states he has been walking independently without difficulty. Pt denies CP, SOB, or dizziness. Pt states he noted his HR was low 100s upon return to room. Pt educated on importance of ASA, Brilinta, statin, and NTG. Pt given MI book, stent card, and heart healthy diet. Reviewed restrictions and exercise guidelines. Will refer to CRP II [place]. Pt is interested in participating in Virtual Cardiac and Pulmonary Rehab. Pt advised that Virtual Cardiac and Pulmonary Rehab is provided at no cost to the patient.  Checklist:  1. Pt has smart device  ie smartphone and/or ipad for downloading an app  Yes 2. Reliable internet/wifi service    Yes 3. Understands how to use their smartphone and navigate within an app.  Yes  Pt verbalized understanding and is in agreement.  [id number] Rufina Falco, RN BSN 11/08/2019 1:54 PM

## 2019-11-10 ENCOUNTER — Telehealth (HOSPITAL_COMMUNITY): Payer: Self-pay

## 2019-11-10 NOTE — Telephone Encounter (Signed)
Called patient to see if he is interested in the Cardiac Rehab Program. Patient expressed interest. Explained scheduling process and went over insurance, patient verbalized understanding. Will contact patient for scheduling once f/u has been completed.  °

## 2019-11-10 NOTE — Telephone Encounter (Signed)
Pt insurance is active and benefits verified through [place]. Co-pay $0.00, DED $1,750.00/$0.00 met, out of pocket $6,000.00/$60.96 met, co-insurance 20%. No pre-authorization required. Passport, 11/10/2019 @ 1:29PM, [facility]#[id number]  Will contact patient to see if he is interested in the Cardiac Rehab Program. If interested, patient will need to complete follow up appt. Once completed, patient will be contacted for scheduling upon review by the RN Navigator.

## 2019-11-11 NOTE — Telephone Encounter (Signed)
Patient contacted regarding discharge from [facility] on 11/08/2019.  Patient understands to follow up with provider Daleen Snook PA on 11/17/19 at 11:15 at [facility] Patient understands discharge instructions? YES Patient understands medications and regiment? YES Patient understands to bring all medications to this visit? YES  Ask patient:  Are you enrolled in My Chart (NO)  If no ask patient if they would like to enroll.   Reports SOB when tying shoes. He reports she SOB is fleeting, does not persist.  Reports real bad headache yesterday as well - was not home, so was unable to check BP BP has been stable  Advised if SOB worsened, please return call to office  Routed to PA as FYI, since appt is next week

## 2019-11-15 DIAGNOSIS — M4716 Other spondylosis with myelopathy, lumbar region: Secondary | ICD-10-CM | POA: Diagnosis not present

## 2019-11-16 NOTE — Progress Notes (Signed)
Cardiology Office Note   Date:  11/17/2019   ID:  Stephen Avila, DOB 1976/03/02, MRN [id number]  PCP:  Patient, No Pcp Per  Cardiologist:  Stephen Majestic, MD EP: None  Chief Complaint  Patient presents with   Hospitalization Follow-up    recent STEMI 11/07/19      History of Present Illness: Stephen Avila is a 44 y.o. male with PMH of CAD s/p recent STEMI 11/07/19 with PCI/DES to OM2, HTN, HLD,  PUD, buprenorphine dependency, and central hypopituitarism who presents for post-hospital follow-up.   He was admitted to the hospital from 11/07/19-11/08/19 after presenting with chest pain. He was found to have inferolateral STE on EKG and was taken emergently to the cath lab where he was found to have a 100% occlusion of a large OM2 vessel managed with PCI/DES, as well as high grade stenosis in the OM1 and non-dominate RCA which was medically managed. HsTrop peaked at 14000. Echo showed EF 60-65%, G2DD, mild LVH, mild LAE, and no significant valvular abnormalities. He was started on aspirin and brilinta for DAPT with plans to stop the aspirin after 2 weeks given GIB history and continue brilinta uninterrupted x1 year.  He presents today with his wife for post-hospital follow-up. He has been doing okay from a cardiac standpoint since discharge. 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 We discussed limiting activity to no more than 30lbs at a time. He has occasional non-exertional chest discomfort but nothing reminiscent of the chest pain experienced at the time of his MI. He has been having trouble with insomnia recently. He does snore and reports daytime somnolence; sleep score was 11. He has also had 2 migraines since his MI, both precipitated by left eye vision changes and a brief episode of left sided facial numbness on one occasion. No other focal neurologic deficits. No complaints of SOB, LE edema, orthopnea, PND, dizziness,  lightheadedness, or syncope.    Past Medical History:  Diagnosis Date   Anxiety    Constipation    GERD (gastroesophageal reflux disease)    Hypertension    Kidney stones    Kidney stones     Past Surgical History:  Procedure Laterality Date   CHOLECYSTECTOMY N/A 06/26/2015   Procedure: LAPAROSCOPIC CHOLECYSTECTOMY;  Surgeon: Stephen Signs, MD;  Location: [facility];  Service: General;  Laterality: N/A;   COLONOSCOPY WITH PROPOFOL N/A 05/07/2019   Procedure: COLONOSCOPY WITH PROPOFOL;  Surgeon: Stephen Landsman, MD;  Location: [facility] ENDOSCOPY;  Service: Gastroenterology;  Laterality: N/A;   CORONARY/GRAFT ACUTE MI REVASCULARIZATION N/A 11/07/2019   Procedure: Coronary/Graft Acute MI Revascularization;  Surgeon: Stephen Sine, MD;  Location: [place] [facility];  Service: Cardiovascular;  Laterality: N/A;   ESOPHAGOGASTRODUODENOSCOPY N/A 05/07/2019   Procedure: ESOPHAGOGASTRODUODENOSCOPY (EGD);  Surgeon: Stephen Landsman, MD;  Location: [facility] ENDOSCOPY;  Service: Gastroenterology;  Laterality: N/A;   HERNIA REPAIR     x 2    LEFT HEART CATH AND CORONARY ANGIOGRAPHY N/A 11/07/2019   Procedure: LEFT HEART CATH AND CORONARY ANGIOGRAPHY;  Surgeon: Stephen Sine, MD;  Location: [place] [facility];  Service: Cardiovascular;  Laterality: N/A;   LITHOTRIPSY       Current Outpatient Medications  Medication Sig Dispense Refill   acetaminophen (TYLENOL) 325 MG tablet Take 2 tablets (650 mg total) by mouth every 6 (six) hours as needed for mild pain (or Fever >/= 101).  aspirin 81 MG chewable tablet Chew 1 tablet (81 mg total) by mouth daily. 90 tablet 3   atorvastatin (LIPITOR) 80 MG tablet Take 1 tablet (80 mg total) by mouth daily. 90 tablet 3   buprenorphine (SUBUTEX) 8 MG SUBL SL tablet Place 20 mg under the tongue daily.      carvedilol (COREG) 6.25 MG tablet Take 1 tablet (6.25 mg total) by mouth 2 (two) times daily with a meal. 180 tablet 3   docusate sodium  (COLACE) 100 MG capsule Take 1 capsule (100 mg total) by mouth 2 (two) times daily as needed for mild constipation. 60 capsule 0   ferrous sulfate 325 (65 FE) MG EC tablet Take 1 tablet (325 mg total) by mouth 2 (two) times daily. 60 tablet 3   fluticasone (FLONASE) 50 MCG/ACT nasal spray Place 2 sprays into both nostrils daily. 16 g 0   hydrOXYzine (ATARAX/VISTARIL) 25 MG tablet Take 25 mg by mouth every 6 (six) hours as needed for anxiety.      linaclotide (LINZESS) 145 MCG CAPS capsule Take 145 mcg by mouth daily.     losartan (COZAAR) 25 MG tablet Take 1 tablet (25 mg total) by mouth daily. 90 tablet 3   nitroGLYCERIN (NITROSTAT) 0.4 MG SL tablet Place 1 tablet (0.4 mg total) under the tongue every 5 (five) minutes x 3 doses as needed for chest pain. 25 tablet 3   pantoprazole (PROTONIX) 40 MG tablet Take 1 tablet (40 mg total) by mouth 2 (two) times daily. 180 tablet 3   testosterone cypionate (DEPOTESTOSTERONE CYPIONATE) 200 MG/ML injection Inject 200 mg into the muscle every 14 (fourteen) days.      ticagrelor (BRILINTA) 90 MG TABS tablet Take 1 tablet (90 mg total) by mouth 2 (two) times daily. 180 tablet 3   vitamin B-12 1000 MCG tablet Take 1 tablet (1,000 mcg total) by mouth daily. 30 tablet 0   amLODipine (NORVASC) 5 MG tablet Take 1 tablet (5 mg total) by mouth daily. 90 tablet 3   No current facility-administered medications for this visit.    Allergies:   Erythromycin    Social History:  The patient  reports that he has never smoked. His smokeless tobacco use includes snuff. He reports that he does not drink alcohol or use drugs.   Family History:  The patient's family history includes COPD in his mother; Heart failure in his father.    ROS:  Please see the history of present illness.   Otherwise, review of systems are positive for none.   All other systems are reviewed and negative.    PHYSICAL EXAM: VS:  BP 132/90    Pulse 82    Temp (!) 97.2 F (36.2 C)    Ht  5\' 6"  (1.676 m)    Wt 196 lb (88.9 kg)    SpO2 99%    BMI 31.64 kg/m  , BMI Body mass index is 31.64 kg/m. GEN: Well nourished, well developed, in no acute distress HEENT: sclera anicteric Neck: no JVD, carotid bruits, or masses Cardiac: RRR; no murmurs, rubs, or gallops, no edema, small hematoma at right radial cath site without ecchymosis or bleeding.  Respiratory:  clear to auscultation bilaterally, normal work of breathing GI: soft, nontender, nondistended, + BS MS: no deformity or atrophy Skin: warm and dry, no rash Neuro:  Strength and sensation are intact Psych: euthymic mood, full affect   EKG:  EKG is ordered today. The ekg ordered today demonstrates sinus rhythm with  rate 82 bpm, no STE/D, isolated TWI in III, no significant change from previous.    Recent Labs: 05/06/2019: TSH 0.033 11/07/2019: ALT 24; B Natriuretic Peptide 18.6 11/08/2019: BUN 9; Creatinine, Ser 1.08; Hemoglobin 10.5; Platelets 329; Potassium 3.9; Sodium 142    Lipid Panel    Component Value Date/Time   CHOL 177 11/07/2019 0304   TRIG 168 (H) 11/07/2019 0304   HDL 39 (L) 11/07/2019 0304   CHOLHDL 4.5 11/07/2019 0304   VLDL 34 11/07/2019 0304   LDLCALC 104 (H) 11/07/2019 0304      Wt Readings from Last 3 Encounters:  11/17/19 196 lb (88.9 kg)  11/07/19 188 lb 0.8 oz (85.3 kg)  05/07/19 195 lb 1.6 oz (88.5 kg)      Other studies Reviewed: Additional studies/ records that were reviewed today include:   Left heart catheterization 11/07/19:  Prox RCA lesion is 70% stenosed.  1st Mrg lesion is 70% stenosed.  2nd Mrg lesion is 100% stenosed.  Acute ST segment elevation myocardial infarction secondary to total occlusion of a large OM 2 vessel in a left dominant circulation.  The left main is very short and immediately bifurcates into a normal large LAD vessel without significant obstruction; circumflex vessel gives rise to a high marginal OM1 which has a ramus distribution and has diffuse 70%  proximal narrowing, the OM 2 vessel is totally occluded with TIMI 0 flow and the distal circumflex supplies the inferolateral and posterior lateral wall; small nondominant RCA with diffuse 60-70% proximal to mid irregularity/stenosis.  Preserved global LV contractility with EF estimated at 55% with possible subtle focal inferior hypocontractility.  Successful PCI of the OM 2 vessel with PTCA and ultimate stenting with a 3.0 x 22 mm Resolute DES stent dilated to 3.1 mm with 100% occlusion being reduced to 0% and resumption of brisk TIMI-3 flow.  RECOMMENDATION:  DAPT for minimum of 12 months. Initial adequate therapy for concomitant CAD of his large high marginal branch and small RCA vessel. Initiate atorvastatin 80 mg for aggressive lipid-lowering therapy and attempt to induce plaque regression with target LDL less than 70 and preferably in the fifties or below. Optimal blood pressure control.  Echocardiogram 11/07/19:  1. Normal LV systolic function; mild LVH; grade 2 diastolic dysfunction;  mild LAE.  2. Left ventricular ejection fraction, by estimation, is 60 to 65%. The  left ventricle has normal function. The left ventricle has no regional  wall motion abnormalities. There is mild left ventricular hypertrophy.  Left ventricular diastolic parameters  are consistent with Grade II diastolic dysfunction (pseudonormalization).  3. Right ventricular systolic function is normal. The right ventricular  size is normal.  4. Left atrial size was mildly dilated.  5. The mitral valve is normal in structure. Trivial mitral valve  regurgitation. No evidence of mitral stenosis.  6. The aortic valve is tricuspid. Aortic valve regurgitation is not  visualized. Mild aortic valve sclerosis is present, with no evidence of  aortic valve stenosis.  7. The inferior vena cava is normal in size with greater than 50%  respiratory variability, suggesting right atrial pressure of 3 mmHg.      ASSESSMENT AND PLAN:  1. CAD s/p recent STEMI 11/07/19 with PCI/DES to OM2: some non-exertional chest pain not like pain experienced at the time of his STEMI. No bleeding concerns on DAPT - Continue aspirin and brilinta with plans to stop aspirin 11/21/19 - Continue atorvastatin - Will start amlodipine 5mg  daily for added anti-anginal effect given residual 70%  stenosis in his RCA and OM1.   2. HTN: BP 132/90 today - Will restart amlodipine 5mg  daily in an effort to get BP to gaol of <130/80 - Continue carvedilol and losartan  3. HLD: LDL 104 11/08/19. Started on atorvastatin during recent admission. - Continue atorvastatin - Will recheck LFTs/FLP in 6 weeks  4. PUD: history of GIB in the setting of increased NSAID use with uprovoked recurrent bleeding  - Continue PPI BID - Will stop aspirin 11/21/19 in an effort to minimize bleeding risk  5. Migraines: 2 episodes of migraines both precipitated by what sounds like a visual aura with left eye blurry vision and a brief episode of mouth numbness with one episode. No focal neurologic deficits.  - Will send a referral to Neurology   6. Daytime somnolence: he has trouble with insomnia and daytime somnolence, as well as snoring, nocturia, and irritability.  - Will check a sleep study to evaluate for possible OSA   Current medicines are reviewed at length with the patient today.  The patient does not have concerns regarding medicines.  The following changes have been made:  As above  Labs/ tests ordered today include:   Orders Placed This Encounter  Procedures   Hepatic function panel   Lipid panel   Basic metabolic panel   Ambulatory referral to Neurology   EKG 12-Lead   Split night study     Disposition:   FU with Dr. Claiborne Billings in 3 months  Signed, Abigail Butts, PA-C  11/17/2019 10:53 PM

## 2019-11-17 ENCOUNTER — Encounter: Payer: Self-pay | Admitting: Medical

## 2019-11-17 ENCOUNTER — Ambulatory Visit (INDEPENDENT_AMBULATORY_CARE_PROVIDER_SITE_OTHER): Payer: BC Managed Care – PPO | Admitting: Medical

## 2019-11-17 ENCOUNTER — Other Ambulatory Visit: Payer: Self-pay

## 2019-11-17 VITALS — BP 132/90 | HR 82 | Temp 97.2°F | Ht 66.0 in | Wt 196.0 lb

## 2019-11-17 DIAGNOSIS — E782 Mixed hyperlipidemia: Secondary | ICD-10-CM | POA: Diagnosis not present

## 2019-11-17 DIAGNOSIS — R400 Somnolence: Secondary | ICD-10-CM

## 2019-11-17 DIAGNOSIS — Z79899 Other long term (current) drug therapy: Secondary | ICD-10-CM

## 2019-11-17 DIAGNOSIS — I2510 Atherosclerotic heart disease of native coronary artery without angina pectoris: Secondary | ICD-10-CM | POA: Diagnosis not present

## 2019-11-17 DIAGNOSIS — K279 Peptic ulcer, site unspecified, unspecified as acute or chronic, without hemorrhage or perforation: Secondary | ICD-10-CM

## 2019-11-17 DIAGNOSIS — G43909 Migraine, unspecified, not intractable, without status migrainosus: Secondary | ICD-10-CM

## 2019-11-17 DIAGNOSIS — I10 Essential (primary) hypertension: Secondary | ICD-10-CM

## 2019-11-17 MED ORDER — AMLODIPINE BESYLATE 5 MG PO TABS
5.0000 mg | ORAL_TABLET | Freq: Every day | ORAL | 3 refills | Status: DC
Start: 2019-11-17 — End: 2020-12-05

## 2019-11-17 NOTE — Patient Instructions (Addendum)
Medication Instructions:   START Amlodipine 5 mg daily  STOP Aspirin on 11/21/2019  CONTINUE Brilinta  *If you need a refill on your cardiac medications before your next appointment, please call your pharmacy*  Lab Work: Your physician recommends that you return for lab work in [place]:   BMET  Your physician recommends that you return for lab work in [place] MIDNIGHT. OKAY TO HAVE WATER.   HEPATIC (LIVER) FUNCTION TEST  If you have labs (blood work) drawn today and your tests are completely normal, you will receive your results only by: Marland Kitchen MyChart Message (if you have MyChart) OR . A paper copy in the mail If you have any lab test that is abnormal or we need to change your treatment, we will call you to review the results.  Testing/Procedures: You have been referred to [place] 2-4 WEEKS   Your physician has recommended that you have a sleep study. This test records several body functions during sleep, including: brain activity, eye movement, oxygen and carbon dioxide blood levels, heart rate and rhythm, breathing rate and rhythm, the flow of air through your mouth and nose, snoring, body muscle movements, and chest and belly movement.  PLEASE SCHEDULE FOR 1-2 MONTHS   Follow-Up: At [facility], you and your health needs are our priority.  As part of our continuing mission to provide you with exceptional heart care, we have created designated Provider Care Teams.  These Care Teams include your primary Cardiologist (physician) and Advanced Practice Providers (APPs -  Physician Assistants and Nurse Practitioners) who all work together to provide you with the care you need, when you need it.  We recommend signing up for the patient portal called "MyChart".  Sign up information is provided on this After Visit Summary.  MyChart is used to connect with patients for Virtual Visits (Telemedicine).  Patients are able  to view lab/test results, encounter notes, upcoming appointments, etc.  Non-urgent messages can be sent to your provider as well.   To learn more about what you can do with MyChart, go to [email or web address].    Your next appointment:   3 month(s)  The format for your next appointment:   In Person  Provider:   Shelva Majestic, MD  Other Instructions

## 2019-11-18 ENCOUNTER — Telehealth: Payer: Self-pay

## 2019-11-18 LAB — BASIC METABOLIC PANEL
BUN/Creatinine Ratio: 9 (ref 9–20)
BUN: 9 mg/dL (ref 6–24)
CO2: 26 mmol/L (ref 20–29)
Calcium: 9.2 mg/dL (ref 8.7–10.2)
Chloride: 102 mmol/L (ref 96–106)
Creatinine, Ser: 1.01 mg/dL (ref 0.76–1.27)
GFR calc Af Amer: 104 mL/min/{1.73_m2} (ref >59)
GFR calc non Af Amer: 90 mL/min/{1.73_m2} (ref >59)
Glucose: 90 mg/dL (ref 65–99)
Potassium: 4.5 mmol/L (ref 3.5–5.2)
Sodium: 139 mmol/L (ref 134–144)

## 2019-11-18 NOTE — Telephone Encounter (Addendum)
I tried calling patient about his recent lab results. I could not leave a voice message due to patient not having set up his voicemail. I will mail out the resutls with comments from PA.   ----- Message from Abigail Butts, PA-C sent at 11/18/2019  7:44 AM EDT ----- Please notify the patient that his kidney function and electrolyte levels look good. He can continue his medications as prescribed at his visit yesterday. Thank you!

## 2019-11-22 ENCOUNTER — Encounter (HOSPITAL_COMMUNITY): Payer: Self-pay

## 2019-11-22 ENCOUNTER — Telehealth (HOSPITAL_COMMUNITY): Payer: Self-pay

## 2019-11-22 NOTE — Telephone Encounter (Signed)
Attempted to call patient in regards to [place] not set, unable to leave VM.  Mailed letter

## 2019-11-23 ENCOUNTER — Encounter: Payer: Self-pay | Admitting: *Deleted

## 2019-11-23 ENCOUNTER — Telehealth: Payer: Self-pay | Admitting: *Deleted

## 2019-11-23 ENCOUNTER — Telehealth: Payer: Self-pay | Admitting: Medical

## 2019-11-23 NOTE — Telephone Encounter (Deleted)
Sleep study sent to precert.

## 2019-11-23 NOTE — Telephone Encounter (Signed)
Spoke with wife who report pt is experiencing numbness and weakness on the left side associated with a headache. Nurse advised wife to have pt seek emergent care for further evaluations. Wife verbalized understanding.

## 2019-11-23 NOTE — Telephone Encounter (Signed)
Follow Up:     Pt's headaches are getting to be more frequent. Pt still have not heard from Neurologist, would somebody please check on the referral.

## 2019-11-23 NOTE — Telephone Encounter (Signed)
-----   Message from Koleen Distance sent at 11/17/2019 12:41 PM EDT ----- Sleep study referral

## 2019-11-23 NOTE — Telephone Encounter (Signed)
Patient's wife calling back wanting to know if patient needs to go to the ED. She states he gets a numbness on the left side of his face on his mouth and it lasts a couple seconds. She states he also has some dizziness. She states he is coming home from [place] and has not checked his BP. She states he has been having these symptoms off and on since last Tuesday, but they are getting progressively worse. She states his body feels weak and tired. She is very concerned and would like a call back soon on whether he needs to go to the hospital.

## 2019-11-23 NOTE — Telephone Encounter (Signed)
This encounter was created in error - please disregard.

## 2019-11-23 NOTE — Telephone Encounter (Signed)
Spoke with patients wife and number to Neurology given to call directly.  She  wanted to Teodoro Kil PA to know that patient is having profuse sweating, headaches and distorted vision now, denies chest pain. He was having the profuse sweating prior to MI  Did advise to call neurology and will forward to [facility] PA for review, will call back if any further recommendations.

## 2019-12-07 ENCOUNTER — Telehealth (HOSPITAL_COMMUNITY): Payer: Self-pay

## 2019-12-07 NOTE — Telephone Encounter (Signed)
No response from pt regarding CR.  Closed referral.  

## 2019-12-15 ENCOUNTER — Telehealth: Payer: Self-pay | Admitting: Medical

## 2019-12-15 ENCOUNTER — Other Ambulatory Visit: Payer: Self-pay | Admitting: *Deleted

## 2019-12-15 DIAGNOSIS — G43909 Migraine, unspecified, not intractable, without status migrainosus: Secondary | ICD-10-CM

## 2019-12-15 NOTE — Telephone Encounter (Signed)
Spoke with pt wife, according to [facility] they are trying to get in touch with the patient to schedule. phone number given. She is concerned it maybe too far out. Explained to let us know if appointment is not appropriate and we will give them a call.

## 2019-12-15 NOTE — Telephone Encounter (Signed)
     I went in pt's chart to see who wife had talked to. I called Hilda Blades and told her wife was back on the phone. She said she would the Neurologist office again.

## 2019-12-15 NOTE — Telephone Encounter (Signed)
New Message:    Pt can not be seen by the Neurologist that Daleen Snook referred him until August or September. He is getting worse, she would like another referral to another Neurologist please.

## 2019-12-16 NOTE — Progress Notes (Signed)
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  History supplemented by hospital and referring provider's notes.  He was admitted to [facility] on 11/07/2019 for STEMI and underwent PCI/DES for occluded large OM2 vessel.  Following discharge, he started experiencing new headaches.  He would see a bright light followed by cloudy vision in his left eye for 30 minutes followed by onset of 7-8/10 pain on right top of his head lasting all day.  Some associated nausea and photophobia.  On a couple of occasions, he had tingling on the left side of his lip and tongue for a few seconds.  He has taken Tylenol, which is ineffective.  No specific triggers.  Laying down to rest helps.  He reports that he had one severe migraine many years ago.  Otherwise, he has had mild infrequent headaches consistent with tension-type headache.  His mother had migraines.  MRI of brain and pituitary gland with and without contrast on 10/04/2015 personally reviewed was normal.  Current NSAIDS:  ASA 81mg  daily Current analgesics:  acetaminophen Current triptans:  none Current ergotamine:  none Current anti-emetic:  none Current muscle relaxants:  none Current anti-anxiolytic:  hydroxyzine Current sleep aide:  none Current Antihypertensive medications:  Losartan, Coreg, amlodipine Current Antidepressant medications:  none Current Anticonvulsant medications:  none Current anti-CGRP:  none Current Vitamins/Herbal/Supplements:  B12, ferrous sulfate Current Antihistamines/Decongestants:  none Other therapy:  none Hormone/birth control:  testosterone Other medications:   Buprenorphine, NTG, Brilinta, atorvastatin  Past NSAIDS:  none Past analgesics:  none Past abortive triptans:  none Past abortive ergotamine:  none Past muscle relaxants:  none Past anti-emetic:  none Past antihypertensive medications:  none Past antidepressant medications:  none Past anticonvulsant medications:  none Past anti-CGRP:  none    PAST MEDICAL HISTORY: Past Medical History:  Diagnosis Date  . Anxiety   . Constipation   . GERD (gastroesophageal reflux disease)   . Hypertension   . Kidney stones   . Kidney stones     PAST SURGICAL HISTORY: Past Surgical History:  Procedure Laterality Date  . CHOLECYSTECTOMY N/A 06/26/2015   Procedure: LAPAROSCOPIC CHOLECYSTECTOMY;  Surgeon: [name], MD;  Location: [facility];  Service: General;  Laterality: N/A;  . COLONOSCOPY WITH PROPOFOL N/A 05/07/2019   Procedure: COLONOSCOPY WITH PROPOFOL;  Surgeon: [name], MD;  Location: [facility] ENDOSCOPY;  Service: Gastroenterology;  Laterality: N/A;  . CORONARY/GRAFT ACUTE MI REVASCULARIZATION N/A 11/07/2019   Procedure: Coronary/Graft Acute MI Revascularization;  Surgeon: [name], MD;  Location: [place] [facility];  Service: Cardiovascular;  Laterality: N/A;  . ESOPHAGOGASTRODUODENOSCOPY N/A 05/07/2019   Procedure: ESOPHAGOGASTRODUODENOSCOPY (EGD);  Surgeon: [name], MD;  [facility] ENDOSCOPY;  Service: Gastroenterology;  Laterality: N/A;  . HERNIA REPAIR     x 2   . LEFT HEART CATH AND CORONARY ANGIOGRAPHY N/A 11/07/2019   Procedure: LEFT HEART CATH AND CORONARY ANGIOGRAPHY;  Surgeon: [name], MD;  Location: [place] [facility];  Service: Cardiovascular;  Laterality: N/A;  . LITHOTRIPSY      MEDICATIONS: Current Outpatient Medications on File Prior to Visit  Medication Sig Dispense Refill  . acetaminophen (TYLENOL) 325 MG tablet Take  2 tablets (650 mg total) by mouth every 6 (six) hours as needed for mild pain (or Fever >/= 101).    Marland Kitchen amLODipine  (NORVASC) 5 MG tablet Take 1 tablet (5 mg total) by mouth daily. 90 tablet 3  . aspirin 81 MG chewable tablet Chew 1 tablet (81 mg total) by mouth daily. 90 tablet 3  . atorvastatin (LIPITOR) 80 MG tablet Take 1 tablet (80 mg total) by mouth daily. 90 tablet 3  . buprenorphine (SUBUTEX) 8 MG SUBL SL tablet Place 20 mg under the tongue daily.     . carvedilol (COREG) 6.25 MG tablet Take 1 tablet (6.25 mg total) by mouth 2 (two) times daily with a meal. 180 tablet 3  . docusate sodium (COLACE) 100 MG capsule Take 1 capsule (100 mg total) by mouth 2 (two) times daily as needed for mild constipation. 60 capsule 0  . ferrous sulfate 325 (65 FE) MG EC tablet Take 1 tablet (325 mg total) by mouth 2 (two) times daily. 60 tablet 3  . fluticasone (FLONASE) 50 MCG/ACT nasal spray Place 2 sprays into both nostrils daily. 16 g 0  . hydrOXYzine (ATARAX/VISTARIL) 25 MG tablet Take 25 mg by mouth every 6 (six) hours as needed for anxiety.     Marland Kitchen linaclotide (LINZESS) 145 MCG CAPS capsule Take 145 mcg by mouth daily.    Marland Kitchen losartan (COZAAR) 25 MG tablet Take 1 tablet (25 mg total) by mouth daily. 90 tablet 3  . nitroGLYCERIN (NITROSTAT) 0.4 MG SL tablet Place 1 tablet (0.4 mg total) under the tongue every 5 (five) minutes x 3 doses as needed for chest pain. 25 tablet 3  . pantoprazole (PROTONIX) 40 MG tablet Take 1 tablet (40 mg total) by mouth 2 (two) times daily. 180 tablet 3  . testosterone cypionate (DEPOTESTOSTERONE CYPIONATE) 200 MG/ML injection Inject 200 mg into the muscle every 14 (fourteen) days.     . ticagrelor (BRILINTA) 90 MG TABS tablet Take 1 tablet (90 mg total) by mouth 2 (two) times daily. 180 tablet 3  . vitamin B-12 1000 MCG tablet Take 1 tablet (1,000 mcg total) by mouth daily. 30 tablet 0   No current facility-administered medications on file prior to visit.    ALLERGIES: Allergies  Allergen Reactions  . Erythromycin     Made chest feel "funny"    FAMILY HISTORY: Family History    Problem Relation Age of Onset  . COPD Mother   . Heart failure Father   . Thyroid disease Neg Hx    SOCIAL HISTORY: Social History   Socioeconomic History  . Marital status: Divorced    Spouse name: Not on file  . Number of children: Not on file  . Years of education: Not on file  . Highest education level: Not on file  Occupational History  . Not on file  Tobacco Use  . Smoking status: Never Smoker  . Smokeless tobacco: Current User    Types: Snuff  Substance and Sexual Activity  . Alcohol use: No  . Drug use: No  . Sexual activity: Not on file  Other Topics Concern  . Not on file  Social History Narrative  . Not on file   Social Determinants of Health   Financial Resource Strain: Low Risk   . Difficulty of Paying Living Expenses: Not hard at all  Food Insecurity: No Food Insecurity  . Worried About Charity fundraiser in the Last Year: Never true  . Ran Out of Food  in the Last Year: Never true  Transportation Needs: No Transportation Needs  . Lack of Transportation (Medical): No  . Lack of Transportation (Non-Medical): No  Physical Activity: Sufficiently Active  . Days of Exercise per Week: 6 days  . Minutes of Exercise per Session: 150+ min  Stress: Stress Concern Present  . Feeling of Stress : Very much  Social Connections: Somewhat Isolated  . Frequency of Communication with Friends and Family: More than three times a week  . Frequency of Social Gatherings with Friends and Family: Once a week  . Attends Religious Services: 1 to 4 times per year  . Active Member of Clubs or Organizations: No  . Attends Archivist Meetings: Never  . Marital Status: Divorced  Human resources officer Violence: Not At Risk  . Fear of Current or Ex-Partner: No  . Emotionally Abused: No  . Physically Abused: No  . Sexually Abused: No    PHYSICAL EXAM: Blood pressure (!) 153/90, pulse (!) 106, height 5\' 6"  (1.676 m), weight 200 lb 6.4 oz (90.9 kg), SpO2 100 %. General: No  acute distress.  Patient appears well-groomed.   Head:  Normocephalic/atraumatic Eyes:  fundi examined but not visualized Neck: supple, no paraspinal tenderness, full range of motion Back: No paraspinal tenderness Heart: regular rate and rhythm Lungs: Clear to auscultation bilaterally. Vascular: No carotid bruits. Neurological Exam: Mental status: alert and oriented to person, place, and time, recent and remote memory intact, fund of knowledge intact, attention and concentration intact, speech fluent and not dysarthric, language intact. Cranial nerves: CN I: not tested CN II: pupils equal, round and reactive to light, visual fields intact CN III, IV, VI:  full range of motion, no nystagmus, no ptosis CN V: facial sensation intact CN VII: upper and lower face symmetric CN VIII: hearing intact CN IX, X: gag intact, uvula midline CN XI: sternocleidomastoid and trapezius muscles intact CN XII: tongue midline Bulk & Tone: normal, no fasciculations. Motor:  5/5 throughout  Sensation:  temperature and vibration sensation intact.   Deep Tendon Reflexes:  2+ throughout, toes downgoing.   Finger to nose testing:  Without dysmetria.   Heel to shin:  Without dysmetria.   Gait:  Normal station and stride.  Able to turn.  Romberg negative  IMPRESSION: 1.  Migraine with aura, without status migrainosus, not intractable 2.  CAD with recent MI  Probable migraine with aura, but given that these are new onset headaches with visual disturbance following a cardiac event, I would like to evaluate for cerebrovascular etiology.  PLAN: 1.  MRI of brain and MRA of head and neck 2.  For migraine prevention, start Aimovig 70mg  monthly.  He is already on antihypertensive medications; he is unable to take topiramate due to history of recurrent kidney stones, and antidepressants such as nortriptyline and venlafaxine are contraindicated due to recent MI. 3.  For migraine rescue, start Ubrelvy.  He is on ASA,  failed Tylenol and triptans are contraindicated due to CAD with history of MI. 4.  Limit use of pain relievers to no more than 2 days out of week to prevent risk of rebound or medication-overuse headache. 5.  Keep headache diary 6.  Follow up in 6 months.  Thank you for allowing me to take part in the care of this patient.  Metta Clines, DO  CC: Roby Lofts, PA-C

## 2019-12-17 ENCOUNTER — Encounter: Payer: Self-pay | Admitting: Neurology

## 2019-12-17 ENCOUNTER — Ambulatory Visit (INDEPENDENT_AMBULATORY_CARE_PROVIDER_SITE_OTHER): Payer: BC Managed Care – PPO | Admitting: Neurology

## 2019-12-17 ENCOUNTER — Other Ambulatory Visit: Payer: Self-pay

## 2019-12-17 VITALS — BP 153/90 | HR 106 | Ht 66.0 in | Wt 200.4 lb

## 2019-12-17 DIAGNOSIS — H539 Unspecified visual disturbance: Secondary | ICD-10-CM

## 2019-12-17 DIAGNOSIS — G43109 Migraine with aura, not intractable, without status migrainosus: Secondary | ICD-10-CM

## 2019-12-17 DIAGNOSIS — R519 Headache, unspecified: Secondary | ICD-10-CM

## 2019-12-17 DIAGNOSIS — I2510 Atherosclerotic heart disease of native coronary artery without angina pectoris: Secondary | ICD-10-CM

## 2019-12-17 MED ORDER — AIMOVIG 70 MG/ML ~~LOC~~ SOAJ: 70.0000 mg | SUBCUTANEOUS | 11 refills | Status: DC

## 2019-12-17 MED ORDER — UBRELVY 100 MG PO TABS
1.0000 | ORAL_TABLET | ORAL | 11 refills | Status: DC | PRN
Start: 2019-12-17 — End: 2020-11-27

## 2019-12-17 NOTE — Progress Notes (Signed)
Stephen Avila (Key: [place]) Rx #: [id number] Stephen Avila 100MG  tablets   Form [facility] Electronic Prior Authorization Form (2017 NCPDP) Created 34 minutes ago Sent to Plan 16 minutes ago Plan Response 15 minutes ago Submit Clinical Questions less than a minute ago Determination Wait for Determination Please wait for [facility] 2017 NCPDP to return a determination.

## 2019-12-17 NOTE — Patient Instructions (Signed)
  1. We will check MRI of brain and MRA of head and neck 2. Start Aimovig 70mg  injection every 28 days 3. Take Ubrelvy 100mg  at earliest onset of headache.  May repeat dose once in 2 hours if needed.  Maximum 2 tablets in 24 hours. 4. Limit use of pain relievers to no more than 2 days out of the week.  These medications include acetaminophen, NSAIDs (ibuprofen/Advil/Motrin, naproxen/Aleve, triptans (Imitrex/sumatriptan), Excedrin, and narcotics.  This will help reduce risk of rebound headaches. 5. Be aware of common food triggers:  - Caffeine:  coffee, black tea, cola, Mt. Dew  - Chocolate  - Dairy:  aged cheeses (brie, blue, cheddar, gouda, [place], provolone, [place], Swiss, etc), chocolate milk, buttermilk, sour cream, limit eggs and yogurt  - Nuts, peanut butter  - Alcohol  - Cereals/grains:  FRESH breads (fresh bagels, sourdough, doughnuts), yeast productions  - Processed/canned/aged/cured meats (pre-packaged deli meats, hotdogs)  - MSG/glutamate:  soy sauce, flavor enhancer, pickled/preserved/marinated foods  - Sweeteners:  aspartame (Equal, Nutrasweet).  Sugar and Splenda are okay  - Vegetables:  legumes (lima beans, lentils, snow peas, fava beans, pinto peans, peas, garbanzo beans), sauerkraut, onions, olives, pickles  - Fruit:  avocados, bananas, citrus fruit (orange, lemon, grapefruit), mango  - Other:  Frozen meals, macaroni and cheese 6. Routine exercise 7. Stay adequately hydrated (aim for 64 oz water daily) 8. Keep headache diary 9. Maintain proper stress management 10. Maintain proper sleep hygiene 11. Do not skip meals 12. Consider supplements:  magnesium citrate 400mg  daily, riboflavin 400mg  daily, coenzyme Q10 100mg  three times daily.

## 2019-12-20 NOTE — Progress Notes (Addendum)
PA denied- appeal sent through [place] through 03/21/20.

## 2019-12-31 DIAGNOSIS — R61 Generalized hyperhidrosis: Secondary | ICD-10-CM | POA: Diagnosis not present

## 2019-12-31 DIAGNOSIS — R000 Tachycardia, unspecified: Secondary | ICD-10-CM | POA: Diagnosis not present

## 2020-01-12 ENCOUNTER — Encounter: Payer: Self-pay | Admitting: *Deleted

## 2020-01-12 ENCOUNTER — Telehealth: Payer: Self-pay | Admitting: *Deleted

## 2020-01-12 NOTE — Telephone Encounter (Signed)
[employer] we have on file to obtain prior auth for sleep study ordered. Per [employer] rep patient's policy is no longer active. Employer has canceled entire contract. Call reference # [id number]. Attempted to contact patient for new insurance information. No answer. Voice mail not set up. Could not leave a message. Will send note to contact office with current insurance information.

## 2020-01-12 NOTE — Telephone Encounter (Signed)
-----   Message from Jacqulynn Cadet, [place] sent at 11/19/2019  4:36 PM EDT ----- Regarding: split night sleep study Good afternoon,  Roby Lofts, PA-C saw this patient and ordered a split night study.   Thanks,   Ellison Carwin

## 2020-01-22 ENCOUNTER — Other Ambulatory Visit: Payer: BC Managed Care – PPO

## 2020-02-21 ENCOUNTER — Encounter: Payer: Self-pay | Admitting: Neurology

## 2020-02-21 NOTE — Progress Notes (Addendum)
Stephen Avila (Key: [place]) Stephen Avila 100MG  tablets   Form [facility] Electronic Prior Authorization Form (2017 NCPDP) Created 7 minutes ago Sent to Plan 6 minutes ago Plan Response 5 minutes ago Submit Clinical Questions 3 minutes ago Determination Favorable 1 minute ago Message from Plan Request Reference Number: [id number]. UBRELVY TAB 100MG  is approved through 02/20/2021. Your patient may now fill this prescription and it will be covered.

## 2020-02-28 ENCOUNTER — Encounter: Payer: Self-pay | Admitting: Cardiovascular Disease

## 2020-02-28 ENCOUNTER — Ambulatory Visit (INDEPENDENT_AMBULATORY_CARE_PROVIDER_SITE_OTHER): Payer: BC Managed Care – PPO | Admitting: Cardiovascular Disease

## 2020-02-28 ENCOUNTER — Other Ambulatory Visit: Payer: Self-pay

## 2020-02-28 DIAGNOSIS — I2121 ST elevation (STEMI) myocardial infarction involving left circumflex coronary artery: Secondary | ICD-10-CM

## 2020-02-28 DIAGNOSIS — K279 Peptic ulcer, site unspecified, unspecified as acute or chronic, without hemorrhage or perforation: Secondary | ICD-10-CM

## 2020-02-28 DIAGNOSIS — E785 Hyperlipidemia, unspecified: Secondary | ICD-10-CM | POA: Diagnosis not present

## 2020-02-28 DIAGNOSIS — G43909 Migraine, unspecified, not intractable, without status migrainosus: Secondary | ICD-10-CM | POA: Diagnosis not present

## 2020-02-28 DIAGNOSIS — I10 Essential (primary) hypertension: Secondary | ICD-10-CM

## 2020-02-28 DIAGNOSIS — I2510 Atherosclerotic heart disease of native coronary artery without angina pectoris: Secondary | ICD-10-CM | POA: Diagnosis not present

## 2020-02-28 MED ORDER — LOSARTAN POTASSIUM 50 MG PO TABS: 50.0000 mg | ORAL_TABLET | Freq: Every day | ORAL | 3 refills | Status: DC

## 2020-02-28 NOTE — Patient Instructions (Signed)
Medication Instructions:  INCREASE YOUR LOSARTAN TO 50MG   *If you need a refill on your cardiac medications before your next appointment, please call your pharmacy*   Lab Work: FASTING LABS: CMET CBC TSH LIPID  If you have labs (blood work) drawn today and your tests are completely normal, you will receive your results only by: Marland Kitchen MyChart Message (if you have MyChart) OR . A paper copy in the mail If you have any lab test that is abnormal or we need to change your treatment, we will call you to review the results.     Follow-Up: At [facility], you and your health needs are our priority.  As part of our continuing mission to provide you with exceptional heart care, we have created designated Provider Care Teams.  These Care Teams include your primary Cardiologist (physician) and Advanced Practice Providers (APPs -  Physician Assistants and Nurse Practitioners) who all work together to provide you with the care you need, when you need it.  We recommend signing up for the patient portal called "MyChart".  Sign up information is provided on this After Visit Summary.  MyChart is used to connect with patients for Virtual Visits (Telemedicine).  Patients are able to view lab/test results, encounter notes, upcoming appointments, etc.  Non-urgent messages can be sent to your provider as well.   To learn more about what you can do with MyChart, go to [email or web address].    Your next appointment:   3 month(s)  The format for your next appointment:   In Person  Provider:   Shelva Majestic, MD

## 2020-02-28 NOTE — Progress Notes (Signed)
 Cardiology Office Note    Date:  02/29/2020   ID:  Stephen Avila, DOB 03/11/1976, MRN [id number]  PCP:  Patient, No Pcp Per  Cardiologist:  Thomas Kelly, MD   Initial office visit with me following his STEMI  History of Present Illness:  Stephen Avila is a 44 y.o. male who has a history of hypertension, hyperlipidemia, peptic ulcer disease, and central hypopituitarism with significant sweating.  He was awakened from sleep the morning of Nov 07, 2019.  He was found to have 3 mm ST elevation inferolaterally upon arrival by EMS, a code STEMI was activated and he was brought to [place] where I performed emergent cardiac catheterization.  He was found to have total occlusion of a large OM 2 vessel and a left dominant circulation.  He had concomitant CAD with 70% OM1 stenosis and 70% proximal RCA stenosis.  He underwent successful intervention to the totally occluded OM 2 vessel with ultimate and insertion of a 3.0 x 22 mm Resolute stent.  Subsequent echo Doppler study demonstrated complete salvage of myocardium with an EF of 60 to 65% without wall motion abnormalities.  He was seen for initial post hospital evaluation on Nov 17, 2019 by Krista Kroeger, PA-C and was doing well.  Presently, Stephen Avila feels well.  He denies recurrent chest pain.  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  He admits to a long history of significant sweating and has undergone evaluations in the past.  He believes he is sleeping well.  Since his MI he has not had any episodes of chest tightness or shortness of breath.  He is unaware of palpitations.  In May LDL cholesterol was 104 upon presentation.  He has been on atorvastatin 80 mg ever since.  He was initially on aspirin and Brilinta but due to his GI bleed history aspirin was discontinued in June and he has been on Brilinta 90 mg twice a day ever since.  He presents for evaluation.   Past Medical History:  Diagnosis Date  . Anxiety   . Constipation   . GERD  (gastroesophageal reflux disease)   . Hypertension   . Kidney stones   . Kidney stones     Past Surgical History:  Procedure Laterality Date  . CHOLECYSTECTOMY N/A 06/26/2015   Procedure: LAPAROSCOPIC CHOLECYSTECTOMY;  Surgeon: Mark Jenkins, MD;  Location: [facility];  Service: General;  Laterality: N/A;  . COLONOSCOPY WITH PROPOFOL N/A 05/07/2019   Procedure: COLONOSCOPY WITH PROPOFOL;  Surgeon: Vanga, Rohini Reddy, MD;  Location: [facility]COPY;  Service: Gastroenterology;  Laterality: N/A;  . CORONARY/GRAFT ACUTE MI REVASCULARIZATION N/A 11/07/2019   Procedure: Coronary/Graft Acute MI Revascularization;  Surgeon: Kelly, Thomas A, MD;  Location: [facility];  Service: Cardiovascular;  Laterality: N/A;  . ESOPHAGOGASTRODUODENOSCOPY N/A 05/07/2019   Procedure: ESOPHAGOGASTRODUODENOSCOPY (EGD);  Surgeon: Vanga, Rohini Reddy, MD;  Location: [facility] ENDOSCOPY;  Service: Gastroenterology;  Laterality: N/A;  . HERNIA REPAIR     x 2   . LEFT HEART CATH AND CORONARY ANGIOGRAPHY N/A 11/07/2019   Procedure: LEFT HEART CATH AND CORONARY ANGIOGRAPHY;  Surgeon: Kelly, Thomas A, MD;  Location: [facility];  Service: Cardiovascular;  Laterality: N/A;  . LITHOTRIPSY      Current Medications: Outpatient Medications Prior to Visit  Medication Sig Dispense Refill  . acetaminophen (TYLENOL) 325 MG tablet Take 2 tablets (650 mg total) by mouth every 6 (six) hours as needed for mild pain (or Fever >/= 101).    .   amLODipine (NORVASC) 5 MG tablet Take 1 tablet (5 mg total) by mouth daily. 90 tablet 3  . atorvastatin (LIPITOR) 80 MG tablet Take 1 tablet (80 mg total) by mouth daily. 90 tablet 3  . buprenorphine (SUBUTEX) 8 MG SUBL SL tablet Place 20 mg under the tongue daily.     . carvedilol (COREG) 6.25 MG tablet Take 1 tablet (6.25 mg total) by mouth 2 (two) times daily with a meal. 180 tablet 3  . docusate sodium (COLACE) 100 MG capsule Take 1 capsule (100 mg total) by mouth 2 (two) times daily as  needed for mild constipation. 60 capsule 0  . Erenumab-aooe (AIMOVIG) 70 MG/ML SOAJ Inject 70 mg into the skin every 28 (twenty-eight) days. 1 pen 11  . ferrous sulfate 325 (65 FE) MG EC tablet Take 1 tablet (325 mg total) by mouth 2 (two) times daily. 60 tablet 3  . fluticasone (FLONASE) 50 MCG/ACT nasal spray Place 2 sprays into both nostrils daily. 16 g 0  . hydrOXYzine (ATARAX/VISTARIL) 25 MG tablet Take 25 mg by mouth every 6 (six) hours as needed for anxiety.     . linaclotide (LINZESS) 145 MCG CAPS capsule Take 145 mcg by mouth daily.    . nitroGLYCERIN (NITROSTAT) 0.4 MG SL tablet Place 1 tablet (0.4 mg total) under the tongue every 5 (five) minutes x 3 doses as needed for chest pain. 25 tablet 3  . pantoprazole (PROTONIX) 40 MG tablet Take 1 tablet (40 mg total) by mouth 2 (two) times daily. 180 tablet 3  . testosterone cypionate (DEPOTESTOSTERONE CYPIONATE) 200 MG/ML injection Inject 200 mg into the muscle every 14 (fourteen) days.     . ticagrelor (BRILINTA) 90 MG TABS tablet Take 1 tablet (90 mg total) by mouth 2 (two) times daily. 180 tablet 3  . Ubrogepant (UBRELVY) 100 MG TABS Take 1 tablet by mouth as needed (may repeat in 2 hours if needed.  Maximum 2 tablets in 24 hours.). 10 tablet 11  . vitamin B-12 1000 MCG tablet Take 1 tablet (1,000 mcg total) by mouth daily. 30 tablet 0  . aspirin 81 MG chewable tablet Chew 1 tablet (81 mg total) by mouth daily. 90 tablet 3  . losartan (COZAAR) 25 MG tablet Take 1 tablet (25 mg total) by mouth daily. 90 tablet 3   No facility-administered medications prior to visit.     Allergies:   Erythromycin   Social History   Socioeconomic History  . Marital status: Divorced    Spouse name: Not on file  . Number of children: Not on file  . Years of education: Not on file  . Highest education level: Not on file  Occupational History  . Not on file  Tobacco Use  . Smoking status: Never Smoker  . Smokeless tobacco: Current User    Types:  Snuff  Substance and Sexual Activity  . Alcohol use: No  . Drug use: No  . Sexual activity: Not on file  Other Topics Concern  . Not on file  Social History Narrative   Right handed   Lives alone   Social Determinants of Health   Financial Resource Strain: Low Risk   . Difficulty of Paying Living Expenses: Not hard at all  Food Insecurity: No Food Insecurity  . Worried About Running Out of Food in the Last Year: Never true  . Ran Out of Food in the Last Year: Never true  Transportation Needs: No Transportation Needs  . Lack of Transportation (Medical):   No  . Lack of Transportation (Non-Medical): No  Physical Activity: Sufficiently Active  . Days of Exercise per Week: 6 days  . Minutes of Exercise per Session: 150+ min  Stress: Stress Concern Present  . Feeling of Stress : Very much  Social Connections: Moderately Isolated  . Frequency of Communication with Friends and Family: More than three times a week  . Frequency of Social Gatherings with Friends and Family: Once a week  . Attends Religious Services: 1 to 4 times per year  . Active Member of Clubs or Organizations: No  . Attends Archivist Meetings: Never  . Marital Status: Divorced     Family History:  The patient's family history includes COPD in his mother; Heart failure in his father.   ROS General: Negative; No fevers, chills, or night sweats;  HEENT: Negative; No changes in vision or hearing, sinus congestion, difficulty swallowing Pulmonary: Negative; No cough, wheezing, shortness of breath, hemoptysis Cardiovascular: Negative; No chest pain, presyncope, syncope, palpitations GI: History of peptic ulcer disease GU: Negative; No dysuria, hematuria, or difficulty voiding Musculoskeletal: Negative; no myalgias, joint pain, or weakness Hematologic/Oncology: Negative; no easy bruising, bleeding Endocrine: Positive for significant sweating and history of possible central hypopituitarism Neuro: Negative;  no changes in balance, headaches Skin: Negative; No rashes or skin lesions Psychiatric: Buprenorphine dependency Sleep: Negative; No snoring, daytime sleepiness, hypersomnolence, bruxism, restless legs, hypnogognic hallucinations, no cataplexy Other comprehensive 14 point system review is negative.   PHYSICAL EXAM:   VS:  BP 140/86   Pulse 77   Ht 5' 6" (1.676 m)   Wt 199 lb 12.8 oz (90.6 kg)   SpO2 99%   BMI 32.25 kg/m     Repeat blood pressure was 126/80 by me  Wt Readings from Last 3 Encounters:  02/28/20 199 lb 12.8 oz (90.6 kg)  12/17/19 200 lb 6.4 oz (90.9 kg)  11/17/19 196 lb (88.9 kg)    General: Alert, oriented, no distress.  Skin: normal turgor, no rashes, warm and dry HEENT: Normocephalic, atraumatic. Pupils equal round and reactive to light; sclera anicteric; extraocular muscles intact;  Nose without nasal septal hypertrophy Mouth/Parynx benign; Mallinpatti scale 3 Neck: No JVD, no carotid bruits; normal carotid upstroke Lungs: clear to ausculatation and percussion; no wheezing or rales Chest wall: without tenderness to palpitation Heart: PMI not displaced, RRR, s1 s2 normal, 1/6 systolic murmur, no diastolic murmur, no rubs, gallops, thrills, or heaves Abdomen: soft, nontender; no hepatosplenomehaly, BS+; abdominal aorta nontender and not dilated by palpation. Back: no CVA tenderness Pulses 2+ Musculoskeletal: full range of motion, normal strength, no joint deformities Extremities: no clubbing cyanosis or edema, Homan's sign negative  Neurologic: grossly nonfocal; Cranial nerves grossly wnl Psychologic: Normal mood and affect   Studies/Labs Reviewed:   EKG:  EKG is ordered today.  ECG (independently read by me): NSR at 77, no ectopy, normal intervals  Recent Labs: BMP Latest Ref Rng & Units 11/17/2019 11/08/2019 11/07/2019  Glucose 65 - 99 mg/dL 90 113(H) 107(H)  BUN 6 - 24 mg/dL _0 Creatinine 0.76 - 1.27 mg/dL 1.01 1.08 0.98  BUN/Creat Ratio 9 - 20 9 -  -  Sodium 134 - 144 mmol/L 139 142 137  Potassium 3.5 - 5.2 mmol/L 4.5 3.9 4.2  Chloride 96 - 106 mmol/L 102 110 105  CO2 20 - 29 mmol/L 26 29 21(L)  Calcium 8.7 - 10.2 mg/dL 9.2 8.6(L) 8.7(L)     Hepatic Function Latest Ref Rng & Units  11/07/2019 05/05/2019 06/21/2015  Total Protein 6.5 - 8.1 g/dL 7.5 4.9(L) 7.4  Albumin 3.5 - 5.0 g/dL 4.2 2.7(L) 4.2  AST 15 - 41 U/L 29 19 30  ALT 0 - 44 U/L 24 18 27  Alk Phosphatase 38 - 126 U/L 70 37(L) 71  Total Bilirubin 0.3 - 1.2 mg/dL 0.8 0.5 0.5  Bilirubin, Direct 0.0 - 0.2 mg/dL - <0.1 0.2    CBC Latest Ref Rng & Units 11/08/2019 11/07/2019 11/07/2019  WBC 4.0 - 10.5 K/uL 5.9 7.3 8.0  Hemoglobin 13.0 - 17.0 g/dL 10.5(L) 11.5(L) 12.3(L)  Hematocrit 39 - 52 % 33.8(L) 36.8(L) 39.3  Platelets 150 - 400 K/uL 329 364 448(H)   Lab Results  Component Value Date   MCV 88.9 11/08/2019   MCV 86.2 11/07/2019   MCV 87.3 11/07/2019   Lab Results  Component Value Date   TSH 0.033 (L) 05/06/2019   Lab Results  Component Value Date   HGBA1C 4.6 (L) 11/07/2019     BNP    Component Value Date/Time   BNP 18.6 11/07/2019 0635    ProBNP No results found for: PROBNP   Lipid Panel     Component Value Date/Time   CHOL 177 11/07/2019 0304   TRIG 168 (H) 11/07/2019 0304   HDL 39 (L) 11/07/2019 0304   CHOLHDL 4.5 11/07/2019 0304   VLDL 34 11/07/2019 0304   LDLCALC 104 (H) 11/07/2019 0304     RADIOLOGY: No results found.  EMERGENT CATH/PCI: 11/07/2019  Additional studies/ records that were reviewed today include:    Prox RCA lesion is 70% stenosed.  1st Mrg lesion is 70% stenosed.  2nd Mrg lesion is 100% stenosed.   Acute ST segment elevation myocardial infarction secondary to total occlusion of a large OM 2 vessel in a left dominant circulation.  The left main is very short and immediately bifurcates into a normal large LAD vessel without significant obstruction; circumflex vessel gives rise to a high marginal OM1 which has a ramus  distribution and has diffuse 70% proximal narrowing, the OM 2 vessel is totally occluded with TIMI 0 flow and the distal circumflex supplies the inferolateral and posterior lateral wall; small nondominant RCA with diffuse 60-70% proximal to mid irregularity/stenosis.  Preserved global LV contractility with EF estimated at 55% with possible subtle focal inferior hypocontractility.  Successful PCI of the OM 2 vessel with PTCA and ultimate stenting with a 3.0 x 22 mm Resolute DES stent dilated to 3.1 mm with 100% occlusion being reduced to 0% and resumption of brisk TIMI-3 flow.  RECOMMENDATION:  DAPT for minimum of 12 months.  Initial adequate therapy for concomitant CAD of his large high marginal branch and small RCA vessel.  Initiate atorvastatin 80 mg for aggressive lipid-lowering therapy and attempt to induce plaque regression with target LDL less than 70 and preferably in the fifties or below.  Optimal blood pressure control.    Echocardiogram 11/07/19: 1. Normal LV systolic function; mild LVH; grade 2 diastolic dysfunction;  mild LAE.  2. Left ventricular ejection fraction, by estimation, is 60 to 65%. The  left ventricle has normal function. The left ventricle has no regional  wall motion abnormalities. There is mild left ventricular hypertrophy.  Left ventricular diastolic parameters  are consistent with Grade II diastolic dysfunction (pseudonormalization).  3. Right ventricular systolic function is normal. The right ventricular  size is normal.  4. Left atrial size was mildly dilated.  5. The mitral valve is normal in structure. Trivial mitral   valve  regurgitation. No evidence of mitral stenosis.  6. The aortic valve is tricuspid. Aortic valve regurgitation is not  visualized. Mild aortic valve sclerosis is present, with no evidence of  aortic valve stenosis.  7. The inferior vena cava is normal in size with greater than 50%  respiratory variability, suggesting right atrial  pressure of 3 mmHg.      ASSESSMENT:    1. 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 Coronary artery disease involving native coronary artery of native heart without angina pectoris   3. Essential hypertension   4. Hyperlipidemia with target LDL less than 70   5. Migraine without status migrainosus, not intractable, unspecified migraine type   6. PUD (peptic ulcer disease)     PLAN:  Mr. Barry Mateo is a 44-year-old gentleman who developed new onset chest pain the morning of Nov 07, 2019 and was found to have inferolateral ST segment elevation myocardial infarction secondary to total occlusion of a large OM 2 vessel of the dominant circumflex coronary artery.. He underwent successful intervention with insertion of a Resolute 3.0 x 22 mm DES stent with the 100% occlusion being reduced to 0% and TIMI 0 flow improving to TIMI-3 flow.  A subsequent echo Doppler study has shown normalization of LV function with essentially complete salvage of myocardium.  He has been without recurrent anginal symptoms since his initial event.  Due to concomitant CAD and his history of hypertension he is now also on amlodipine 5 mg, carvedilol 6.25 mg twice a day, and losartan 25 mg daily. Blood pressure today was initially elevated at 140/8086 but this improved on repeat evaluation by me.  He is now on Brilinta 90 mg twice a day alone after being on aspirin for several weeks which was discontinued due to his previous GI blood loss history from ulcer disease.  His initial LDL on MI presentation was 104 and he has been on atorvastatin 80 mg ever since and tolerating this well.  With some blood pressure lability, I have suggested slight titration of ARB therapy and will increase his losartan to 50 mg daily.  I have recommended follow-up laboratory with a fasting CMP, lipid panel, in addition to CBC and TSH level.  Apparently in October 2020  his TSH was over suppressed at 0.033.  BMI is mildly increased at 32.25 consistent with mild obesity.  Weight loss and increased activity was recommended.  He has seen neurology for possible migraine headaches and was started on Aimovig injection monthly.  However this was denied by his insurance and he therefore is not on treatment.  I will see him in 3 months for reevaluation or sooner as needed.   Medication Adjustments/Labs and Tests Ordered: Current medicines are reviewed at length with the patient today.  Concerns regarding medicines are outlined above.  Medication changes, Labs and Tests ordered today are listed in the Patient Instructions below. Patient Instructions  Medication Instructions:  INCREASE YOUR LOSARTAN TO 50MG  *If you need a refill on your cardiac medications before your next appointment, please call your pharmacy*   Lab Work: FASTING LABS: CMET CBC TSH LIPID  If you have labs (blood work) drawn today and your tests are completely normal, you will receive your results only by: . MyChart Message (if you have MyChart) OR . A paper copy in the mail If you have any lab test that is abnormal or we need to change   your treatment, we will call you to review the results.     Follow-Up: At [facility], you and your health needs are our priority.  As part of our continuing mission to provide you with exceptional heart care, we have created designated Provider Care Teams.  These Care Teams include your primary Cardiologist (physician) and Advanced Practice Providers (APPs -  Physician Assistants and Nurse Practitioners) who all work together to provide you with the care you need, when you need it.  We recommend signing up for the patient portal called "MyChart".  Sign up information is provided on this After Visit Summary.  MyChart is used to connect with patients for Virtual Visits (Telemedicine).  Patients are able to view lab/test results, encounter notes, upcoming  appointments, etc.  Non-urgent messages can be sent to your provider as well.   To learn more about what you can do with MyChart, go to [email or web address].    Your next appointment:   3 month(s)  The format for your next appointment:   In Person  Provider:   Thomas Kelly, MD        Signed, Thomas Kelly, MD  02/29/2020 4:38 PM    [facility] [address], [address], [place],   [address] Phone: [phone]    

## 2020-02-29 ENCOUNTER — Encounter: Payer: Self-pay | Admitting: Cardiovascular Disease

## 2020-04-24 ENCOUNTER — Telehealth: Payer: Self-pay | Admitting: Cardiovascular Disease

## 2020-04-24 NOTE — Telephone Encounter (Signed)
Returned call to patient left message on personal voice mail I will send message to RN working with Dr.Kelly today to make her aware.

## 2020-04-24 NOTE — Telephone Encounter (Signed)
Patient returning call. He wants to see if Dr. Claiborne Billings received his fax - and he said he wants a call whenever it is ready and he can pick it up.   Please call/advise.   Thank you!

## 2020-04-24 NOTE — Telephone Encounter (Signed)
Patient calling because he is faxing over a paper from his work stating that he did not meet his BMI goal. He needs Dr. Claiborne Billings to fill out one of the boxes on the paper with a suggested diet. He is faxing it over today.

## 2020-04-25 NOTE — Telephone Encounter (Signed)
Informed pt who have received paperwork and will have MD review. Pt verbalized understanding and noted he would like a call when ready for pick up.

## 2020-04-25 NOTE — Telephone Encounter (Signed)
° °  Pt is calling back to follow up, he said he just need to know if Dr. Claiborne Avila received the paperwork he also said he have a deadline to submit as well. He would like to receive a callback

## 2020-04-27 NOTE — Telephone Encounter (Signed)
Returned call to patient's girlfriend (DPR on file). She is asking if Dr. Claiborne Billings has completed the paperwork. She states that it has to be turned in today. Made her aware that Dr. Claiborne Billings and his RN are both out of the office today, but I will ask another onsite RN to take a look to see if this has been completed.

## 2020-04-27 NOTE — Telephone Encounter (Signed)
Follow Up:     [name] calling to find out if paperwork is ready?  She said if possible could she pick it up today before 5:00 please. She said must have this today please.Marland Kitchen

## 2020-04-27 NOTE — Telephone Encounter (Signed)
Follow up:    Patient girlfriend calling stating that the patient need paper work sent in by 04/28/20.patient work off last week. Patient need it ASAP for work by or on 04/28/20. Please call [phone]

## 2020-04-27 NOTE — Telephone Encounter (Signed)
Spoke with the patient's girlfriend. She has been made aware that the paperwork has not been signed yet and that Dr. Claiborne Billings was not in the office. She stated that he needed the paperwork signed by tomorrow afternoon and that she could come pick it up. If it is not signed then the patient has to pay $100 every week until it is signed.

## 2020-04-28 ENCOUNTER — Telehealth: Payer: Self-pay

## 2020-04-28 NOTE — Telephone Encounter (Signed)
Paperwork completed and placed up front for pick up. Girlfriend called and made aware by clinical supervisor.

## 2020-04-28 NOTE — Telephone Encounter (Signed)
Contacted by pt girlfriend, [name] from [facility], as pt needs paperwork signed by provider by 5pm today (10/22) for insurance.  Provider addressed concerns in his last office note. Paper work signed, placed at [employer], and [place] aware left up front and when office loses. No additional questions at this time.   Encouaged setting up MyChart for ease of access to records.

## 2020-04-28 NOTE — Telephone Encounter (Signed)
Patient's girlfriends calling back. She states she needs to have the paper work by 5:00 pm. She would like it to be ready by 11:00 am to be able to pick it up during lunch.

## 2020-05-08 NOTE — Progress Notes (Deleted)
NEUROLOGY FOLLOW UP OFFICE NOTE  Stephen Avila [id number]  HISTORY OF PRESENT ILLNESS: Stephen Avila is a 44 year old right-handed Caucasian male with CAD s/p STEMI with PCI/DES to OM2, HTN, HLD, and central hypopituitarism who follows up for migraine.  UPDATE: MRI of brain and MRA head and neck ordered in June but never performed.  *** Started Aimovig in June. Intensity:  *** Duration:  *** Frequency:  *** Frequency of abortive medication: *** Current NSAIDS:  ASA 81mg  daily Current analgesics:  acetaminophen Current triptans:  none Current ergotamine:  none Current anti-emetic:  none Current muscle relaxants:  none Current anti-anxiolytic:  hydroxyzine Current sleep aide:  none Current Antihypertensive medications:  Losartan, Coreg, amlodipine Current Antidepressant medications:  none Current Anticonvulsant medications:  none Current anti-CGRP:  Aimovig 70mg  every 28 days, Ubrelvy Current Vitamins/Herbal/Supplements:  B12, ferrous sulfate Current Antihistamines/Decongestants:  none Other therapy:  none Hormone/birth control:  testosterone Other medications:  Buprenorphine, NTG, Brilinta, atorvastatin  Caffeine:  *** Alcohol:  *** Smoker:  *** Diet:  *** Exercise:  *** Depression:  ***; Anxiety:  *** Other pain:  *** Sleep hygiene:  ***   HISTORY: He was admitted to [facility] on 11/07/2019 for STEMI and underwent PCI/DES for occluded large OM2 vessel.  Following discharge, he started experiencing new headaches.  He would see a bright light followed by cloudy vision in his left eye for 30 minutes followed by onset of 7-8/10 pain on right top of his head lasting all day.  Some associated nausea and photophobia.  On a couple of occasions, he had tingling on the left side of his lip and tongue for a few seconds.  He has taken Tylenol, which is ineffective.  No specific triggers.  Laying down to rest helps.  He reports that he had one severe migraine many  years ago.  Otherwise, he has had mild infrequent headaches consistent with tension-type headache.  His mother had migraines.  MRI of brain and pituitary gland with and without contrast on 10/04/2015 personally reviewed was normal.  Past NSAIDS:  none Past analgesics:  none Past abortive triptans:  none Past abortive ergotamine:  none Past muscle relaxants:  none Past anti-emetic:  none Past antihypertensive medications:  none Past antidepressant medications:  none Past anticonvulsant medications:  none Past anti-CGRP:  none  PAST MEDICAL HISTORY: Past Medical History:  Diagnosis Date  . Anxiety   . Constipation   . GERD (gastroesophageal reflux disease)   . Hypertension   . Kidney stones   . Kidney stones     MEDICATIONS: Current Outpatient Medications on File Prior to Visit  Medication Sig Dispense Refill  . acetaminophen (TYLENOL) 325 MG tablet Take 2 tablets (650 mg total) by mouth every 6 (six) hours as needed for mild pain (or Fever >/= 101).    Marland Kitchen amLODipine (NORVASC) 5 MG tablet Take 1 tablet (5 mg total) by mouth daily. 90 tablet 3  . atorvastatin (LIPITOR) 80 MG tablet Take 1 tablet (80 mg total) by mouth daily. 90 tablet 3  . buprenorphine (SUBUTEX) 8 MG SUBL SL tablet Place 20 mg under the tongue daily.     . carvedilol (COREG) 6.25 MG tablet Take 1 tablet (6.25 mg total) by mouth 2 (two) times daily with a meal. 180 tablet 3  . docusate sodium (COLACE) 100 MG capsule Take 1 capsule (100 mg total) by mouth 2 (two) times daily as needed for mild constipation. 60 capsule 0  . Stephen Avila (  AIMOVIG) 70 MG/ML SOAJ Inject 70 mg into the skin every 28 (twenty-eight) days. 1 pen 11  . ferrous sulfate 325 (65 FE) MG EC tablet Take 1 tablet (325 mg total) by mouth 2 (two) times daily. 60 tablet 3  . fluticasone (FLONASE) 50 MCG/ACT nasal spray Place 2 sprays into both nostrils daily. 16 g 0  . hydrOXYzine (ATARAX/VISTARIL) 25 MG tablet Take 25 mg by mouth every 6 (six) hours  as needed for anxiety.     Marland Kitchen linaclotide (LINZESS) 145 MCG CAPS capsule Take 145 mcg by mouth daily.    Marland Kitchen losartan (COZAAR) 50 MG tablet Take 1 tablet (50 mg total) by mouth daily. 90 tablet 3  . nitroGLYCERIN (NITROSTAT) 0.4 MG SL tablet Place 1 tablet (0.4 mg total) under the tongue every 5 (five) minutes x 3 doses as needed for chest pain. 25 tablet 3  . pantoprazole (PROTONIX) 40 MG tablet Take 1 tablet (40 mg total) by mouth 2 (two) times daily. 180 tablet 3  . testosterone cypionate (DEPOTESTOSTERONE CYPIONATE) 200 MG/ML injection Inject 200 mg into the muscle every 14 (fourteen) days.     . ticagrelor (BRILINTA) 90 MG TABS tablet Take 1 tablet (90 mg total) by mouth 2 (two) times daily. 180 tablet 3  . Ubrogepant (UBRELVY) 100 MG TABS Take 1 tablet by mouth as needed (may repeat in 2 hours if needed.  Maximum 2 tablets in 24 hours.). 10 tablet 11  . vitamin B-12 1000 MCG tablet Take 1 tablet (1,000 mcg total) by mouth daily. 30 tablet 0   No current facility-administered medications on file prior to visit.    ALLERGIES: Allergies  Allergen Reactions  . Erythromycin     Made chest feel "funny"    FAMILY HISTORY: Family History  Problem Relation Age of Onset  . COPD Mother   . Heart failure Father   . Thyroid disease Neg Hx     SOCIAL HISTORY: Social History   Socioeconomic History  . Marital status: Divorced    Spouse name: Not on file  . Number of children: Not on file  . Years of education: Not on file  . Highest education level: Not on file  Occupational History  . Not on file  Tobacco Use  . Smoking status: Never Smoker  . Smokeless tobacco: Current User    Types: Snuff  Substance and Sexual Activity  . Alcohol use: No  . Drug use: No  . Sexual activity: Not on file  Other Topics Concern  . Not on file  Social History Narrative   Right handed   Lives alone   Social Determinants of Health   Financial Resource Strain:   . Difficulty of Paying Living  Expenses: Not on file  Food Insecurity:   . Worried About Charity fundraiser in the Last Year: Not on file  . Ran Out of Food in the Last Year: Not on file  Transportation Needs:   . Lack of Transportation (Medical): Not on file  . Lack of Transportation (Non-Medical): Not on file  Physical Activity:   . Days of Exercise per Week: Not on file  . Minutes of Exercise per Session: Not on file  Stress:   . Feeling of Stress : Not on file  Social Connections:   . Frequency of Communication with Friends and Family: Not on file  . Frequency of Social Gatherings with Friends and Family: Not on file  . Attends Religious Services: Not on file  .  Active Member of Clubs or Organizations: Not on file  . Attends Archivist Meetings: Not on file  . Marital Status: Not on file  Intimate Partner Violence:   . Fear of Current or Ex-Partner: Not on file  . Emotionally Abused: Not on file  . Physically Abused: Not on file  . Sexually Abused: Not on file    PHYSICAL EXAM: *** General: No acute distress.  Patient appears well-groomed.   Head:  Normocephalic/atraumatic Eyes:  Fundi examined but not visualized Neck: supple, no paraspinal tenderness, full range of motion Heart:  Regular rate and rhythm Lungs:  Clear to auscultation bilaterally Back: No paraspinal tenderness Neurological Exam: alert and oriented to person, place, and time. Attention span and concentration intact, recent and remote memory intact, fund of knowledge intact.  Speech fluent and not dysarthric, language intact.  CN II-XII intact. Bulk and tone normal, muscle strength 5/5 throughout.  Sensation to light touch, temperature and vibration intact.  Deep tendon reflexes 2+ throughout, toes downgoing.  Finger to nose and heel to shin testing intact.  Gait normal, Romberg negative.  IMPRESSION: Migraine with aura, without status migrainosus, not intractable  PLAN: ***  Metta Clines, DO

## 2020-05-09 ENCOUNTER — Ambulatory Visit: Payer: BC Managed Care – PPO | Admitting: Neurology

## 2020-05-09 ENCOUNTER — Encounter: Payer: Self-pay | Admitting: Neurology

## 2020-05-09 DIAGNOSIS — Z029 Encounter for administrative examinations, unspecified: Secondary | ICD-10-CM

## 2020-05-15 DIAGNOSIS — M4716 Other spondylosis with myelopathy, lumbar region: Secondary | ICD-10-CM | POA: Diagnosis not present

## 2020-06-05 ENCOUNTER — Other Ambulatory Visit: Payer: Self-pay

## 2020-06-05 ENCOUNTER — Ambulatory Visit (INDEPENDENT_AMBULATORY_CARE_PROVIDER_SITE_OTHER): Payer: BC Managed Care – PPO | Admitting: Cardiovascular Disease

## 2020-06-05 ENCOUNTER — Encounter: Payer: Self-pay | Admitting: Cardiovascular Disease

## 2020-06-05 DIAGNOSIS — I2510 Atherosclerotic heart disease of native coronary artery without angina pectoris: Secondary | ICD-10-CM | POA: Diagnosis not present

## 2020-06-05 DIAGNOSIS — R000 Tachycardia, unspecified: Secondary | ICD-10-CM

## 2020-06-05 DIAGNOSIS — I2121 ST elevation (STEMI) myocardial infarction involving left circumflex coronary artery: Secondary | ICD-10-CM

## 2020-06-05 DIAGNOSIS — E785 Hyperlipidemia, unspecified: Secondary | ICD-10-CM | POA: Diagnosis not present

## 2020-06-05 DIAGNOSIS — E291 Testicular hypofunction: Secondary | ICD-10-CM

## 2020-06-05 DIAGNOSIS — I10 Essential (primary) hypertension: Secondary | ICD-10-CM

## 2020-06-05 MED ORDER — CARVEDILOL 6.25 MG PO TABS: 12.5000 mg | ORAL_TABLET | Freq: Two times a day (BID) | ORAL | 3 refills | Status: DC

## 2020-06-05 NOTE — Patient Instructions (Signed)
Medication Instructions:  Resume your amlodipine starting this evening Increase your carvedilol (Coreg) to 12.5 mg (2 pills) twice a day. In 3 days, if your heart rate is still 90 beats per minute or above, increase your carvedilol to 18.75 mg (3 pills) twice a day. *If you need a refill on your cardiac medications before your next appointment, please call your pharmacy*   Lab Work: CMET, lipid profile, CBC, TSH, Free T3 and Free T4 If you have labs (blood work) drawn today and your tests are completely normal, you will receive your results only by: Marland Kitchen MyChart Message (if you have MyChart) OR . A paper copy in the mail If you have any lab test that is abnormal or we need to change your treatment, we will call you to review the results.   Testing/Procedures: None ordered   Follow-Up: At [facility], you and your health needs are our priority.  As part of our continuing mission to provide you with exceptional heart care, we have created designated Provider Care Teams.  These Care Teams include your primary Cardiologist (physician) and Advanced Practice Providers (APPs -  Physician Assistants and Nurse Practitioners) who all work together to provide you with the care you need, when you need it.  We recommend signing up for the patient portal called "MyChart".  Sign up information is provided on this After Visit Summary.  MyChart is used to connect with patients for Virtual Visits (Telemedicine).  Patients are able to view lab/test results, encounter notes, upcoming appointments, etc.  Non-urgent messages can be sent to your provider as well.   To learn more about what you can do with MyChart, go to [email or web address].    Your next appointment:   6-8 weeks  The format for your next appointment:   In Person  Provider:   Shelva Majestic, MD   Other Instructions None

## 2020-06-05 NOTE — Progress Notes (Signed)
Cardiology Office Note    Date:  06/07/2020   ID:  Stephen Avila, DOB 27-Oct-1975, MRN [id number]  PCP:  Pcp, No  Cardiologist:  Shelva Majestic, MD   F/U office visit with me following his STEMI  History of Present Illness:  Stephen Avila is a 44 y.o. male who presents for a 50-monthfollow-up cardiology evaluation.  Mr. Stephen Hidalgohas a history of hypertension, hyperlipidemia, peptic ulcer disease, and central hypopituitarism with significant sweating.  He was awakened from sleep the morning of Nov 07, 2019.  He was found to have 3 mm ST elevation inferolaterally upon arrival by EMS, a code STEMI was activated [facility].  He was found to have total occlusion of a large OM 2 vessel and a left dominant circulation.  He had concomitant CAD with 70% OM1 stenosis and 70% proximal RCA stenosis.  He underwent successful intervention to the totally occluded OM 2 vessel with ultimate and insertion of a 3.0 x 22 mm Resolute stent.  Subsequent echo Doppler study demonstrated complete salvage of myocardium with an EF of 60 to 65% without wall motion abnormalities.  He was seen for initial post hospital evaluation on Nov 17, 2019 by Stephen Lofts PA-C and was doing well.  I saw him for my initial office evaluation in February 28, 2020.  At that time he felt well and denied any  recurrent chest pain.  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  He believes he is sleeping well.  Since his MI he has not had any episodes of chest tightness or shortness of breath.  He is unaware of palpitations.  In May LDL cholesterol was 104 upon presentation.  He has been on atorvastatin 80 mg ever since.  He was initially on aspirin and Brilinta but due to his GI bleed history aspirin was discontinued in June and he has been on Brilinta 90 mg twice a day ever since.   During that evaluation, his blood pressure was mildly elevated and I recommended titration of losartan from 25 up to 50 mg daily.  He was also on amlodipine 5 mg daily and carvedilol 6.25 mg twice a day.  He has a history of hypergonadism and has been on testosterone injections every 2 weeks for at least 6 years.  2 weeks ago he did an injection in his thigh and did notice significant swelling at the injection site and some residual soreness which has persisted.  He ran out of his amlodipine 5 days ago.  He has noticed his heart rate has increased but denies any chest tightness or palpitations.  He has not had recent laboratory.  He presents for evaluation.   Past Medical History:  Diagnosis Date  . Anxiety   . Constipation   . GERD (gastroesophageal reflux disease)   . Hypertension   . Kidney stones   . Kidney stones     Past Surgical History:  Procedure Laterality Date  . CHOLECYSTECTOMY N/A 06/26/2015   Procedure: LAPAROSCOPIC CHOLECYSTECTOMY;  Surgeon: MAviva Signs MD;  Location: [facility];  Service: General;  Laterality: N/A;  . COLONOSCOPY WITH PROPOFOL N/A 05/07/2019   Procedure: COLONOSCOPY WITH PROPOFOL;  Surgeon: VLin Landsman MD;  [facility];  Service: Gastroenterology;  Laterality: N/A;  . CORONARY/GRAFT ACUTE MI REVASCULARIZATION N/A 11/07/2019   Procedure: Coronary/Graft Acute  MI Revascularization;  Surgeon: [name], MD;  Location: [place] [facility];  Service: Cardiovascular;  Laterality: N/A;  . ESOPHAGOGASTRODUODENOSCOPY N/A 05/07/2019   Procedure: ESOPHAGOGASTRODUODENOSCOPY (EGD);  Surgeon: [name], MD;  Location: [facility] ENDOSCOPY;  Service: Gastroenterology;  Laterality: N/A;  . HERNIA REPAIR     x 2   . LEFT HEART CATH AND CORONARY ANGIOGRAPHY N/A 11/07/2019   Procedure: LEFT HEART CATH AND CORONARY ANGIOGRAPHY;  Surgeon: [name], MD;  Location: [place] [facility];  Service: Cardiovascular;  Laterality: N/A;  . LITHOTRIPSY       Current Medications: Outpatient Medications Prior to Visit  Medication Sig Dispense Refill  . acetaminophen (TYLENOL) 325 MG tablet Take 2 tablets (650 mg total) by mouth every 6 (six) hours as needed for mild pain (or Fever >/= 101).    Marland Kitchen amLODipine (NORVASC) 5 MG tablet Take 1 tablet (5 mg total) by mouth daily. 90 tablet 3  . amphetamine-dextroamphetamine (ADDERALL) 10 MG tablet Take 10 mg by mouth as needed.    Marland Kitchen atorvastatin (LIPITOR) 80 MG tablet Take 1 tablet (80 mg total) by mouth daily. 90 tablet 3  . buprenorphine (SUBUTEX) 8 MG SUBL SL tablet Place 20 mg under the tongue daily.     Marland Kitchen docusate sodium (COLACE) 100 MG capsule Take 1 capsule (100 mg total) by mouth 2 (two) times daily as needed for mild constipation. 60 capsule 0  . hydrOXYzine (ATARAX/VISTARIL) 25 MG tablet Take 25 mg by mouth every 6 (six) hours as needed for anxiety.     Marland Kitchen losartan (COZAAR) 50 MG tablet Take 1 tablet (50 mg total) by mouth daily. 90 tablet 3  . nitroGLYCERIN (NITROSTAT) 0.4 MG SL tablet Place 1 tablet (0.4 mg total) under the tongue every 5 (five) minutes x 3 doses as needed for chest pain. 25 tablet 3  . pantoprazole (PROTONIX) 40 MG tablet Take 1 tablet (40 mg total) by mouth 2 (two) times daily. 180 tablet 3  . testosterone cypionate (DEPOTESTOSTERONE CYPIONATE) 200 MG/ML injection Inject 200 mg into the muscle every 14 (fourteen) days.     . ticagrelor (BRILINTA) 90 MG TABS tablet Take 1 tablet (90 mg total) by mouth 2 (two) times daily. 180 tablet 3  . Ubrogepant (UBRELVY) 100 MG TABS Take 1 tablet by mouth as needed (may repeat in 2 hours if needed.  Maximum 2 tablets in 24 hours.). 10 tablet 11  . vitamin B-12 1000 MCG tablet Take 1 tablet (1,000 mcg total) by mouth daily. 30 tablet 0  . carvedilol (COREG) 6.25 MG tablet Take 1 tablet (6.25 mg total) by mouth 2 (two) times daily with a meal. 180 tablet 3  . Erenumab-aooe (AIMOVIG) 70 MG/ML SOAJ Inject 70 mg into the skin every 28  (twenty-eight) days. 1 pen 11  . linaclotide (LINZESS) 145 MCG CAPS capsule Take 145 mcg by mouth daily.    . ferrous sulfate 325 (65 FE) MG EC tablet Take 1 tablet (325 mg total) by mouth 2 (two) times daily. 60 tablet 3  . fluticasone (FLONASE) 50 MCG/ACT nasal spray Place 2 sprays into both nostrils daily. 16 g 0   No facility-administered medications prior to visit.     Allergies:   Erythromycin   Social History   Socioeconomic History  . Marital status: Divorced    Spouse name: Not on file  . Number of children: Not on file  . Years of education: Not on file  . Highest education level:  Not on file  Occupational History  . Not on file  Tobacco Use  . Smoking status: Never Smoker  . Smokeless tobacco: Current User    Types: Snuff  Substance and Sexual Activity  . Alcohol use: No  . Drug use: No  . Sexual activity: Not on file  Other Topics Concern  . Not on file  Social History Narrative   Right handed   Lives alone   Social Determinants of Health   Financial Resource Strain:   . Difficulty of Paying Living Expenses: Not on file  Food Insecurity:   . Worried About Charity fundraiser in the Last Year: Not on file  . Ran Out of Food in the Last Year: Not on file  Transportation Needs:   . Lack of Transportation (Medical): Not on file  . Lack of Transportation (Non-Medical): Not on file  Physical Activity:   . Days of Exercise per Week: Not on file  . Minutes of Exercise per Session: Not on file  Stress:   . Feeling of Stress : Not on file  Social Connections:   . Frequency of Communication with Friends and Family: Not on file  . Frequency of Social Gatherings with Friends and Family: Not on file  . Attends Religious Services: Not on file  . Active Member of Clubs or Organizations: Not on file  . Attends Archivist Meetings: Not on file  . Marital Status: Not on file     Family History:  The patient's family history includes COPD in his mother;  Heart failure in his father.   ROS General: Negative; No fevers, chills, or night sweats;  HEENT: Negative; No changes in vision or hearing, sinus congestion, difficulty swallowing Pulmonary: Negative; No cough, wheezing, shortness of breath, hemoptysis Cardiovascular: Negative; No chest pain, presyncope, syncope, palpitations GI: History of peptic ulcer disease GU: Negative; No dysuria, hematuria, or difficulty voiding Musculoskeletal: Negative; no myalgias, joint pain, or weakness Hematologic/Oncology: Negative; no easy bruising, bleeding Endocrine: Positive for significant sweating and history of possible central hypopituitarism Neuro: Negative; no changes in balance, headaches Skin: Negative; No rashes or skin lesions Psychiatric: Buprenorphine dependency Sleep: Negative; No snoring, daytime sleepiness, hypersomnolence, bruxism, restless legs, hypnogognic hallucinations, no cataplexy Other comprehensive 14 point system review is negative.   PHYSICAL EXAM:   VS:  BP (!) 162/98   Pulse (!) 120   Ht _0  (1.676 m)   Wt 187 lb 12.8 oz (85.2 kg)   BMI 30.31 kg/m     Repeat blood pressure by me was 140/82  Wt Readings from Last 3 Encounters:  06/05/20 187 lb 12.8 oz (85.2 kg)  02/28/20 199 lb 12.8 oz (90.6 kg)  12/17/19 200 lb 6.4 oz (90.9 kg)    General: Alert, oriented, no distress.  Skin: normal turgor, no rashes, warm and dry HEENT: Normocephalic, atraumatic. Pupils equal round and reactive to light; sclera anicteric; extraocular muscles intact; Nose without nasal septal hypertrophy Mouth/Parynx benign; Mallinpatti scale 3 Neck: No JVD, no carotid bruits; normal carotid upstroke Lungs: clear to ausculatation and percussion; no wheezing or rales Chest wall: without tenderness to palpitation Heart: PMI not displaced, regular rhythm but tachycardic, s1 s2 normal, 1/6 systolic murmur, no diastolic murmur, no rubs, gallops, thrills, or heaves Abdomen: soft, nontender; no  hepatosplenomehaly, BS+; abdominal aorta nontender and not dilated by palpation. Back: no CVA tenderness Pulses 2+ Musculoskeletal: full range of motion, normal strength, no joint deformities Extremities: Mild air review of residual soreness at  his most recent testosterone injection site with some firmness; no clubbing cyanosis or edema, Homan's sign negative  Neurologic: grossly nonfocal; Cranial nerves grossly wnl Psychologic: Normal mood and affect  Studies/Labs Reviewed:   EKG:  EKG is ordered today.  ECG (independently read by me): Sinus tachycardia at 120, small inferior Q waves  August 23, 2021ECG (independently read by me): NSR at 77, no ectopy, normal intervals  Recent Labs: BMP Latest Ref Rng & Units 06/06/2020 11/17/2019 11/08/2019  Glucose 65 - 99 mg/dL 95 90 113(H)  BUN 6 - 24 mg/dL _0 Creatinine 0.76 - 1.27 mg/dL 0.94 1.01 1.08  BUN/Creat Ratio 9 - _1 -  Sodium 134 - 144 mmol/L 140 139 142  Potassium 3.5 - 5.2 mmol/L 4.9 4.5 3.9  Chloride 96 - 106 mmol/L 102 102 110  CO2 20 - 29 mmol/L _2 Calcium 8.7 - 10.2 mg/dL 9.5 9.2 8.6(L)     Hepatic Function Latest Ref Rng & Units 06/06/2020 11/07/2019 05/05/2019  Total Protein 6.0 - 8.5 g/dL 7.3 7.5 4.9(L)  Albumin 4.0 - 5.0 g/dL 4.3 4.2 2.7(L)  AST 0 - 40 IU/L _3 ALT 0 - 44 IU/L _4 Alk Phosphatase 44 - 121 IU/L 137(H) 70 37(L)  Total Bilirubin 0.0 - 1.2 mg/dL 0.5 0.8 0.5  Bilirubin, Direct 0.0 - 0.2 mg/dL - - <0.1    CBC Latest Ref Rng & Units 06/06/2020 11/08/2019 11/07/2019  WBC 3.4 - 10.8 x10E3/uL 9.8 5.9 7.3  Hemoglobin 13.0 - 17.7 g/dL 13.3 10.5(L) 11.5(L)  Hematocrit 37.5 - 51.0 % 42.0 33.8(L) 36.8(L)  Platelets 150 - 450 x10E3/uL 396 329 364   Lab Results  Component Value Date   MCV 74 (L) 06/06/2020   MCV 88.9 11/08/2019   MCV 86.2 11/07/2019   Lab Results  Component Value Date   TSH 0.227 (L) 06/06/2020   Lab Results  Component Value Date   HGBA1C 4.6 (L) 11/07/2019      BNP    Component Value Date/Time   BNP 18.6 11/07/2019 0635    ProBNP No results found for: PROBNP   Lipid Panel     Component Value Date/Time   CHOL 103 06/06/2020 0953   TRIG 77 06/06/2020 0953   HDL 29 (L) 06/06/2020 0953   CHOLHDL 3.6 06/06/2020 0953   CHOLHDL 4.5 11/07/2019 0304   VLDL 34 11/07/2019 0304   LDLCALC 58 06/06/2020 0953   LABVLDL 16 06/06/2020 0953     RADIOLOGY: No results found.  EMERGENT CATH/PCI: 11/07/2019  Additional studies/ records that were reviewed today include:    Prox RCA lesion is 70% stenosed.  1st Mrg lesion is 70% stenosed.  2nd Mrg lesion is 100% stenosed.   Acute ST segment elevation myocardial infarction secondary to total occlusion of a large OM 2 vessel in a left dominant circulation.  The left main is very short and immediately bifurcates into a normal large LAD vessel without significant obstruction; circumflex vessel gives rise to a high marginal OM1 which has a ramus distribution and has diffuse 70% proximal narrowing, the OM 2 vessel is totally occluded with TIMI 0 flow and the distal circumflex supplies the inferolateral and posterior lateral wall; small nondominant RCA with diffuse 60-70% proximal to mid irregularity/stenosis.  Preserved global LV contractility with EF estimated at 55% with possible subtle focal inferior hypocontractility.  Successful PCI of the OM 2 vessel with PTCA and ultimate stenting with a  3.0 x 22 mm Resolute DES stent dilated to 3.1 mm with 100% occlusion being reduced to 0% and resumption of brisk TIMI-3 flow.  RECOMMENDATION:  DAPT for minimum of 12 months.  Initial adequate therapy for concomitant CAD of his large high marginal branch and small RCA vessel.  Initiate atorvastatin 80 mg for aggressive lipid-lowering therapy and attempt to induce plaque regression with target LDL less than 70 and preferably in the fifties or below.  Optimal blood pressure control.    Echocardiogram  11/07/19: 1. Normal LV systolic function; mild LVH; grade 2 diastolic dysfunction;  mild LAE.  2. Left ventricular ejection fraction, by estimation, is 60 to 65%. The  left ventricle has normal function. The left ventricle has no regional  wall motion abnormalities. There is mild left ventricular hypertrophy.  Left ventricular diastolic parameters  are consistent with Grade II diastolic dysfunction (pseudonormalization).  3. Right ventricular systolic function is normal. The right ventricular  size is normal.  4. Left atrial size was mildly dilated.  5. The mitral valve is normal in structure. Trivial mitral valve  regurgitation. No evidence of mitral stenosis.  6. The aortic valve is tricuspid. Aortic valve regurgitation is not  visualized. Mild aortic valve sclerosis is present, with no evidence of  aortic valve stenosis.  7. The inferior vena cava is normal in size with greater than 50%  respiratory variability, suggesting right atrial pressure of 3 mmHg.    ASSESSMENT:    1. ST elevation myocardial infarction involving left circumflex coronary artery ([place]): 11/07/2019 RX with DES stent to OM2   2. Coronary artery disease involving native coronary artery of native heart without angina pectoris   3. Hyperlipidemia with target LDL less than 70   4. Sinus tachycardia   5. Essential hypertension   6. Hypogonadism, male     PLAN:  Stephen Avila is a 89 year old gentleman who developed new onset chest pain the morning of Nov 07, 2019 and was found to have inferolateral ST segment elevation myocardial infarction secondary to total occlusion of a large OM 2 vessel of the dominant circumflex coronary artery.Marland Kitchen He underwent successful intervention with insertion of a Resolute 3.0 x 22 mm DES stent with the 100% occlusion being reduced to 0% and TIMI 0 flow improving to TIMI-3 flow. He had concomitant CAD involving his RCA and OM1 vessel.   A subsequent echo Doppler study has shown  normalization of LV function with essentially complete salvage of myocardium.  He has been without recurrent anginal symptoms since his initial event.  Due to concomitant CAD and his history of hypertension he was on amlodipine 5 mg, carvedilol 6.25 mg twice a day, and losartan 25 mg daily.  When I saw him for my initial evaluation with me in August 2021 I further titrated losartan to 50 mg daily for improved blood pressure control.  He has subsequently run out of amlodipine for approximately 5 days.  Recently he also has noticed increasing heart rate.  He is unaware of heart rate irregularity but just being faster.  On his ECG today his pulse was 120 bpm and he was in sinus tachycardia.  Subsequently when I evaluated him his heart rate had slowed down to the 90-100 range.  I am recommending further titration of carvedilol to 12.5 mg twice a day for 3 days and if his heart rate is still above 90 he will increase this to 18.75 mg twice a day.  I renewed his amlodipine today.  He  did develop an area of firmness in his right thigh following his testosterone injection several weeks ago.  Is very possible he may have developed a small intramuscular hematoma.  This is slowly improving.  He has not had recent laboratory and a complete set of fasting labs will be obtained including a comprehensive metabolic panel, CBC, TSH, free T4 as well as free T3 in addition to fasting lipid studies.  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  He has seen neurology for possible migraine headaches and was recommended a trial of Aimovig injection monthly, but this was denied by insurance and he is not on treatment.  I will see him in 6 weeks for reevaluation or sooner as needed.   Medication Adjustments/Labs and Tests Ordered: Current medicines are reviewed at length with the patient today.  Concerns regarding medicines are outlined above.  Medication changes, Labs and Tests ordered today are listed  in the Patient Instructions below. Patient Instructions  Medication Instructions:  Resume your amlodipine starting this evening Increase your carvedilol (Coreg) to 12.5 mg (2 pills) twice a day. In 3 days, if your heart rate is still 90 beats per minute or above, increase your carvedilol to 18.75 mg (3 pills) twice a day. *If you need a refill on your cardiac medications before your next appointment, please call your pharmacy*   Lab Work: CMET, lipid profile, CBC, TSH, Free T3 and Free T4 If you have labs (blood work) drawn today and your tests are completely normal, you will receive your results only by: Marland Kitchen MyChart Message (if you have MyChart) OR . A paper copy in the mail If you have any lab test that is abnormal or we need to change your treatment, we will call you to review the results.   Testing/Procedures: None ordered   Follow-Up: At [facility], you and your health needs are our priority.  As part of our continuing mission to provide you with exceptional heart care, we have created designated Provider Care Teams.  These Care Teams include your primary Cardiologist (physician) and Advanced Practice Providers (APPs -  Physician Assistants and Nurse Practitioners) who all work together to provide you with the care you need, when you need it.  We recommend signing up for the patient portal called "MyChart".  Sign up information is provided on this After Visit Summary.  MyChart is used to connect with patients for Virtual Visits (Telemedicine).  Patients are able to view lab/test results, encounter notes, upcoming appointments, etc.  Non-urgent messages can be sent to your provider as well.   To learn more about what you can do with MyChart, go to [email or web address].    Your next appointment:   6-8 weeks  The format for your next appointment:   In Person  Provider:   Shelva Majestic, MD   Other Instructions None     Signed, Shelva Majestic, MD  06/07/2020 4:46 PM     [place] [address], [place], [place], [place]  [address] Phone: [phone]

## 2020-06-06 DIAGNOSIS — R000 Tachycardia, unspecified: Secondary | ICD-10-CM | POA: Diagnosis not present

## 2020-06-06 DIAGNOSIS — E785 Hyperlipidemia, unspecified: Secondary | ICD-10-CM | POA: Diagnosis not present

## 2020-06-06 LAB — LIPID PANEL

## 2020-06-07 ENCOUNTER — Encounter: Payer: Self-pay | Admitting: Cardiovascular Disease

## 2020-06-07 LAB — CBC
Hematocrit: 42 % (ref 37.5–51.0)
Hemoglobin: 13.3 g/dL (ref 13.0–17.7)
MCH: 23.5 pg — ABNORMAL LOW (ref 26.6–33.0)
MCHC: 31.7 g/dL (ref 31.5–35.7)
MCV: 74 fL — ABNORMAL LOW (ref 79–97)
Platelets: 396 10*3/uL (ref 150–450)
RBC: 5.65 x10E6/uL (ref 4.14–5.80)
RDW: 16.4 % — ABNORMAL HIGH (ref 11.6–15.4)
WBC: 9.8 10*3/uL (ref 3.4–10.8)

## 2020-06-07 LAB — TSH+T4F+T3FREE
Free T4: 1.17 ng/dL (ref 0.82–1.77)
T3, Free: 3.6 pg/mL (ref 2.0–4.4)
TSH: 0.227 u[IU]/mL — ABNORMAL LOW (ref 0.450–4.500)

## 2020-06-07 LAB — COMPREHENSIVE METABOLIC PANEL
ALT: 22 IU/L (ref 0–44)
AST: 22 IU/L (ref 0–40)
Albumin/Globulin Ratio: 1.4 (ref 1.2–2.2)
Albumin: 4.3 g/dL (ref 4.0–5.0)
Alkaline Phosphatase: 137 IU/L — ABNORMAL HIGH (ref 44–121)
BUN/Creatinine Ratio: 14 (ref 9–20)
BUN: 13 mg/dL (ref 6–24)
Bilirubin Total: 0.5 mg/dL (ref 0.0–1.2)
CO2: 24 mmol/L (ref 20–29)
Calcium: 9.5 mg/dL (ref 8.7–10.2)
Chloride: 102 mmol/L (ref 96–106)
Creatinine, Ser: 0.94 mg/dL (ref 0.76–1.27)
GFR calc Af Amer: 114 mL/min/{1.73_m2} (ref >59)
GFR calc non Af Amer: 98 mL/min/{1.73_m2} (ref >59)
Globulin, Total: 3 g/dL (ref 1.5–4.5)
Glucose: 95 mg/dL (ref 65–99)
Potassium: 4.9 mmol/L (ref 3.5–5.2)
Sodium: 140 mmol/L (ref 134–144)
Total Protein: 7.3 g/dL (ref 6.0–8.5)

## 2020-06-07 LAB — LIPID PANEL
Chol/HDL Ratio: 3.6 ratio (ref 0.0–5.0)
Cholesterol, Total: 103 mg/dL (ref 100–199)
HDL: 29 mg/dL — ABNORMAL LOW (ref >39)
LDL Chol Calc (NIH): 58 mg/dL (ref 0–99)
Triglycerides: 77 mg/dL (ref 0–149)
VLDL Cholesterol Cal: 16 mg/dL (ref 5–40)

## 2020-07-11 ENCOUNTER — Ambulatory Visit: Payer: BC Managed Care – PPO | Admitting: Cardiovascular Disease

## 2020-07-27 ENCOUNTER — Other Ambulatory Visit: Payer: Self-pay | Admitting: Medical

## 2020-07-27 ENCOUNTER — Other Ambulatory Visit: Payer: Self-pay | Admitting: Cardiovascular Disease

## 2020-07-27 NOTE — Telephone Encounter (Signed)
*  STAT* If patient is at the pharmacy, call can be transferred to refill team.   1. Which medications need to be refilled? (please list name of each medication and dose if known) carvedilol (COREG) 6.25 MG tablet  2. Which pharmacy/location (including street and city if local pharmacy) is medication to be sent to? [id number] - [place], [place] - [address]  3. Do they need a 30 day or 90 day supply? 90   Patient will be out of medication this evening.  

## 2020-07-28 MED ORDER — CARVEDILOL 6.25 MG PO TABS: 12.5000 mg | ORAL_TABLET | Freq: Two times a day (BID) | ORAL | 3 refills | Status: DC

## 2020-07-28 NOTE — Telephone Encounter (Signed)
*  STAT* If patient is at the pharmacy, call can be transferred to refill team.   1. Which medications need to be refilled? (please list name of each medication and dose if known) carvedilol (COREG) 6.25 MG tablet  2. Which pharmacy/location (including street and city if local pharmacy) is medication to be sent to? [facility] [place], [place]  3. Do they need a 30 day or 90 day supply? 90   Patient will be out of medication this evening.

## 2020-07-28 NOTE — Telephone Encounter (Signed)
Pt's requesting a refill on carvedilol. This medication was not sent to pt's pharmacy, it was on no print, please resent pt's medication to pt's pharmacy as requested. Thanks

## 2020-07-28 NOTE — Addendum Note (Signed)
Addended by: Carter Kitten D on: 07/28/2020 09:41 AM   Modules accepted: Orders

## 2020-07-28 NOTE — Telephone Encounter (Signed)
° ° °  Pt's wife calling to follow up prescription. Prescription needs to send to [place], [place], and please end 90 days supply. pt needs it today. He ran out f meds

## 2020-07-28 NOTE — Addendum Note (Signed)
Addended by: Hinton Dyer on: 07/28/2020 10:02 AM   Modules accepted: Orders

## 2020-07-30 ENCOUNTER — Other Ambulatory Visit: Payer: Self-pay | Admitting: Cardiovascular Disease

## 2020-08-03 ENCOUNTER — Ambulatory Visit: Payer: BC Managed Care – PPO | Admitting: Physician Assistant

## 2020-08-03 ENCOUNTER — Other Ambulatory Visit: Payer: Self-pay

## 2020-08-03 MED ORDER — CARVEDILOL 6.25 MG PO TABS: 12.5000 mg | ORAL_TABLET | Freq: Two times a day (BID) | ORAL | 3 refills | Status: DC

## 2020-08-03 NOTE — Telephone Encounter (Signed)
*  STAT* If patient is at the pharmacy, call can be transferred to refill team.   1. Which medications need to be refilled? (please list name of each medication and dose if known) Carvedilol  2. Which pharmacy/location (including street and city if local pharmacy) is medication to be sent to?[place]  3. Do they need a 30 day or 90 day supply? 23  Patient girlfriend states that patient is completely out and pharmacy will not refill until the end of February

## 2020-08-03 NOTE — Telephone Encounter (Signed)
Girlfriend called in and stated that this med should have been sent to [facility] and and not [facility] .    She stated that the the instructions also needs to be changed  Pt is taking 2 tabs in the am and 2 tabs pm..    Pt is completely out of this meds and needs it today    Best number [phone]

## 2020-08-26 ENCOUNTER — Other Ambulatory Visit: Payer: Self-pay | Admitting: Medical

## 2020-08-28 NOTE — Telephone Encounter (Signed)
This is Dr. Kelly's pt. °

## 2020-09-10 ENCOUNTER — Other Ambulatory Visit: Payer: Self-pay | Admitting: Medical

## 2020-09-11 ENCOUNTER — Other Ambulatory Visit: Payer: Self-pay | Admitting: Medical

## 2020-11-07 DIAGNOSIS — M4716 Other spondylosis with myelopathy, lumbar region: Secondary | ICD-10-CM | POA: Diagnosis not present

## 2020-11-21 ENCOUNTER — Ambulatory Visit: Payer: BC Managed Care – PPO | Admitting: Family Medicine

## 2020-11-27 ENCOUNTER — Other Ambulatory Visit: Payer: Self-pay

## 2020-11-27 ENCOUNTER — Encounter: Payer: Self-pay | Admitting: Family Medicine

## 2020-11-27 ENCOUNTER — Ambulatory Visit: Payer: BC Managed Care – PPO | Admitting: Family Medicine

## 2020-11-27 VITALS — BP 140/86 | HR 100 | Temp 97.9°F | Ht 64.0 in | Wt 179.3 lb

## 2020-11-27 DIAGNOSIS — R718 Other abnormality of red blood cells: Secondary | ICD-10-CM

## 2020-11-27 DIAGNOSIS — G8929 Other chronic pain: Secondary | ICD-10-CM

## 2020-11-27 DIAGNOSIS — E291 Testicular hypofunction: Secondary | ICD-10-CM

## 2020-11-27 DIAGNOSIS — E782 Mixed hyperlipidemia: Secondary | ICD-10-CM

## 2020-11-27 DIAGNOSIS — F988 Other specified behavioral and emotional disorders with onset usually occurring in childhood and adolescence: Secondary | ICD-10-CM

## 2020-11-27 DIAGNOSIS — R61 Generalized hyperhidrosis: Secondary | ICD-10-CM | POA: Diagnosis not present

## 2020-11-27 DIAGNOSIS — M545 Low back pain, unspecified: Secondary | ICD-10-CM

## 2020-11-27 DIAGNOSIS — I252 Old myocardial infarction: Secondary | ICD-10-CM

## 2020-11-27 DIAGNOSIS — E230 Hypopituitarism: Secondary | ICD-10-CM | POA: Diagnosis not present

## 2020-11-27 DIAGNOSIS — I2510 Atherosclerotic heart disease of native coronary artery without angina pectoris: Secondary | ICD-10-CM

## 2020-11-27 DIAGNOSIS — E038 Other specified hypothyroidism: Secondary | ICD-10-CM

## 2020-11-27 DIAGNOSIS — G894 Chronic pain syndrome: Secondary | ICD-10-CM

## 2020-11-27 DIAGNOSIS — G43909 Migraine, unspecified, not intractable, without status migrainosus: Secondary | ICD-10-CM

## 2020-11-27 DIAGNOSIS — K279 Peptic ulcer, site unspecified, unspecified as acute or chronic, without hemorrhage or perforation: Secondary | ICD-10-CM

## 2020-11-27 DIAGNOSIS — I10 Essential (primary) hypertension: Secondary | ICD-10-CM

## 2020-11-27 DIAGNOSIS — Z87442 Personal history of urinary calculi: Secondary | ICD-10-CM

## 2020-11-27 MED ORDER — TESTOSTERONE CYPIONATE 200 MG/ML IM SOLN: 200.0000 mg | INTRAMUSCULAR | 0 refills | Status: DC

## 2020-11-27 NOTE — Patient Instructions (Addendum)
Call to schedule follow up visit with Dr Claiborne Billings.  Return in 2 weeks for am lab visit on 12/11/2020 to recheck testosterone levels.  We will request records from prior doctor's office, as well as endocrinology with [facility].  Return at your convenience for physical.
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
## 2020-11-28 ENCOUNTER — Encounter: Payer: Self-pay | Admitting: Family Medicine

## 2020-11-28 DIAGNOSIS — G43909 Migraine, unspecified, not intractable, without status migrainosus: Secondary | ICD-10-CM | POA: Insufficient documentation

## 2020-11-28 DIAGNOSIS — F988 Other specified behavioral and emotional disorders with onset usually occurring in childhood and adolescence: Secondary | ICD-10-CM | POA: Insufficient documentation

## 2020-11-28 DIAGNOSIS — G8929 Other chronic pain: Secondary | ICD-10-CM | POA: Insufficient documentation

## 2020-11-28 DIAGNOSIS — M545 Low back pain, unspecified: Secondary | ICD-10-CM | POA: Insufficient documentation

## 2020-11-28 DIAGNOSIS — G894 Chronic pain syndrome: Secondary | ICD-10-CM | POA: Insufficient documentation

## 2020-11-28 NOTE — Assessment & Plan Note (Signed)
S/p lithotripsy in the past.

## 2020-11-28 NOTE — Assessment & Plan Note (Signed)
Continue full dose statin in CAD hx.  The ASCVD Risk score Mikey Bussing DC Jr., et al., 2013) failed to calculate for the following reasons:   The patient has a prior MI or stroke diagnosis

## 2020-11-28 NOTE — Assessment & Plan Note (Addendum)
Presumed dx however not on thyroid replacement.  Will request prior PCP records as well as endocrinology records to review.  Check further labwork when he returns in 2 wks for T check.  Reviewing chart, he has normal MRI brain and normal thyroid uptake scan from 2017. He saw Dr Loanne Drilling at that time and more recently endo fellow at [facility] 10/2019 - notes reviewed.

## 2020-11-28 NOTE — Assessment & Plan Note (Signed)
H/o this - will check to see if he's still taking PPI daily - anticipate will need this.

## 2020-11-28 NOTE — Assessment & Plan Note (Signed)
Followed by [facility] pain management (Dr Suzie Portela) on buprenorphine

## 2020-11-28 NOTE — Assessment & Plan Note (Signed)
Await labwork from endo and prior PCP

## 2020-11-28 NOTE — Assessment & Plan Note (Addendum)
Generalized sweating.  Await endo records, consider further evaluation for this.

## 2020-11-28 NOTE — Assessment & Plan Note (Signed)
11/2019 s/p stent to OM2 now on brilinta 90mg  bid sees Dr Claiborne Billings

## 2020-11-28 NOTE — Assessment & Plan Note (Addendum)
Chronic, stable on daily carvedilol, losartan, amlodipine.

## 2020-11-28 NOTE — Assessment & Plan Note (Signed)
Diagnosed as a young adult by psychology at [facility] on adderall 10mg  as needed, this is managed by pain clinic.

## 2020-11-28 NOTE — Assessment & Plan Note (Signed)
Has seen neurology [facility]) treating with Roselyn Meier sampls

## 2020-11-28 NOTE — Assessment & Plan Note (Addendum)
Presumed due to central hypopituitarism. Continues testosterone therapy. Currently on 200mg  injections Q2 wks but notes widely fluctuating T levels on this regimen. Will check in 2 wks when he's mid-cycle

## 2020-12-04 ENCOUNTER — Other Ambulatory Visit: Payer: Self-pay | Admitting: Cardiovascular Disease

## 2020-12-04 ENCOUNTER — Other Ambulatory Visit: Payer: Self-pay | Admitting: Medical

## 2020-12-05 ENCOUNTER — Other Ambulatory Visit: Payer: Self-pay | Admitting: Cardiovascular Disease

## 2020-12-11 ENCOUNTER — Other Ambulatory Visit (INDEPENDENT_AMBULATORY_CARE_PROVIDER_SITE_OTHER): Payer: BC Managed Care – PPO

## 2020-12-11 ENCOUNTER — Other Ambulatory Visit: Payer: Self-pay

## 2020-12-11 ENCOUNTER — Other Ambulatory Visit: Payer: Self-pay | Admitting: Family Medicine

## 2020-12-11 DIAGNOSIS — R718 Other abnormality of red blood cells: Secondary | ICD-10-CM | POA: Diagnosis not present

## 2020-12-11 DIAGNOSIS — E782 Mixed hyperlipidemia: Secondary | ICD-10-CM | POA: Diagnosis not present

## 2020-12-11 DIAGNOSIS — E291 Testicular hypofunction: Secondary | ICD-10-CM

## 2020-12-11 DIAGNOSIS — E038 Other specified hypothyroidism: Secondary | ICD-10-CM | POA: Diagnosis not present

## 2020-12-11 DIAGNOSIS — E230 Hypopituitarism: Secondary | ICD-10-CM

## 2020-12-11 NOTE — Addendum Note (Signed)
Addended by: Cloyd Stagers on: 12/11/2020 09:03 AM   Modules accepted: Orders

## 2020-12-12 ENCOUNTER — Encounter: Payer: Self-pay | Admitting: Family Medicine

## 2020-12-12 DIAGNOSIS — R972 Elevated prostate specific antigen [PSA]: Secondary | ICD-10-CM | POA: Insufficient documentation

## 2020-12-12 LAB — INSULIN-LIKE GROWTH FACTOR: Insulin-Like GF-1: 264 ng/mL (ref 84–270)

## 2020-12-12 LAB — COMPREHENSIVE METABOLIC PANEL
ALT: 14 IU/L (ref 0–44)
AST: 17 IU/L (ref 0–40)
Albumin/Globulin Ratio: 1.4 (ref 1.2–2.2)
Albumin: 4.2 g/dL (ref 4.0–5.0)
Alkaline Phosphatase: 97 IU/L (ref 44–121)
BUN/Creatinine Ratio: 9 (ref 9–20)
BUN: 9 mg/dL (ref 6–24)
Bilirubin Total: 0.4 mg/dL (ref 0.0–1.2)
CO2: 24 mmol/L (ref 20–29)
Calcium: 9.5 mg/dL (ref 8.7–10.2)
Chloride: 103 mmol/L (ref 96–106)
Creatinine, Ser: 0.97 mg/dL (ref 0.76–1.27)
Globulin, Total: 3.1 g/dL (ref 1.5–4.5)
Glucose: 95 mg/dL (ref 65–99)
Potassium: 4 mmol/L (ref 3.5–5.2)
Sodium: 141 mmol/L (ref 134–144)
Total Protein: 7.3 g/dL (ref 6.0–8.5)
eGFR: 98 mL/min/{1.73_m2} (ref >59)

## 2020-12-12 LAB — LIPID PANEL
Chol/HDL Ratio: 4.5 ratio (ref 0.0–5.0)
Cholesterol, Total: 181 mg/dL (ref 100–199)
HDL: 40 mg/dL (ref >39)
LDL Chol Calc (NIH): 122 mg/dL — ABNORMAL HIGH (ref 0–99)
Triglycerides: 102 mg/dL (ref 0–149)
VLDL Cholesterol Cal: 19 mg/dL (ref 5–40)

## 2020-12-12 LAB — FERRITIN: Ferritin: 57 ng/mL (ref 30–400)

## 2020-12-12 LAB — PROLACTIN: Prolactin: 7.4 ng/mL (ref 4.0–15.2)

## 2020-12-12 LAB — T4, FREE: Free T4: 1.22 ng/dL (ref 0.82–1.77)

## 2020-12-12 LAB — T3: T3, Total: 145 ng/dL (ref 71–180)

## 2020-12-12 LAB — TESTOSTERONE,FREE AND TOTAL
Testosterone, Free: 15.6 pg/mL (ref 6.8–21.5)
Testosterone: 554 ng/dL (ref 264–916)

## 2020-12-12 LAB — TSH: TSH: 0.107 u[IU]/mL — ABNORMAL LOW (ref 0.450–4.500)

## 2020-12-12 LAB — CBC WITH DIFFERENTIAL/PLATELET

## 2020-12-12 LAB — PSA: Prostate Specific Ag, Serum: 6.1 ng/mL — ABNORMAL HIGH (ref 0.0–4.0)

## 2020-12-14 LAB — COMPREHENSIVE METABOLIC PANEL
ALT: 14 IU/L (ref 0–44)
AST: 15 IU/L (ref 0–40)
Albumin/Globulin Ratio: 1.3 (ref 1.2–2.2)
Albumin: 4 g/dL (ref 4.0–5.0)
Alkaline Phosphatase: 94 IU/L (ref 44–121)
BUN/Creatinine Ratio: 9 (ref 9–20)
BUN: 9 mg/dL (ref 6–24)
Bilirubin Total: 0.4 mg/dL (ref 0.0–1.2)
CO2: 24 mmol/L (ref 20–29)
Calcium: 9.4 mg/dL (ref 8.7–10.2)
Chloride: 103 mmol/L (ref 96–106)
Creatinine, Ser: 0.99 mg/dL (ref 0.76–1.27)
Globulin, Total: 3.2 g/dL (ref 1.5–4.5)
Glucose: 94 mg/dL (ref 65–99)
Potassium: 4 mmol/L (ref 3.5–5.2)
Sodium: 141 mmol/L (ref 134–144)
Total Protein: 7.2 g/dL (ref 6.0–8.5)
eGFR: 96 mL/min/{1.73_m2} (ref >59)

## 2020-12-14 LAB — CBC WITH DIFFERENTIAL/PLATELET
Basophils Absolute: 0 10*3/uL (ref 0.0–0.2)
Basos: 1 %
EOS (ABSOLUTE): 0.1 10*3/uL (ref 0.0–0.4)
Eos: 1 %
Hematocrit: 50.5 % (ref 37.5–51.0)
Hemoglobin: 17.2 g/dL (ref 13.0–17.7)
Immature Grans (Abs): 0 10*3/uL (ref 0.0–0.1)
Immature Granulocytes: 0 %
Lymphocytes Absolute: 1.3 10*3/uL (ref 0.7–3.1)
Lymphs: 20 %
MCH: 28.3 pg (ref 26.6–33.0)
MCHC: 34.1 g/dL (ref 31.5–35.7)
MCV: 83 fL (ref 79–97)
Monocytes Absolute: 0.5 10*3/uL (ref 0.1–0.9)
Monocytes: 7 %
Neutrophils Absolute: 4.5 10*3/uL (ref 1.4–7.0)
Neutrophils: 71 %
Platelets: 295 10*3/uL (ref 150–450)
RBC: 6.07 x10E6/uL — ABNORMAL HIGH (ref 4.14–5.80)
RDW: 16.3 % — ABNORMAL HIGH (ref 11.6–15.4)
WBC: 6.3 10*3/uL (ref 3.4–10.8)

## 2020-12-14 LAB — TSH: TSH: 0.104 u[IU]/mL — ABNORMAL LOW (ref 0.450–4.500)

## 2020-12-14 LAB — PSA: Prostate Specific Ag, Serum: 6 ng/mL — ABNORMAL HIGH (ref 0.0–4.0)

## 2020-12-14 LAB — LIPID PANEL
Chol/HDL Ratio: 4.6 ratio (ref 0.0–5.0)
Cholesterol, Total: 184 mg/dL (ref 100–199)
HDL: 40 mg/dL (ref >39)
LDL Chol Calc (NIH): 124 mg/dL — ABNORMAL HIGH (ref 0–99)
Triglycerides: 110 mg/dL (ref 0–149)
VLDL Cholesterol Cal: 20 mg/dL (ref 5–40)

## 2020-12-14 LAB — INSULIN-LIKE GROWTH FACTOR: Insulin-Like GF-1: 268 ng/mL (ref 84–270)

## 2020-12-14 LAB — TESTOSTERONE,FREE AND TOTAL
Testosterone, Free: 15.9 pg/mL (ref 6.8–21.5)
Testosterone: 630 ng/dL (ref 264–916)

## 2020-12-14 LAB — FERRITIN: Ferritin: 56 ng/mL (ref 30–400)

## 2020-12-14 LAB — T4, FREE: Free T4: 1.22 ng/dL (ref 0.82–1.77)

## 2020-12-14 LAB — PROLACTIN: Prolactin: 7.1 ng/mL (ref 4.0–15.2)

## 2020-12-14 LAB — T3: T3, Total: 121 ng/dL (ref 71–180)

## 2020-12-20 ENCOUNTER — Encounter: Payer: Self-pay | Admitting: Family Medicine

## 2020-12-20 ENCOUNTER — Other Ambulatory Visit: Payer: Self-pay

## 2020-12-20 ENCOUNTER — Ambulatory Visit: Payer: BC Managed Care – PPO | Admitting: Family Medicine

## 2020-12-20 VITALS — BP 146/82 | HR 76 | Temp 98.3°F | Ht 64.0 in | Wt 182.0 lb

## 2020-12-20 DIAGNOSIS — E038 Other specified hypothyroidism: Secondary | ICD-10-CM | POA: Diagnosis not present

## 2020-12-20 DIAGNOSIS — E291 Testicular hypofunction: Secondary | ICD-10-CM

## 2020-12-20 DIAGNOSIS — E230 Hypopituitarism: Secondary | ICD-10-CM

## 2020-12-20 DIAGNOSIS — E782 Mixed hyperlipidemia: Secondary | ICD-10-CM

## 2020-12-20 DIAGNOSIS — K5909 Other constipation: Secondary | ICD-10-CM | POA: Diagnosis not present

## 2020-12-20 DIAGNOSIS — R972 Elevated prostate specific antigen [PSA]: Secondary | ICD-10-CM | POA: Diagnosis not present

## 2020-12-20 DIAGNOSIS — I252 Old myocardial infarction: Secondary | ICD-10-CM

## 2020-12-20 DIAGNOSIS — R61 Generalized hyperhidrosis: Secondary | ICD-10-CM

## 2020-12-20 LAB — POCT URINALYSIS DIP (MANUAL ENTRY)
Bilirubin, UA: NEGATIVE
Blood, UA: NEGATIVE
Glucose, UA: NEGATIVE mg/dL
Ketones, POC UA: NEGATIVE mg/dL
Leukocytes, UA: NEGATIVE
Nitrite, UA: NEGATIVE
Protein Ur, POC: NEGATIVE mg/dL
Spec Grav, UA: >=1.03 — AB (ref 1.010–1.025)
Urobilinogen, UA: 0.2 E.U./dL
pH, UA: 5.5 (ref 5.0–8.0)

## 2020-12-20 MED ORDER — ATORVASTATIN CALCIUM 80 MG PO TABS: 1.0000 | ORAL_TABLET | Freq: Every day | ORAL | 3 refills | Status: DC

## 2020-12-20 MED ORDER — LINACLOTIDE 145 MCG PO CAPS: 145.0000 ug | ORAL_CAPSULE | Freq: Every day | ORAL | 3 refills | Status: DC

## 2020-12-20 NOTE — Progress Notes (Signed)
Patient ID: Stephen Avila, male    DOB: 1976/04/27, 45 y.o.   MRN: [id number]  This visit was conducted in person.  BP (!) 146/82   Pulse 76   Temp 98.3 F (36.8 C) (Temporal)   Ht 5\' 4"  (1.626 m)   Wt 182 lb (82.6 kg)   SpO2 98%   BMI 31.24 kg/m   BP Readings from Last 3 Encounters:  12/20/20 (!) 146/82  11/27/20 140/86  06/05/20 (!) 162/98    CC: discuss labs Subjective:   HPI: Stephen Avila is a 45 y.o. male presenting on 12/20/2020 for prostate levels and urine symptoms   See prior note for details. Seen here to establish care last month.  Physical labs returned showing elevated PSA to 6.   Over the last few months, noting some trouble with starting stream and sense of urgency when he gets out of truck after long drive. Some dribbling. Some lower pelvic pain last week that is now better.   No fevers/chills, dysuria, hematuria, abd pain, perineal or rectal pain, flank pain, nausea.  H/o kidney stones.   H/o chronic constipation since a child (reports regular laxative use as a child) - has been managed with linzess with benefit however it  can cause some GI upset.   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  H/o hypogonadism with hypotestosteronism, ?central hypopituitarism (MRI normal). Chronic severe hyperhidrosis.  H/o CAD s/p STEMI 11/2019.   Has been out of atorvastatin for months. Refilled today.      Relevant past medical, surgical, family and social history reviewed and updated as indicated. Interim medical history since our last visit reviewed. Allergies and medications reviewed and updated. Outpatient Medications Prior to Visit  Medication Sig Dispense Refill   acetaminophen (TYLENOL) 325 MG tablet Take 2 tablets (650 mg total) by mouth every 6 (six) hours as needed for mild pain (or Fever >/= 101).     amLODipine (NORVASC) 5 MG tablet TAKE 1 TABLET BY MOUTH EVERY DAY 90 tablet 1   amphetamine-dextroamphetamine (ADDERALL) 10 MG tablet Take 10 mg by  mouth as needed.     BRILINTA 90 MG TABS tablet TAKE 1 TABLET BY MOUTH TWICE DAILY 180 tablet 1   buprenorphine (SUBUTEX) 8 MG SUBL SL tablet Place 20 mg under the tongue daily.      carvedilol (COREG) 6.25 MG tablet TAKE 2 TABLETS(12.5 MG) BY MOUTH TWICE DAILY WITH A MEAL 180 tablet 2   losartan (COZAAR) 50 MG tablet Take 1 tablet (50 mg total) by mouth daily. 90 tablet 3   nitroGLYCERIN (NITROSTAT) 0.4 MG SL tablet Place 1 tablet (0.4 mg total) under the tongue every 5 (five) minutes x 3 doses as needed for chest pain. 25 tablet 3   pantoprazole (PROTONIX) 40 MG tablet Take 1 tablet (40 mg total) by mouth 2 (two) times daily. 180 tablet 3   testosterone cypionate (DEPOTESTOSTERONE CYPIONATE) 200 MG/ML injection Inject 1 mL (200 mg total) into the muscle every 14 (fourteen) days. 10 mL 0   vitamin B-12 1000 MCG tablet Take 1 tablet (1,000 mcg total) by mouth daily. 30 tablet 0   atorvastatin (LIPITOR) 80 MG tablet TAKE 1 TABLET BY MOUTH DAILY 330 tablet 1   No facility-administered medications prior to visit.     Per HPI unless specifically indicated in ROS section below Review of Systems Objective:  BP (!) 146/82   Pulse 76   Temp 98.3 F (36.8 C) (Temporal)   Ht 5\' 4"  (1.626  m)   Wt 182 lb (82.6 kg)   SpO2 98%   BMI 31.24 kg/m   Wt Readings from Last 3 Encounters:  12/20/20 182 lb (82.6 kg)  11/27/20 179 lb 5 oz (81.3 kg)  06/05/20 187 lb 12.8 oz (85.2 kg)      Physical Exam Vitals and nursing note reviewed.  Constitutional:      Appearance: Normal appearance. He is not ill-appearing.  Abdominal:     General: Abdomen is flat. Bowel sounds are normal. There is no distension.     Palpations: Abdomen is soft. There is no mass.     Tenderness: There is no abdominal tenderness. There is no right CVA tenderness, left CVA tenderness, guarding or rebound.     Hernia: No hernia is present.  Genitourinary:    Prostate: Normal. Not enlarged (20gm), not tender and no nodules present.      Rectum: Normal. No mass, tenderness, anal fissure, external hemorrhoid or internal hemorrhoid. Normal anal tone.  Musculoskeletal:     Right lower leg: No edema.     Left lower leg: No edema.  Skin:    General: Skin is warm and dry.     Findings: No rash.  Neurological:     Mental Status: He is alert.  Psychiatric:        Mood and Affect: Mood normal.        Behavior: Behavior normal.      Results for orders placed or performed in visit on 12/20/20  POCT urinalysis dipstick  Result Value Ref Range   Color, UA yellow yellow   Clarity, UA clear clear   Glucose, UA negative negative mg/dL   Bilirubin, UA negative negative   Ketones, POC UA negative negative mg/dL   Spec Grav, UA >=1.030 (A) 1.010 - 1.025   Blood, UA negative negative   pH, UA 5.5 5.0 - 8.0   Protein Ur, POC negative negative mg/dL   Urobilinogen, UA 0.2 0.2 or 1.0 E.U./dL   Nitrite, UA Negative Negative   Leukocytes, UA Negative Negative   Lab Results  Component Value Date   TSH 0.107 (L) 12/11/2020   TSH 0.104 (L) 12/11/2020   T3TOTAL 145 12/11/2020   T3TOTAL 121 12/11/2020   T4TOTAL 9.1 08/20/2013    Assessment & Plan:  This visit occurred during the SARS-CoV-2 public health emergency.  Safety protocols were in place, including screening questions prior to the visit, additional usage of staff PPE, and extensive cleaning of exam room while observing appropriate contact time as indicated for disinfecting solutions.   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        Central hypothyroidism    Chronic low TSH, normal T3/fT4 off thyroid replacement.  Has seen endo Loanne Drilling and [facility]) - consider return.        Relevant Orders   Ambulatory referral to Urology   Hypogonadism, male    T levels normal mid cycle (600s).        Relevant Orders   Ambulatory referral to  Urology   Pituitary insufficiency [facility])    ?h/o this - IGF, prolactin normal.  Last endo eval at [facility] - 10/2019.        Diaphoresis    Chronic generalized hyperhydrosis       History of ST elevation myocardial infarction (STEMI)    Restart atorvastatin, continue bid brilinta.  HLD (hyperlipidemia)    Restart atorvastatin 80mg  daily - had run out recently. Rpt FLP at next labwork.  The ASCVD Risk score Mikey Bussing DC Jr., et al., 2013) failed to calculate for the following reasons:   The patient has a prior MI or stroke diagnosis        Relevant Medications   atorvastatin (LIPITOR) 80 MG tablet   Elevated PSA - Primary    Newly noted. No prior records available.  Endorses some LUTS - elevated I-PSS = 20/4.  Reassuring DRE today, no significant evidence of BPH or prostatitis on exam. Check UA today.  Discussed uro eval for elevated PSA in setting of testosterone use.        Relevant Orders   POCT urinalysis dipstick (Completed)   Ambulatory referral to Urology     Meds ordered this encounter  Medications   atorvastatin (LIPITOR) 80 MG tablet    Sig: Take 1 tablet (80 mg total) by mouth daily.    Dispense:  90 tablet    Refill:  3   linaclotide (LINZESS) 145 MCG CAPS capsule    Sig: Take 1 capsule (145 mcg total) by mouth daily before breakfast.    Dispense:  30 capsule    Refill:  3   Orders Placed This Encounter  Procedures   Ambulatory referral to Urology    Referral Priority:   Routine    Referral Type:   Consultation    Referral Reason:   Specialty Services Required    Requested Specialty:   Urology    Number of Visits Requested:   1   POCT urinalysis dipstick    Patient Instructions  Urinalysis today.  Restart atorvastatin - sent to pharmacy.  We will be in touch with results and plan. Pending results we may send you to urology for elevated prostate level.  Good to see you today.    Follow up plan: Return if symptoms worsen or fail to  improve.  [name], MD

## 2020-12-20 NOTE — Patient Instructions (Addendum)
Urinalysis today.  Restart atorvastatin - sent to pharmacy.  We will be in touch with results and plan. Pending results we may send you to urology for elevated prostate level.  Good to see you today.

## 2020-12-21 ENCOUNTER — Encounter: Payer: Self-pay | Admitting: Family Medicine

## 2020-12-21 NOTE — Assessment & Plan Note (Addendum)
Restart atorvastatin, continue bid brilinta.

## 2020-12-21 NOTE — Assessment & Plan Note (Signed)
Chronic generalized hyperhydrosis

## 2020-12-21 NOTE — Assessment & Plan Note (Addendum)
Newly noted. No prior records available.  Endorses some LUTS - elevated I-PSS = 20/4.  Reassuring DRE today, no significant evidence of BPH or prostatitis on exam. Check UA today.  Discussed uro eval for elevated PSA in setting of testosterone use.

## 2020-12-21 NOTE — Progress Notes (Signed)
Noted  Referral updated 

## 2020-12-21 NOTE — Assessment & Plan Note (Signed)
T levels normal mid cycle (600s).

## 2020-12-21 NOTE — Assessment & Plan Note (Signed)
Restart atorvastatin 80mg  daily - had run out recently. Rpt FLP at next labwork.  The ASCVD Risk score Stephen Bussing DC Jr., Stephen al., 2013) failed to calculate for the following reasons:   The patient has a prior MI or stroke diagnosis

## 2020-12-21 NOTE — Assessment & Plan Note (Addendum)
?  h/o this - IGF, prolactin normal.  Last endo eval at [facility] - 10/2019.

## 2020-12-21 NOTE — Assessment & Plan Note (Addendum)
Chronic low TSH, normal T3/fT4 off thyroid replacement.  Has seen endo Loanne Drilling and [facility]) - consider return.

## 2020-12-21 NOTE — Assessment & Plan Note (Addendum)
Presumed chronic idiopathic constipation previously managed with linzess - have refilled per pt request.

## 2021-01-03 ENCOUNTER — Ambulatory Visit (INDEPENDENT_AMBULATORY_CARE_PROVIDER_SITE_OTHER): Payer: BC Managed Care – PPO | Admitting: Urology

## 2021-01-03 ENCOUNTER — Other Ambulatory Visit: Payer: Self-pay

## 2021-01-03 ENCOUNTER — Encounter: Payer: Self-pay | Admitting: Urology

## 2021-01-03 VITALS — BP 172/89 | HR 83 | Ht 64.0 in | Wt 185.0 lb

## 2021-01-03 DIAGNOSIS — N401 Enlarged prostate with lower urinary tract symptoms: Secondary | ICD-10-CM

## 2021-01-03 DIAGNOSIS — R972 Elevated prostate specific antigen [PSA]: Secondary | ICD-10-CM | POA: Diagnosis not present

## 2021-01-03 LAB — BLADDER SCAN AMB NON-IMAGING: Scan Result: 15

## 2021-01-03 MED ORDER — TAMSULOSIN HCL 0.4 MG PO CAPS: 0.4000 mg | ORAL_CAPSULE | Freq: Every day | ORAL | 0 refills | Status: DC

## 2021-01-08 ENCOUNTER — Encounter: Payer: Self-pay | Admitting: Urology

## 2021-01-08 NOTE — Progress Notes (Signed)
01/03/2021 12:17 PM   Stephen Avila [birth year] [id number]  Referring provider: [name], MD [address] [place],  [place] [address]  Chief Complaint  Patient presents with   Elevated PSA    HPI: Stephen Avila is a 45 y.o. male referred for evaluation of an elevated PSA.  PSA 12/11/2020 was elevated at 6.0; a repeat run was performed on the same specimen which was 6.1 On TRT testosterone cypionate 200 mg every 2 weeks No previous PSA results seen on record review 77-month history of bothersome LUTS including urinary frequency, urgency, hesitancy and decreased force and caliber of urinary stream Denies dysuria, gross hematuria No flank, abdominal or pelvic pain Urinalysis was unremarkable Past urologic history markable for recurrent stone disease with prior lithotripsy   PMH: Past Medical History:  Diagnosis Date   Anxiety    Constipation    GERD (gastroesophageal reflux disease)    Heart murmur    History of chicken pox    Hypertension    Kidney stones    PUD (peptic ulcer disease)     Surgical History: Past Surgical History:  Procedure Laterality Date   CHOLECYSTECTOMY N/A 06/26/2015   Procedure: LAPAROSCOPIC CHOLECYSTECTOMY;  Surgeon: [name], MD;  Location: [facility];  Service: General;  Laterality: N/A;   COLONOSCOPY WITH PROPOFOL N/A 05/07/2019   Procedure: COLONOSCOPY WITH PROPOFOL;  Surgeon: [name], MD;  Location: [facility];  Service: Gastroenterology;  Laterality: N/A;   CORONARY/GRAFT ACUTE MI REVASCULARIZATION N/A 11/07/2019   Procedure: Coronary/Graft Acute MI Revascularization;  Surgeon: [name], MD;  Location: [facility];  Service: Cardiovascular;  Laterality: N/A;   ESOPHAGOGASTRODUODENOSCOPY N/A 05/07/2019   Procedure: ESOPHAGOGASTRODUODENOSCOPY (EGD);  Surgeon: [name], MD;  Location: [facility];  Service: Gastroenterology;  Laterality: N/A;   HERNIA REPAIR  1979, 1981   x 2     LEFT HEART CATH AND CORONARY ANGIOGRAPHY N/A 11/07/2019   Procedure: LEFT HEART CATH AND CORONARY ANGIOGRAPHY;  Surgeon: [name], MD;  Location: [facility];  Service: Cardiovascular;  Laterality: N/A;   LITHOTRIPSY     multiple    Home Medications:  Allergies as of 01/03/2021       Reactions   Erythromycin    Made chest feel "funny"        Medication List        Accurate as of January 03, 2021 11:59 PM. If you have any questions, ask your nurse or doctor.          acetaminophen 325 MG tablet Commonly known as: TYLENOL Take 2 tablets (650 mg total) by mouth every 6 (six) hours as needed for mild pain (or Fever >/= 101).   amLODipine 5 MG tablet Commonly known as: NORVASC TAKE 1 TABLET BY MOUTH EVERY DAY   amphetamine-dextroamphetamine 10 MG tablet Commonly known as: ADDERALL Take 10 mg by mouth as needed.   atorvastatin 80 MG tablet Commonly known as: LIPITOR Take 1 tablet (80 mg total) by mouth daily.   Brilinta 90 MG Tabs tablet Generic drug: ticagrelor TAKE 1 TABLET BY MOUTH TWICE DAILY   buprenorphine 8 MG Subl SL tablet Commonly known as: SUBUTEX Place 20 mg under the tongue daily.   carvedilol 6.25 MG tablet Commonly known as: COREG TAKE 2 TABLETS(12.5 MG) BY MOUTH TWICE DAILY WITH A MEAL   cyanocobalamin 1000 MCG tablet Take 1 tablet (1,000 mcg total) by mouth daily.   linaclotide 145 MCG Caps capsule  Commonly known as: Linzess Take 1 capsule (145 mcg total) by mouth daily before breakfast.   losartan 50 MG tablet Commonly known as: COZAAR Take 1 tablet (50 mg total) by mouth daily.   nitroGLYCERIN 0.4 MG SL tablet Commonly known as: NITROSTAT Place 1 tablet (0.4 mg total) under the tongue every 5 (five) minutes x 3 doses as needed for chest pain.   pantoprazole 40 MG tablet Commonly known as: PROTONIX Take 1 tablet (40 mg total) by mouth 2 (two) times daily.   tamsulosin 0.4 MG Caps capsule Commonly known as: FLOMAX Take 1  capsule (0.4 mg total) by mouth daily. Started by: Abbie Sons, MD   testosterone cypionate 200 MG/ML injection Commonly known as: DEPOTESTOSTERONE CYPIONATE Inject 1 mL (200 mg total) into the muscle every 14 (fourteen) days.        Allergies:  Allergies  Allergen Reactions   Erythromycin     Made chest feel "funny"    Family History: Family History  Problem Relation Age of Onset   COPD Mother    Arthritis Mother    Asthma Mother    Lung cancer Mother        smoker   Early death Mother 9   Heart attack Mother    Heart disease Mother    Learning disabilities Mother    Miscarriages / Stillbirths Mother    Heart failure Father    Diabetes Father    Early death Father 47   Heart attack Father    Learning disabilities Father    Thyroid disease Neg Hx     Social History:  reports that he has never smoked. He quit smokeless tobacco use about 3 months ago.  His smokeless tobacco use included snuff. He reports current alcohol use. He reports that he does not use drugs.   Physical Exam: BP (!) 172/89   Pulse 83   Ht 5\' 4"  (1.626 m)   Wt 185 lb (83.9 kg)   BMI 31.76 kg/m   Constitutional:  Alert and oriented, No acute distress. HEENT: [place] AT, moist mucus membranes.  Trachea midline, no masses. Cardiovascular: No clubbing, cyanosis, or edema. Respiratory: Normal respiratory effort, no increased work of breathing. GI: Abdomen is soft, nontender, nondistended, no abdominal masses GU: Prostate 45 g, smooth without nodules Neurologic: Grossly intact, no focal deficits, moving all 4 extremities. Psychiatric: Normal mood and affect.    Assessment & Plan:    1. Elevated PSA Benign DRE No prior PSA results available for comparison Although PSA is a prostate cancer screening test he was informed that cancer is not the most common cause of an elevated PSA. Other potential causes including BPH and inflammation were discussed. He was informed that the only way to  adequately diagnose prostate cancer would be a transrectal ultrasound and biopsy of the prostate. The procedure was discussed including potential risks of bleeding and infection/sepsis.   He does have bothersome lower urinary tract symptoms and have initially recommended a 30-day trial of tamsulosin with a repeat PSA in 4 weeks. If PSA remains elevated would recommend prostate MRI If MRI was negative and he desires to stay on TRT would recommend standard prostate biopsy due to the false-negative rate of MRI  2.  BPH with LUTS Trial of tamsulosin as above If he does note significant improvement in voiding symptoms this medication can be continued    Abbie Sons, MD  [facility] [address], [place], [place] [address] [phone]  [phone]   

## 2021-01-21 ENCOUNTER — Other Ambulatory Visit: Payer: Self-pay | Admitting: Family Medicine

## 2021-01-21 DIAGNOSIS — E782 Mixed hyperlipidemia: Secondary | ICD-10-CM

## 2021-01-22 ENCOUNTER — Telehealth (INDEPENDENT_AMBULATORY_CARE_PROVIDER_SITE_OTHER): Payer: BC Managed Care – PPO | Admitting: Family Medicine

## 2021-01-22 ENCOUNTER — Encounter: Payer: Self-pay | Admitting: Family Medicine

## 2021-01-22 ENCOUNTER — Telehealth: Payer: Self-pay | Admitting: *Deleted

## 2021-01-22 ENCOUNTER — Other Ambulatory Visit: Payer: BC Managed Care – PPO

## 2021-01-22 ENCOUNTER — Telehealth: Payer: Self-pay | Admitting: Family Medicine

## 2021-01-22 VITALS — BP 119/66 | HR 84 | Temp 98.6°F | Ht 64.0 in | Wt 185.0 lb

## 2021-01-22 DIAGNOSIS — U071 COVID-19: Secondary | ICD-10-CM | POA: Diagnosis not present

## 2021-01-22 DIAGNOSIS — I10 Essential (primary) hypertension: Secondary | ICD-10-CM

## 2021-01-22 DIAGNOSIS — I252 Old myocardial infarction: Secondary | ICD-10-CM

## 2021-01-22 MED ORDER — MOLNUPIRAVIR EUA 200MG CAPSULE: 4.0000 | ORAL_CAPSULE | Freq: Two times a day (BID) | ORAL | 0 refills | Status: AC

## 2021-01-22 NOTE — Assessment & Plan Note (Signed)
Recommend: Molnupiravir - due to significant drug interactions with Paxlovid (amlodipine, atorvastatin, buprenorphine, ticagrelor, and tamsulosin) Drug interactions:none  Reviewed currently approved EUA treatments.  Reviewed expected course of illness, anticipated course of recovery, as well as red flags to suggested COVID pneumonia or to seek urgent in-person care. Reviewed latest CDC isolation/quarantining guidelines.  Encouraged fluids and rest. Reviewed further supportive care measures at home including vit C, vit D, zinc, tylenol PRN.

## 2021-01-22 NOTE — Telephone Encounter (Signed)
Noted  

## 2021-01-22 NOTE — Telephone Encounter (Signed)
Patient's girlfriend [name] on [place]) called stating that she is concerned because his oxygen level keeps dropping and it is now at 90%. [name] stated during the night his oxygen level was 95%. [name] stated that he seems be confused at times. [name] stated that he has congestion deep in his chest, throat is raw and a rattle in his chest. [name] denies that he has SOB.  [name] stated that he had a fever earlier this morning of 102.3 and after taking tylenol his fever is now 100.1. [name] stated that she does not know if he can wait until this afternoon for his virtual visit with Dr. Danise Mina. Earley Brooke if she feels that he can not wait she can take him to the ER. [name] stated that she will go ahead and take him to [facility] ER now. [name] stated not to cancel the appointment for this afternoon in case the wait is too long at the ER. [name] stated that she will call back and cancel the appointment if he ends up being seen at the ER.

## 2021-01-22 NOTE — Telephone Encounter (Signed)
Seen virtually today.  

## 2021-01-22 NOTE — Telephone Encounter (Signed)
Plz call Wednesday for update on COVID symptoms.

## 2021-01-22 NOTE — Progress Notes (Signed)
Patient ID: Stephen Avila, male    DOB: [birth year], 45 y.o.   MRN: [id number]  Virtual visit completed through [employer], a video enabled telemedicine application. Due to national recommendations of social distancing due to COVID-19, a virtual visit is felt to be most appropriate for this patient at this time. Reviewed limitations, risks, security and privacy concerns of performing a virtual visit and the availability of in person appointments. I also reviewed that there may be a patient responsible charge related to this service. The patient agreed to proceed.   Patient location: home Provider location: [place] [facility], office Persons participating in this virtual visit: patient, provider, girlfriend Michaela Corner  If any vitals were documented, they were collected by patient at home unless specified below.    BP 119/66   Pulse 84   Temp 98.6 F (37 C)   Ht 5\' 4"  (1.626 m)   Wt 185 lb (83.9 kg)   SpO2 94%   BMI 31.76 kg/m    CC: COVID positive Subjective:   HPI: Stephen Avila is a 45 y.o. male presenting on 01/22/2021 for Fever (C/o fever- max 102.3, nasal drainage, cough, chest congestion and confusion.  Also, c/o HA and facial pain. Sxs started yesterday.  Tried Tylenol, helpful with fever.  Positive home COVID test results yesterday.  Has not had COVID vaccine. )   First day of symptoms: 01/21/2021 Tested COVID positive: 01/21/2021  Current symptoms: fever, chills Tmax 102.3, nasal drainage, chest congestion, sinus HA with facial pain. Episode of confusion "disoriented" last night after he woke up.  Blowing nose with clear mucous. Raw dry cough.  No: chest pain, dyspnea, wheezing, loss of taste/smell, abd pain, nausea, diarrhea.  Checking O2 sats - down to 91%.  Treatments to date: tylenol. Avoids NSAIDs in h/o GI bleed.  Risk factors include: CAD h/o STEMI, hypertension   COVID vaccination status: un-vaccinated   Discussed will need to reschedule CPE.       Relevant past medical, surgical, family and social history reviewed and updated as indicated. Interim medical history since our last visit reviewed. Allergies and medications reviewed and updated. Outpatient Medications Prior to Visit  Medication Sig Dispense Refill   acetaminophen (TYLENOL) 325 MG tablet Take 2 tablets (650 mg total) by mouth every 6 (six) hours as needed for mild pain (or Fever >/= 101).     amLODipine (NORVASC) 5 MG tablet TAKE 1 TABLET BY MOUTH EVERY DAY 90 tablet 1   amphetamine-dextroamphetamine (ADDERALL) 10 MG tablet Take 10 mg by mouth as needed.     atorvastatin (LIPITOR) 80 MG tablet Take 1 tablet (80 mg total) by mouth daily. 90 tablet 3   BRILINTA 90 MG TABS tablet TAKE 1 TABLET BY MOUTH TWICE DAILY 180 tablet 1   buprenorphine (SUBUTEX) 8 MG SUBL SL tablet Place 20 mg under the tongue daily.      carvedilol (COREG) 6.25 MG tablet TAKE 2 TABLETS(12.5 MG) BY MOUTH TWICE DAILY WITH A MEAL 180 tablet 2   linaclotide (LINZESS) 145 MCG CAPS capsule Take 1 capsule (145 mcg total) by mouth daily before breakfast. 30 capsule 3   losartan (COZAAR) 50 MG tablet Take 1 tablet (50 mg total) by mouth daily. 90 tablet 3   nitroGLYCERIN (NITROSTAT) 0.4 MG SL tablet Place 1 tablet (0.4 mg total) under the tongue every 5 (five) minutes x 3 doses as needed for chest pain. 25 tablet 3   pantoprazole (PROTONIX) 40 MG tablet Take 1  tablet (40 mg total) by mouth 2 (two) times daily. 180 tablet 3   tamsulosin (FLOMAX) 0.4 MG CAPS capsule Take 1 capsule (0.4 mg total) by mouth daily. 30 capsule 0   testosterone cypionate (DEPOTESTOSTERONE CYPIONATE) 200 MG/ML injection Inject 1 mL (200 mg total) into the muscle every 14 (fourteen) days. 10 mL 0   vitamin B-12 1000 MCG tablet Take 1 tablet (1,000 mcg total) by mouth daily. 30 tablet 0   No facility-administered medications prior to visit.     Per HPI unless specifically indicated in ROS section below Review of Systems Objective:  BP  119/66   Pulse 84   Temp 98.6 F (37 C)   Ht 5\' 4"  (1.626 m)   Wt 185 lb (83.9 kg)   SpO2 94%   BMI 31.76 kg/m   Wt Readings from Last 3 Encounters:  01/22/21 185 lb (83.9 kg)  01/03/21 185 lb (83.9 kg)  12/20/20 182 lb (82.6 kg)       Physical exam: Gen: alert, NAD, not ill appearing Pulm: speaks in complete sentences without increased work of breathing Psych: normal mood, normal thought content      Results for orders placed or performed in visit on 01/03/21  Bladder Scan (Post Void Residual) in office  Result Value Ref Range   Scan Result 15    Assessment & Plan:   Problem List Items Addressed This Visit     History of ST elevation myocardial infarction (STEMI)   Essential hypertension   COVID-19 virus infection - Primary    Recommend: Molnupiravir - due to significant drug interactions with Paxlovid (amlodipine, atorvastatin, buprenorphine, ticagrelor, and tamsulosin) Drug interactions:none  Reviewed currently approved EUA treatments.  Reviewed expected course of illness, anticipated course of recovery, as well as red flags to suggested COVID pneumonia or to seek urgent in-person care. Reviewed latest CDC isolation/quarantining guidelines.  Encouraged fluids and rest. Reviewed further supportive care measures at home including vit C, vit D, zinc, tylenol PRN.        Relevant Medications   molnupiravir EUA 200 mg CAPS     Meds ordered this encounter  Medications   molnupiravir EUA 200 mg CAPS    Sig: Take 4 capsules (800 mg total) by mouth 2 (two) times daily for 5 days.    Dispense:  40 capsule    Refill:  0   No orders of the defined types were placed in this encounter.   I discussed the assessment and treatment plan with the patient. The patient was provided an opportunity to ask questions and all were answered. The patient agreed with the plan and demonstrated an understanding of the instructions. The patient was advised to call back or seek an  in-person evaluation if the symptoms worsen or if the condition fails to improve as anticipated.  Follow up plan: No follow-ups on file.  [name], MD

## 2021-01-23 ENCOUNTER — Other Ambulatory Visit: Payer: Self-pay | Admitting: *Deleted

## 2021-01-23 DIAGNOSIS — R972 Elevated prostate specific antigen [PSA]: Secondary | ICD-10-CM

## 2021-01-23 NOTE — Progress Notes (Signed)
ps

## 2021-01-24 NOTE — Telephone Encounter (Signed)
Lvm asking pt to call back.  Need an update on sxs.  

## 2021-01-25 NOTE — Telephone Encounter (Signed)
Lvm asking pt to call back.  Need an update on sxs.  

## 2021-01-26 NOTE — Telephone Encounter (Signed)
Lvm asking pt to call back.  Need an update on sxs.  

## 2021-01-29 ENCOUNTER — Encounter: Payer: BC Managed Care – PPO | Admitting: Family Medicine

## 2021-01-29 ENCOUNTER — Telehealth: Payer: Self-pay

## 2021-01-29 NOTE — Telephone Encounter (Signed)
Pt's significant other called in with update. She states patient has finished his medication and is still extremely congested. Please advise

## 2021-01-29 NOTE — Telephone Encounter (Signed)
Close encounter 

## 2021-01-29 NOTE — Telephone Encounter (Signed)
Patient ntbs for ubrlevy prior authorization, no appt is scheduled. Advised patient ntbs.

## 2021-01-30 NOTE — Telephone Encounter (Signed)
Cough, fatigue and brain fog all symptoms of residual or long COVID.  Any fever or worsening cough, dyspnea?  If not, would encourage plain mucinex with large glass of water for congestion. Could offer prednisone course for cough, fatigue.

## 2021-01-30 NOTE — Telephone Encounter (Signed)
[name] called in stated that he is still having congestion and cough and still fatigue and feels like he is in a fog.

## 2021-01-31 NOTE — Telephone Encounter (Signed)
Stephen Avila and patient and left a message to call us back

## 2021-02-01 NOTE — Telephone Encounter (Signed)
Lvm asking pt to call back. Need to relay Dr. G's message and get answer to his question.  

## 2021-02-02 MED ORDER — PREDNISONE 10 MG PO TABS: ORAL_TABLET | ORAL | 0 refills | Status: DC

## 2021-02-02 NOTE — Telephone Encounter (Signed)
Prednisone sent. Thanks.

## 2021-02-02 NOTE — Telephone Encounter (Signed)
Lvm asking pt to call back. Need to relay Dr. G's message and get answer to his question.  

## 2021-02-02 NOTE — Telephone Encounter (Signed)
Noted  

## 2021-02-02 NOTE — Addendum Note (Signed)
Addended by: Tonia Ghent on: 02/02/2021 02:35 PM   Modules accepted: Orders

## 2021-02-02 NOTE — Telephone Encounter (Signed)
Pt's girlfriend, [name] (on d[place]), calling back.  I relayed Dr. Synthia Innocent message and asked about sxs.  She verbalizes understanding and states pt still has mild cough and fatigue.  Says she will inform pt about plain Mucinex and asks for prednisone to be sent to pharmacy.

## 2021-02-05 ENCOUNTER — Other Ambulatory Visit: Payer: BC Managed Care – PPO

## 2021-02-06 ENCOUNTER — Encounter: Payer: Self-pay | Admitting: Urology

## 2021-02-07 DIAGNOSIS — M4716 Other spondylosis with myelopathy, lumbar region: Secondary | ICD-10-CM | POA: Diagnosis not present

## 2021-02-18 NOTE — Progress Notes (Deleted)
02/19/2021 12:48 PM   Stephen Avila 06-10-1976 [id number]  Referring provider: [name], MD [place],  [place] [id number]  No chief complaint on file.   HPI: 45 y.o. male called for acute visit for low back and lower abdominal pain.  Seen 01/03/2021 for an elevated PSA and bothersome lower urinary tract symptoms Started on tamsulosin and follow-up PSA recommended which has not been drawn   PMH: Past Medical History:  Diagnosis Date   Anxiety    Constipation    GERD (gastroesophageal reflux disease)    Heart murmur    History of chicken pox    Hypertension    Kidney stones    PUD (peptic ulcer disease)     Surgical History: Past Surgical History:  Procedure Laterality Date   CHOLECYSTECTOMY N/A 06/26/2015   Procedure: LAPAROSCOPIC CHOLECYSTECTOMY;  Surgeon: [name], MD;  Location: [facility];  Service: General;  Laterality: N/A;   COLONOSCOPY WITH PROPOFOL N/A 05/07/2019   Procedure: COLONOSCOPY WITH PROPOFOL;  Surgeon: [name], MD;  Location: [facility];  Service: Gastroenterology;  Laterality: N/A;   CORONARY/GRAFT ACUTE MI REVASCULARIZATION N/A 11/07/2019   Procedure: Coronary/Graft Acute MI Revascularization;  Surgeon: [name], MD;  Location: [facility];  Service: Cardiovascular;  Laterality: N/A;   ESOPHAGOGASTRODUODENOSCOPY N/A 05/07/2019   Procedure: ESOPHAGOGASTRODUODENOSCOPY (EGD);  Surgeon: [name], MD;  Location: [facility] ENDOSCOPY;  Service: Gastroenterology;  Laterality: N/A;   HERNIA REPAIR  1979, 1981   x 2    LEFT HEART CATH AND CORONARY ANGIOGRAPHY N/A 11/07/2019   Procedure: LEFT HEART CATH AND CORONARY ANGIOGRAPHY;  Surgeon: [name], MD;  Location: [facility] [facility];  Service: Cardiovascular;  Laterality: N/A;   LITHOTRIPSY     multiple    Home Medications:  Allergies as of 02/19/2021       Reactions   Erythromycin    Made chest feel "funny"        Medication List         Accurate as of February 18, 2021 12:48 PM. If you have any questions, ask your nurse or doctor.          acetaminophen 325 MG tablet Commonly known as: TYLENOL Take 2 tablets (650 mg total) by mouth every 6 (six) hours as needed for mild pain (or Fever >/= 101).   amLODipine 5 MG tablet Commonly known as: NORVASC TAKE 1 TABLET BY MOUTH EVERY DAY   amphetamine-dextroamphetamine 10 MG tablet Commonly known as: ADDERALL Take 10 mg by mouth as needed.   atorvastatin 80 MG tablet Commonly known as: LIPITOR Take 1 tablet (80 mg total) by mouth daily.   Brilinta 90 MG Tabs tablet Generic drug: ticagrelor TAKE 1 TABLET BY MOUTH TWICE DAILY   buprenorphine 8 MG Subl SL tablet Commonly known as: SUBUTEX Place 20 mg under the tongue daily.   carvedilol 6.25 MG tablet Commonly known as: COREG TAKE 2 TABLETS(12.5 MG) BY MOUTH TWICE DAILY WITH A MEAL   cyanocobalamin 1000 MCG tablet Take 1 tablet (1,000 mcg total) by mouth daily.   linaclotide 145 MCG Caps capsule Commonly known as: Linzess Take 1 capsule (145 mcg total) by mouth daily before breakfast.   losartan 50 MG tablet Commonly known as: COZAAR Take 1 tablet (50 mg total) by mouth daily.   nitroGLYCERIN 0.4 MG SL tablet Commonly known as: NITROSTAT Place 1 tablet (0.4 mg total) under the tongue every 5 (five) minutes x 3  doses as needed for chest pain.   pantoprazole 40 MG tablet Commonly known as: PROTONIX Take 1 tablet (40 mg total) by mouth 2 (two) times daily.   predniSONE 10 MG tablet Commonly known as: DELTASONE Take 2 a day for 5 days, then 1 a day for 5 days, with food. Don't take with aleve/ibuprofen.   tamsulosin 0.4 MG Caps capsule Commonly known as: FLOMAX Take 1 capsule (0.4 mg total) by mouth daily.   testosterone cypionate 200 MG/ML injection Commonly known as: DEPOTESTOSTERONE CYPIONATE Inject 1 mL (200 mg total) into the muscle every 14 (fourteen) days.        Allergies:   Allergies  Allergen Reactions   Erythromycin     Made chest feel "funny"    Family History: Family History  Problem Relation Age of Onset   COPD Mother    Arthritis Mother    Asthma Mother    Lung cancer Mother        smoker   Early death Mother 105   Heart attack Mother    Heart disease Mother    Learning disabilities Mother    Miscarriages / Stillbirths Mother    Heart failure Father    Diabetes Father    Early death Father 65   Heart attack Father    Learning disabilities Father    Thyroid disease Neg Hx     Social History:  reports that he has never smoked. He quit smokeless tobacco use about 4 months ago.  His smokeless tobacco use included snuff. He reports current alcohol use. He reports that he does not use drugs.   Physical Exam: There were no vitals taken for this visit.  Constitutional:  Alert and oriented, No acute distress. HEENT: [place] AT, moist mucus membranes.  Trachea midline, no masses. Cardiovascular: No clubbing, cyanosis, or edema. Respiratory: Normal respiratory effort, no increased work of breathing. GI: Abdomen is soft, nontender, nondistended, no abdominal masses GU: No CVA tenderness Lymph: No cervical or inguinal lymphadenopathy. Skin: No rashes, bruises or suspicious lesions. Neurologic: Grossly intact, no focal deficits, moving all 4 extremities. Psychiatric: Normal mood and affect.  Laboratory Data: Lab Results  Component Value Date   WBC CANCELED 12/11/2020   WBC 6.3 12/11/2020   HGB 17.2 12/11/2020   HCT 50.5 12/11/2020   MCV 83 12/11/2020   PLT 295 12/11/2020    Lab Results  Component Value Date   CREATININE 0.97 12/11/2020   CREATININE 0.99 12/11/2020    No results found for: PSA  Lab Results  Component Value Date   TESTOSTERONE 554 12/11/2020   TESTOSTERONE 630 12/11/2020    Lab Results  Component Value Date   HGBA1C 4.6 (L) 11/07/2019    Urinalysis    Component Value Date/Time   COLORURINE YELLOW (A)  06/10/2015 1555   APPEARANCEUR CLEAR (A) 06/10/2015 1555   LABSPEC 1.013 06/10/2015 1555   PHURINE 7.0 06/10/2015 1555   GLUCOSEU 50 (A) 06/10/2015 1555   HGBUR 1+ (A) 06/10/2015 1555   BILIRUBINUR negative 12/20/2020 1559   KETONESUR negative 12/20/2020 1559   KETONESUR NEGATIVE 06/10/2015 1555   PROTEINUR negative 12/20/2020 1559   PROTEINUR NEGATIVE 06/10/2015 1555   UROBILINOGEN 0.2 12/20/2020 1559   NITRITE Negative 12/20/2020 1559   NITRITE NEGATIVE 06/10/2015 1555   LEUKOCYTESUR Negative 12/20/2020 1559    Lab Results  Component Value Date   BACTERIA NONE SEEN 06/10/2015    Pertinent Imaging: *** No results found for this or any previous visit.  No  results found for this or any previous visit.  No results found for this or any previous visit.  No results found for this or any previous visit.  No results found for this or any previous visit.  No results found for this or any previous visit.  No results found for this or any previous visit.  Results for orders placed during the hospital encounter of 06/10/15  CT RENAL STONE STUDY  Narrative CLINICAL DATA:  Right flank pain for the past 3 days. History of nephrolithiasis and hernia repairs.  EXAM: CT ABDOMEN AND PELVIS WITHOUT CONTRAST  TECHNIQUE: Multidetector CT imaging of the abdomen and pelvis was performed following the standard protocol without IV contrast.  COMPARISON:  10/15/2013.  FINDINGS: Lower chest:  No acute findings.  Hepatobiliary: No mass visualized on this un-enhanced exam.  Pancreas: No mass or inflammatory process identified on this un-enhanced exam.  Spleen: Within normal limits in size.  Small accessory splenule.  Adrenals/Urinary Tract: Normal appearing adrenal glands, kidneys and ureters. Mild diffuse bladder wall thickening without significant change. No urinary tract calculi or hydronephrosis.  Stomach/Bowel: Mildly prominent stool throughout the colon.  Normal appearing appendix. No visible gastric or small bowel abnormalities.  Vascular/Lymphatic: Minimal atheromatous calcifications. No enlarged lymph nodes.  Reproductive: Mildly enlarged prostate gland containing minimal calcification.  Other: Small left inguinal hernia containing fat. Small umbilical hernia containing fat.  Musculoskeletal: Lower lumbar and lower thoracic spine degenerative changes.  IMPRESSION: 1. No acute abnormality. 2. No urinary tract calculi or hydronephrosis. 3. Mildly enlarged prostate gland with associated mild diffuse bladder wall thickening, compatible with mild chronic bladder outlet obstruction, unchanged. 4. Small left inguinal hernia containing fat and small umbilical hernia containing fat.   Electronically Signed By: Claudie Revering M.D. On: 06/10/2015 19:32   Assessment & Plan:    There are no diagnoses linked to this encounter.  No follow-ups on file.  Abbie Sons, [place] [address] [place], [place] [address] [phone]

## 2021-02-19 ENCOUNTER — Ambulatory Visit: Payer: BC Managed Care – PPO | Admitting: Urology

## 2021-02-19 ENCOUNTER — Telehealth: Payer: Self-pay | Admitting: Family Medicine

## 2021-02-19 NOTE — Telephone Encounter (Signed)
Dr. Darnell Level prescribes atorvastatin.   Last rx was sent 12/20/20, #90/3.  Looks like Dr. Claiborne Billings at [facility] prescribes carvedilol.  Spoke with pt asking if he had contacted the pharmacy 1st requesting refills.  Pt states he had not.  I informed pt there are refills available on atorvastatin through 12/2021 and that carvedilol is prescribed by cardiology.  I also reminded pt to always call the pharmacy 1st to request refills.  Pt verbalizes understanding.

## 2021-02-19 NOTE — Telephone Encounter (Signed)
  Encourage patient to contact the pharmacy for refills or they can request refills through [facility]:  Please schedule appointment if longer than 1 year  NEXT APPOINTMENT DATE:  MEDICATION: carvedilol (COREG) 6.25 MG tablet,  atorvastatin (LIPITOR) 80 MG tablet  Is the patient out of medication? yes  PHARMACY: [facility]-  [address]  Let patient know to contact pharmacy at the end of the day to make sure medication is ready.  Please notify patient to allow 48-72 hours to process  CLINICAL FILLS OUT ALL BELOW:   LAST REFILL:  QTY:  REFILL DATE:    OTHER COMMENTS:    Okay for refill?  Please advise

## 2021-02-20 ENCOUNTER — Other Ambulatory Visit: Payer: BC Managed Care – PPO

## 2021-02-20 ENCOUNTER — Other Ambulatory Visit: Payer: Self-pay

## 2021-02-20 DIAGNOSIS — R972 Elevated prostate specific antigen [PSA]: Secondary | ICD-10-CM

## 2021-02-26 DIAGNOSIS — R972 Elevated prostate specific antigen [PSA]: Secondary | ICD-10-CM | POA: Diagnosis not present

## 2021-02-27 LAB — PSA: Prostate Specific Ag, Serum: 5.7 ng/mL — ABNORMAL HIGH (ref 0.0–4.0)

## 2021-02-28 ENCOUNTER — Encounter: Payer: Self-pay | Admitting: Urology

## 2021-02-28 ENCOUNTER — Other Ambulatory Visit: Payer: Self-pay

## 2021-02-28 ENCOUNTER — Ambulatory Visit: Payer: BC Managed Care – PPO | Admitting: Urology

## 2021-02-28 VITALS — BP 150/94 | HR 77 | Ht 66.0 in | Wt 185.0 lb

## 2021-02-28 DIAGNOSIS — N3281 Overactive bladder: Secondary | ICD-10-CM | POA: Diagnosis not present

## 2021-02-28 DIAGNOSIS — R7989 Other specified abnormal findings of blood chemistry: Secondary | ICD-10-CM | POA: Diagnosis not present

## 2021-02-28 DIAGNOSIS — R972 Elevated prostate specific antigen [PSA]: Secondary | ICD-10-CM | POA: Diagnosis not present

## 2021-02-28 DIAGNOSIS — N200 Calculus of kidney: Secondary | ICD-10-CM

## 2021-02-28 NOTE — Patient Instructions (Signed)
Will call with results

## 2021-02-28 NOTE — Progress Notes (Signed)
   02/28/2021 2:42 PM   Stephen Avila 28-Oct-1975 [id number]  Reason for visit: Follow up elevated PSA, testosterone replacement therapy, history of nephrolithiasis, urinary symptoms  HPI: 45 year old male who previously was followed by Dr. Bernardo Heater for the above issues, and requested to transfer his care to me.  His medical history is notable for CAD on anticoagulation.  I reviewed his prior urology notes from Dr. Bernardo Heater.  He has been on long-term testosterone replacement at least 4 to 5 years with significant improvement in his energy.  There were no prior PSA values to review, and he presented with an elevated PSA of 6.1, that remained 6.0 on retesting of the same specimen in June 2022.  He saw Dr. Bernardo Heater at that point who recommended a prostate MRI for further evaluation in the setting of his young age and elevated PSA on testosterone.  He also is having some urinary symptoms of urgency and frequency at that time and [place] recommended a trial of Flomax as well.  He never picked up or tried the Flomax.  He also has a history of kidney stones, and required ureteroscopy in 2015.  He is having some mild right-sided low back and groin pain, that he thinks is nerve related, but could potentially be kidney stones.  Urinalysis today is pending.  His urinary symptoms are primarily urgency when he gets out of his truck to void after holding it for a long time.  He drinks soda during the day.  We focused on behavioral strategies and timed voiding, and we will follow-up urinalysis from today.  Repeat PSA remained elevated at 5.7 on 02/26/2021 from the 6.0 previously.  I agree with Dr. Dene Gentry recommendation for a prostate MRI with the setting of his elevated PSA on testosterone.  We discussed other alternative would be holding testosterone for 1 to 2 months and repeating the PSA, but he does not want to come off testosterone unless absolutely necessary.  We discussed the relationship between  testosterone and prostate cancer, and the controversies.  We discussed possible need for prostate biopsy even if prostate MRI is normal if you would like to continue on testosterone, and I think he understands this relationship.  -Urinalysis and culture today, call with results, consider CT stone protocol if microscopic hematuria -Prostate MRI, call with results.  Even if negative would recommend biopsy with his young age, elevated PSA, and ongoing testosterone replacement   Billey Co, MD  [place] [address] [place], [name] [address] [phone]

## 2021-03-01 ENCOUNTER — Telehealth: Payer: Self-pay

## 2021-03-01 LAB — URINALYSIS, COMPLETE
Bilirubin, UA: NEGATIVE
Glucose, UA: NEGATIVE
Ketones, UA: NEGATIVE
Leukocytes,UA: NEGATIVE
Nitrite, UA: NEGATIVE
Protein,UA: NEGATIVE
RBC, UA: NEGATIVE
Specific Gravity, UA: 1.02 (ref 1.005–1.030)
Urobilinogen, Ur: 1 mg/dL (ref 0.2–1.0)
pH, UA: 6 (ref 5.0–7.5)

## 2021-03-01 LAB — MICROSCOPIC EXAMINATION
Bacteria, UA: NONE SEEN
RBC, Urine: NONE SEEN /hpf (ref 0–2)

## 2021-03-01 NOTE — Telephone Encounter (Signed)
Called pt no answer. Unable to LM on number listed per DPR. 1st attempt.

## 2021-03-01 NOTE — Telephone Encounter (Signed)
-----   Message from Billey Co, MD sent at 03/01/2021  8:25 AM EDT ----- Urinalysis is completely normal, no microscopic blood or infection.  Will call with MRI results of prostate  Nickolas Madrid, MD 03/01/2021

## 2021-03-01 NOTE — Telephone Encounter (Signed)
Incoming call on triage line from patient returning call. Informed patient of information below, he expressed understanding.

## 2021-03-03 LAB — CULTURE, URINE COMPREHENSIVE

## 2021-03-04 ENCOUNTER — Other Ambulatory Visit: Payer: Self-pay | Admitting: Cardiovascular Disease

## 2021-03-06 ENCOUNTER — Other Ambulatory Visit: Payer: Self-pay | Admitting: Urology

## 2021-03-06 DIAGNOSIS — R972 Elevated prostate specific antigen [PSA]: Secondary | ICD-10-CM

## 2021-04-02 ENCOUNTER — Ambulatory Visit: Payer: BC Managed Care – PPO

## 2021-04-10 DIAGNOSIS — M4716 Other spondylosis with myelopathy, lumbar region: Secondary | ICD-10-CM | POA: Diagnosis not present

## 2021-04-25 ENCOUNTER — Other Ambulatory Visit: Payer: Self-pay | Admitting: Family Medicine

## 2021-04-25 NOTE — Telephone Encounter (Signed)
Name of Medication: Testosterone cypionate inj Name of Pharmacy: [facility] Last Venida Jarvis or Written Date and Quantity: 11/30/20, #10 ml Last Office Visit and Type: 01/22/21, COVID+ Next Office Visit and Type: none Last Controlled Substance Agreement Date: none Last UDS: none

## 2021-04-27 NOTE — Telephone Encounter (Signed)
ERx 

## 2021-04-30 ENCOUNTER — Other Ambulatory Visit: Payer: Self-pay

## 2021-04-30 ENCOUNTER — Ambulatory Visit (INDEPENDENT_AMBULATORY_CARE_PROVIDER_SITE_OTHER): Payer: BC Managed Care – PPO | Admitting: Nurse Practitioner

## 2021-04-30 ENCOUNTER — Ambulatory Visit (INDEPENDENT_AMBULATORY_CARE_PROVIDER_SITE_OTHER): Payer: BC Managed Care – PPO

## 2021-04-30 ENCOUNTER — Telehealth: Payer: Self-pay

## 2021-04-30 VITALS — BP 148/92 | HR 105 | Temp 98.2°F | Resp 14 | Ht 64.0 in | Wt 185.4 lb

## 2021-04-30 DIAGNOSIS — R1031 Right lower quadrant pain: Secondary | ICD-10-CM

## 2021-04-30 DIAGNOSIS — M545 Low back pain, unspecified: Secondary | ICD-10-CM

## 2021-04-30 LAB — POCT URINALYSIS DIP (CLINITEK)
Bilirubin, UA: NEGATIVE
Blood, UA: NEGATIVE
Glucose, UA: 100 mg/dL — AB
Ketones, POC UA: NEGATIVE mg/dL
Leukocytes, UA: NEGATIVE
Nitrite, UA: NEGATIVE
Spec Grav, UA: >=1.03 — AB (ref 1.010–1.025)
Urobilinogen, UA: 0.2 E.U./dL
pH, UA: 5.5 (ref 5.0–8.0)

## 2021-04-30 MED ORDER — PREDNISONE 20 MG PO TABS: ORAL_TABLET | ORAL | 0 refills | Status: AC

## 2021-04-30 NOTE — Telephone Encounter (Signed)
Per appt notes pt already has appt 04/30/21 at 2:20 with Romilda Garret NP; alos pt care advice given in access nurse note if condition changes or worsens. Sending note to Romilda Garret NP and Anastasiya CMA.

## 2021-04-30 NOTE — Patient Instructions (Signed)
Nice to see you Will be in touch regarding xray

## 2021-04-30 NOTE — Assessment & Plan Note (Signed)
Describes pain in right lower back that bores through to his front abdomen groin area also travels down his leg cannot elicit on exam negative straight leg raise patient does have history of herniated lumbar disks per his report we will update lumbar x-ray is try a steroid Dosepak to see if patient responds.  Patient acknowledges treatment plan is in agreement.  Continue to monitor pending x-ray result

## 2021-04-30 NOTE — Assessment & Plan Note (Signed)
Unclear etiology.  Was unable to fill hernia on exam no lymphadenopathy no testicular pain no change in urinary symptoms per patient's baseline but he is dealing with urology.  Pending x-ray result and urine culture.

## 2021-04-30 NOTE — Telephone Encounter (Signed)
Stephen Avila - Client TELEPHONE ADVICE RECORD AccessNurse Patient Name: Stephen Avila [place] RLEY Gender: Unknown DOB: 05/01/1976 Age: 45 Y 52 M 29 D Return Phone Number: [phone] (Primary) Address: City/ State/ Zip: [place]  [address] Client [place] Avila - Client Client Site [place] Physician [name] - MD Contact Type Call Who Is Calling Patient / Member / Family / Caregiver Call Type Triage / Clinical Caller Name [facility] Relationship To Patient Partner Return Phone Number [phone] (Primary) Chief Complaint Leg Pain Reason for Call Symptomatic / Request for Health Information Initial Comment Caller states patient has pain in his groin leading down to his back and leg on the left side. Translation No Nurse Assessment Nurse: D'Heur Lucia Gaskins, RN, [place] Date/Time (Eastern Time): 04/30/2021 8:51:13 AM Confirm and document reason for call. If symptomatic, describe symptoms. ---Caller states patient has pain in his groin leading down to his back and leg on the left side. He pulled his back out middle of last week. He is a courier & lifts boxes at work. Does the patient have any new or worsening symptoms? ---Yes Will a triage be completed? ---Yes Related visit to physician within the last 2 weeks? ---No Does the PT have any chronic conditions? (i.e. diabetes, asthma, this includes High risk factors for pregnancy, etc.) ---Yes List chronic conditions. ---Hx of sciatica, Hx of MI, HTN Is the patient pregnant or possibly pregnant? (Ask all females between the ages of 3-55) ---No Is this a behavioral health or substance abuse call? ---No Guidelines Guideline Title Affirmed Question Affirmed Notes Nurse Date/Time (Eastern Time) Back Pain [1] SEVERE back pain (e.g., excruciating, unable to do any normal activities) AND [2] Bowen, RN, [place] 04/30/2021  8:54:05 AM PLEASE NOTE: All timestamps contained within this report are represented as [place] Standard Time. CONFIDENTIALTY NOTICE: This fax transmission is intended only for the addressee. It contains information that is legally privileged, confidential or otherwise protected from use or disclosure. If you are not the intended recipient, you are strictly prohibited from reviewing, disclosing, copying using or disseminating any of this information or taking any action in reliance on or regarding this information. If you have received this fax in error, please notify us immediately by telephone so that we can arrange for its return to [place]. Phone: [phone], Toll-Free: [phone], Fax: [phone] Page: 2 of 2 Call Id: [id number] Guidelines Guideline Title Affirmed Question Affirmed Notes Nurse Date/Time Stephen Avila Time) not improved 2 hours after pain medicine Disp. Time Stephen Avila Time) Disposition Final User 04/30/2021 8:59:22 AM See HCP within 4 Hours (or PCP triage) Yes D'Heur Lucia Gaskins, RN, Ebbie Latus Disagree/Comply Comply Caller Understands Yes PreDisposition Call Doctor Care Advice Given Per Guideline SEE HCP (OR PCP TRIAGE) WITHIN 4 HOURS: * IF OFFICE WILL BE OPEN: You need to be seen within the next 3 or 4 hours. Call your doctor (or NP/PA) now or as soon as the office opens. * ACETAMINOPHEN - EXTRA STRENGTH TYLENOL: Take 1,000 mg (two 500 mg pills) every 6 to 8 hours as needed. Each Extra Strength Tylenol pill has 500 mg of acetaminophen. The most you should take is 6 pills a Avila (3,000 mg total). Note: In [place], the maximum is 8 pills a Avila (4,000 mg total). * IBUPROFEN (E.G., MOTRIN, ADVIL): Take 400 mg (two 200 mg pills) by mouth every 6 hours. The most you should take is 6 pills a Avila (1,200 mg total). CALL BACK  IF: * You become worse CARE ADVICE given per Back Pain (Adult) guideline. Comments User: Vincente Liberty, D'Heur Lucia Gaskins, RN Date/Time Stephen Avila Time): 04/30/2021  8:57:06 AM Caller has chronic urination issues (enlarged prostate). User: Vincente Liberty, D'Heur Lucia Gaskins, RN Date/Time Stephen Avila Time): 04/30/2021 9:05:19 AM Warm transferred to office as per directives. Referrals REFERRED TO PCP OFFICE Warm transfer to backlin

## 2021-04-30 NOTE — Progress Notes (Signed)
Acute Office Visit  Subjective:    Patient ID: Stephen Avila, male    DOB: Feb 24, 1976, 45 y.o.   MRN: [id number]  Chief Complaint  Patient presents with   Groin Pain    Right side, started around 04/25/2021. Pain radiating to the lower right back area and right leg. Was diagnosed with elevated PSA recently. Hx kidney stones. Pain is present a lot of time with standing up.      Patient is in today for Groin pain   History of elevated PSA, kidney stones, CAD with antiplatelet use  Symptoms started on 04/25/2021  Wednesday night he got off the couh and twisted his ower back. As the time went on the pain would be right side pack straight through to his groin. States it would go down his right leg that stopped at his knee then progressed to his lower leg. Described as a pinching feeling. Pinching and pulling  Hx of herniated discs per patient report.  Also elevated PSA with pending MRI  Past Medical History:  Diagnosis Date   Anxiety    Constipation    GERD (gastroesophageal reflux disease)    Heart murmur    History of chicken pox    Hypertension    Kidney stones    PUD (peptic ulcer disease)     Past Surgical History:  Procedure Laterality Date   CHOLECYSTECTOMY N/A 06/26/2015   Procedure: LAPAROSCOPIC CHOLECYSTECTOMY;  Surgeon: [name], MD;  Location: [facility];  Service: General;  Laterality: N/A;   COLONOSCOPY WITH PROPOFOL N/A 05/07/2019   Procedure: COLONOSCOPY WITH PROPOFOL;  Surgeon: [name], MD;  Location: [facility];  Service: Gastroenterology;  Laterality: N/A;   CORONARY/GRAFT ACUTE MI REVASCULARIZATION N/A 11/07/2019   Procedure: Coronary/Graft Acute MI Revascularization;  Surgeon: [name], MD;  Location: [place];  Service: Cardiovascular;  Laterality: N/A;   ESOPHAGOGASTRODUODENOSCOPY N/A 05/07/2019   Procedure: ESOPHAGOGASTRODUODENOSCOPY (EGD);  Surgeon: [name], MD;  Location: [facility] ENDOSCOPY;  Service:  Gastroenterology;  Laterality: N/A;   HERNIA REPAIR  1979, 1981   x 2    LEFT HEART CATH AND CORONARY ANGIOGRAPHY N/A 11/07/2019   Procedure: LEFT HEART CATH AND CORONARY ANGIOGRAPHY;  Surgeon: [name], MD;  Location: [place];  Service: Cardiovascular;  Laterality: N/A;   LITHOTRIPSY     multiple    Family History  Problem Relation Age of Onset   COPD Mother    Arthritis Mother    Asthma Mother    Lung cancer Mother        smoker   Early death Mother 65   Heart attack Mother    Heart disease Mother    Learning disabilities Mother    Miscarriages / Stillbirths Mother    Heart failure Father    Diabetes Father    Early death Father 4   Heart attack Father    Learning disabilities Father    Thyroid disease Neg Hx     Social History   Socioeconomic History   Marital status: Divorced    Spouse name: Not on file   Number of children: Not on file   Years of education: Not on file   Highest education level: Not on file  Occupational History   Not on file  Tobacco Use   Smoking status: Never   Smokeless tobacco: Former    Types: Snuff    Quit date: 09/27/2020  Substance and Sexual Activity   Alcohol use:  Yes    Comment: occasional   Drug use: No   Sexual activity: Yes    Partners: Female  Other Topics Concern   Not on file  Social History Narrative   Friend of Michaela Corner   Right handed   Lives alone, dog   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     amLODipine (NORVASC) 5  MG tablet TAKE 1 TABLET BY MOUTH EVERY DAY 90 tablet 1   amphetamine-dextroamphetamine (ADDERALL) 10 MG tablet Take 10 mg by mouth as needed.     atorvastatin (LIPITOR) 80 MG tablet Take 1 tablet (80 mg total) by mouth daily. 90 tablet 3   BRILINTA 90 MG TABS tablet TAKE 1 TABLET BY MOUTH TWICE DAILY 180 tablet 1   buprenorphine (SUBUTEX) 8 MG SUBL SL tablet Place 20 mg under the tongue daily.      carvedilol (COREG) 6.25 MG tablet TAKE 2 TABLETS(12.5 MG) BY MOUTH TWICE DAILY WITH A MEAL 180 tablet 2   linaclotide (LINZESS) 145 MCG CAPS capsule Take 1 capsule (145 mcg total) by mouth daily before breakfast. 30 capsule 3   losartan (COZAAR) 50 MG tablet Take 1 tablet (50 mg total) by mouth daily. PATIENT NEEDS TO SCHEDULE FOLLOW UP APPOINTMENT TO RECEIVE FUTURE REFILLS. 90 tablet 0   nitroGLYCERIN (NITROSTAT) 0.4 MG SL tablet Place 1 tablet (0.4 mg total) under the tongue every 5 (five) minutes x 3 doses as needed for chest pain. 25 tablet 3   pantoprazole (PROTONIX) 40 MG tablet Take 1 tablet (40 mg total) by mouth 2 (two) times daily. 180 tablet 3   sertraline (ZOLOFT) 25 MG tablet Take by mouth.     testosterone cypionate (DEPOTESTOSTERONE CYPIONATE) 200 MG/ML injection INJECT 1 ML (200 MG TOTAL) INTO THE MUSCLE EVERY 14 (FOURTEEN) DAYS. 10 mL 0   vitamin B-12 1000 MCG tablet Take 1 tablet (1,000 mcg total) by mouth daily. 30 tablet 0   No facility-administered medications prior to visit.    Allergies  Allergen Reactions   Erythromycin     Made chest feel "funny"    Review of Systems  Constitutional:  Positive for fatigue. Negative for chills and fever.  Respiratory:  Negative for cough and shortness of breath.   Cardiovascular:  Negative for chest pain.  Gastrointestinal:  Negative for diarrhea, nausea and vomiting.  Genitourinary:  Negative for dysuria.       Stream issues that he is dealing with prior to this incident with urology   Musculoskeletal:  Positive for back pain.   Neurological:  Positive for weakness (subjective) and numbness (maybe in the right leg).      Objective:    Physical Exam Vitals and nursing note reviewed. Exam conducted with a chaperone present [facility], CMA).  Constitutional:      Appearance: Normal appearance.  Cardiovascular:     Rate and Rhythm: Normal rate and regular rhythm.  Pulmonary:     Effort: Pulmonary effort  is normal.     Breath sounds: Normal breath sounds.  Abdominal:     General: Bowel sounds are normal. There is no distension.     Palpations: There is no mass.     Tenderness: There is no abdominal tenderness.     Hernia: No hernia is present.  Genitourinary:    Penis: Normal and circumcised.      Testes: Normal.     Comments: No inguinal lymphadenopathy. No testicular pain or swelling/erythema. No penile pain or discharge.  No hernia felt on exam Musculoskeletal:        General: No tenderness.     Thoracic back: No tenderness or bony tenderness.     Lumbar back: No tenderness or bony tenderness. Negative right straight leg raise test and negative left straight leg raise test.     Right lower leg: No edema.     Left lower leg: No edema.  Neurological:     Mental Status: He is alert.     Sensory: Sensation is intact.     Deep Tendon Reflexes:     Reflex Scores:      Bicep reflexes are 2+ on the right side and 2+ on the left side.      Patellar reflexes are 2+ on the right side and 2+ on the left side.    Comments: Bilateral upper and lower strength 5/5    BP (!) 148/92   Pulse (!) 105   Temp 98.2 F (36.8 C)   Resp 14   Ht '5\' 4"'  (1.626 m)   Wt 185 lb 6 oz (84.1 kg)   SpO2 99%   BMI 31.82 kg/m  Wt Readings from Last 3 Encounters:  04/30/21 185 lb 6 oz (84.1 kg)  02/28/21 185 lb (83.9 kg)  01/22/21 185 lb (83.9 kg)    Health Maintenance Due  Topic Date Due   COVID-19 Vaccine (1) Never done   Pneumococcal Vaccine 59-31 Years old (1 - PCV) Never done   Hepatitis C Screening  Never  done   TETANUS/TDAP  Never done   INFLUENZA VACCINE  Never done    There are no preventive care reminders to display for this patient.   Lab Results  Component Value Date   TSH 0.107 (L) 12/11/2020   TSH 0.104 (L) 12/11/2020   Lab Results  Component Value Date   WBC CANCELED 12/11/2020   WBC 6.3 12/11/2020   HGB 17.2 12/11/2020   HCT 50.5 12/11/2020   MCV 83 12/11/2020   PLT 295 12/11/2020   Lab Results  Component Value Date   NA 141 12/11/2020   NA 141 12/11/2020   K 4.0 12/11/2020   K 4.0 12/11/2020   CO2 24 12/11/2020   CO2 24 12/11/2020   GLUCOSE 95 12/11/2020   GLUCOSE 94 12/11/2020   BUN 9 12/11/2020   BUN 9 12/11/2020   CREATININE 0.97 12/11/2020   CREATININE 0.99 12/11/2020   BILITOT 0.4 12/11/2020   BILITOT 0.4 12/11/2020   ALKPHOS 97 12/11/2020   ALKPHOS 94 12/11/2020   AST 17 12/11/2020   AST 15 12/11/2020   ALT 14 12/11/2020   ALT 14 12/11/2020   PROT 7.3 12/11/2020   PROT 7.2 12/11/2020   ALBUMIN 4.2 12/11/2020   ALBUMIN 4.0 12/11/2020   CALCIUM 9.5 12/11/2020   CALCIUM 9.4 12/11/2020   ANIONGAP 3 (L) 11/08/2019   EGFR 98 12/11/2020   EGFR 96 12/11/2020   Lab Results  Component Value Date   CHOL  181 12/11/2020   CHOL 184 12/11/2020   Lab Results  Component Value Date   HDL 40 12/11/2020   HDL 40 12/11/2020   Lab Results  Component Value Date   LDLCALC 122 (H) 12/11/2020   LDLCALC 124 (H) 12/11/2020   Lab Results  Component Value Date   TRIG 102 12/11/2020   TRIG 110 12/11/2020   Lab Results  Component Value Date   CHOLHDL 4.5 12/11/2020   CHOLHDL 4.6 12/11/2020   Lab Results  Component Value Date   HGBA1C 4.6 (L) 11/07/2019       Assessment & Plan:   Problem List Items Addressed This Visit       Other   Acute right-sided low back pain    Describes pain in right lower back that bores through to his front abdomen groin area also travels down his leg cannot elicit on exam negative straight leg raise patient does  have history of herniated lumbar disks per his report we will update lumbar x-ray is try a steroid Dosepak to see if patient responds.  Patient acknowledges treatment plan is in agreement.  Continue to monitor pending x-ray result      Relevant Medications   predniSONE (DELTASONE) 20 MG tablet   Right inguinal pain - Primary    Unclear etiology.  Was unable to fill hernia on exam no lymphadenopathy no testicular pain no change in urinary symptoms per patient's baseline but he is dealing with urology.  Pending x-ray result and urine culture.      Relevant Orders   POCT URINALYSIS DIP (CLINITEK) (Completed)   Urine Culture   DG Lumbar Spine Complete (Completed)     No orders of the defined types were placed in this encounter.  This visit occurred during the SARS-CoV-2 public health emergency.  Safety protocols were in place, including screening questions prior to the visit, additional usage of staff PPE, and extensive cleaning of exam room while observing appropriate contact time as indicated for disinfecting solutions.   Romilda Garret, NP

## 2021-05-01 LAB — URINE CULTURE
MICRO NUMBER:: 12542061
Result:: NO GROWTH
SPECIMEN QUALITY:: ADEQUATE

## 2021-05-02 ENCOUNTER — Other Ambulatory Visit: Payer: Self-pay | Admitting: Nurse Practitioner

## 2021-05-02 ENCOUNTER — Telehealth: Payer: Self-pay | Admitting: Family Medicine

## 2021-05-02 ENCOUNTER — Telehealth: Payer: Self-pay

## 2021-05-02 DIAGNOSIS — M5136 Other intervertebral disc degeneration, lumbar region: Secondary | ICD-10-CM

## 2021-05-02 DIAGNOSIS — G8929 Other chronic pain: Secondary | ICD-10-CM

## 2021-05-02 DIAGNOSIS — M5441 Lumbago with sciatica, right side: Secondary | ICD-10-CM

## 2021-05-02 NOTE — Progress Notes (Signed)
amb  

## 2021-05-02 NOTE — Telephone Encounter (Signed)
Since Romilda Garret, NP is out of the office , will see if Dr G can put this referral in. Please review. Patient could possibly get an appointment next week if they get the referral sent over.

## 2021-05-02 NOTE — Telephone Encounter (Signed)
PA submitted through covermymeds. [place] response. Patient has been on this regimen since 2015. KEYErven Colla  PA Case ID: [id number] - Rx #: [id number]

## 2021-05-02 NOTE — Telephone Encounter (Signed)
Pt  girlfriend [name] called stating that pt would like a referral to Neuro surgeon Dr Kary Kos with [facility]. ASAP [phone]

## 2021-05-02 NOTE — Telephone Encounter (Signed)
Thanks for taking care of that for me

## 2021-05-02 NOTE — Telephone Encounter (Signed)
New referral placed to neurosurgery

## 2021-05-10 NOTE — Telephone Encounter (Signed)
Per CoverMyMeds, PA approved; valid through 05/02/2022.  Made pharmacy aware.

## 2021-05-14 ENCOUNTER — Other Ambulatory Visit: Payer: Self-pay

## 2021-05-14 ENCOUNTER — Ambulatory Visit
Admission: RE | Admit: 2021-05-14 | Discharge: 2021-05-14 | Disposition: A | Discharge status: Home / Self Care | Payer: BC Managed Care – PPO | Source: Ambulatory Visit | Attending: Urology | Admitting: Urology

## 2021-05-14 DIAGNOSIS — R590 Localized enlarged lymph nodes: Secondary | ICD-10-CM | POA: Diagnosis not present

## 2021-05-14 DIAGNOSIS — R972 Elevated prostate specific antigen [PSA]: Secondary | ICD-10-CM | POA: Diagnosis not present

## 2021-05-14 DIAGNOSIS — R350 Frequency of micturition: Secondary | ICD-10-CM | POA: Diagnosis not present

## 2021-05-14 MED ORDER — GADOBUTROL 1 MMOL/ML IV SOLN
8.0000 mL | Freq: Once | INTRAVENOUS | Status: AC | PRN
  Administered 2021-05-14: 8 mL via INTRAVENOUS

## 2021-05-15 ENCOUNTER — Telehealth: Payer: Self-pay

## 2021-05-15 DIAGNOSIS — R9389 Abnormal findings on diagnostic imaging of other specified body structures: Secondary | ICD-10-CM

## 2021-05-15 NOTE — Telephone Encounter (Signed)
-----   Message from Billey Co, MD sent at 05/15/2021  8:28 AM EST ----- Prostate MRI shows a suspicious lesion in the prostate that will need to be biopsied.  Please review prostate biopsy instructions, and place referral to [facility] for MRI fusion biopsy, they can also give him some sedation for the biopsy there that we cannot offer  Nickolas Madrid, MD 05/15/2021

## 2021-05-15 NOTE — Telephone Encounter (Signed)
Called pt informed him of the information below. Pt voiced understanding. Referral placed.

## 2021-05-17 DIAGNOSIS — M5416 Radiculopathy, lumbar region: Secondary | ICD-10-CM | POA: Diagnosis not present

## 2021-05-18 ENCOUNTER — Telehealth: Payer: Self-pay | Admitting: Cardiovascular Disease

## 2021-05-18 MED ORDER — LOSARTAN POTASSIUM 50 MG PO TABS: 50.0000 mg | ORAL_TABLET | Freq: Every day | ORAL | 0 refills | Status: DC

## 2021-05-18 NOTE — Telephone Encounter (Signed)
*  STAT* If patient is at the pharmacy, call can be transferred to refill team.   1. Which medications need to be refilled? (please list name of each medication and dose if known)   losartan (COZAAR) 50 MG tablet    2. Which pharmacy/location (including street and city if local pharmacy) is medication to be sent to? [facility] [place], [place]  3. Do they need a 30 day or 90 day supply? 90 day   Patient is out of medication

## 2021-05-18 NOTE — Telephone Encounter (Signed)
Refills has been sent to the pharmacy. 

## 2021-05-23 ENCOUNTER — Telehealth: Payer: Self-pay

## 2021-05-23 NOTE — Telephone Encounter (Signed)
Cardiac clearance form faxed to patient's cardiologist for request to d/c Brilinta 7 days prior to fusion biopsy. Awaiting response.

## 2021-05-30 ENCOUNTER — Telehealth: Payer: Self-pay | Admitting: Urology

## 2021-05-30 ENCOUNTER — Telehealth: Payer: Self-pay | Admitting: Cardiovascular Disease

## 2021-05-30 ENCOUNTER — Telehealth: Payer: Self-pay | Admitting: Family Medicine

## 2021-05-30 NOTE — Telephone Encounter (Signed)
Hi Dr. Claiborne Billings,   Stephen Avila has upcoming prostate biopsy planned and will need to hold Brilinta. He has a history of CAD with STEMI in 11/2019 s/p DES to OM2. Echo at that time showed normal LV function. He was last seen by you in 05/2020 at which time he was doing well from a cardiac standpoint with no recurrent chest pain. Can patient hold Brilinta for 7 days prior to biopsy?  Please route response back to [facility] PREOP.  Thank you! Siera Beyersdorf

## 2021-05-30 NOTE — Telephone Encounter (Signed)
[facility] they are changing Brilinta from 7 days to 5 days.

## 2021-05-30 NOTE — Telephone Encounter (Signed)
Pt.'s wife called and stated pt. Has not been called to schedule his fusion biopsy because he has not received Cardiology Clearance and she is very upset. There is a telephone note in that says Cardiology Clearance TBD dated today 05/30/21.

## 2021-05-30 NOTE — Telephone Encounter (Signed)
   [facility] Pre-operative Risk Assessment    Patient Name: Stephen Avila  DOB: [birth year] MRN: [id number]  HEARTCARE STAFF:  - IMPORTANT!!!!!! Under Visit Info/Reason for Call, type in Other and utilize the format Clearance MM/DD/YY or Clearance TBD. Do not use dashes or single digits. - Please review there is not already an duplicate clearance open for this procedure. - If request is for dental extraction, please clarify the # of teeth to be extracted. - If the patient is currently at the dentist's office, call Pre-Op Callback Staff (MA/nurse) to input urgent request.  - If the patient is not currently in the dentist office, please route to the Pre-Op pool.  Request for surgical clearance:  What type of surgery is being performed? Fusion Prostate Biopsy   When is this surgery scheduled? TBD  What type of clearance is required (medical clearance vs. Pharmacy clearance to hold med vs. Both)? Both  Are there any medications that need to be held prior to surgery and how long? Brilinta 7 days prior proceeding one day after procedure   Practice name and name of physician performing surgery? [facility]/ Dr. Nickolas Madrid  What is the office phone number? [phone]   7.   What is the office fax number? [phone]  8.   Anesthesia type (None, local, MAC, general) ? none   Stephen Avila 05/30/2021, 3:42 PM  _________________________________________________________________   (provider comments below)

## 2021-05-30 NOTE — Telephone Encounter (Signed)
Pt girlfriend called in stating that pt has increased anxiety. Girlfriend denies any depression or suicidal thought and pt is able to perform normal daily function.Scheduled appt with Dr Darnell Level on 06/04/21(Monday).Mandy [employer] banker, corporate) agreed with plan.

## 2021-06-04 ENCOUNTER — Other Ambulatory Visit: Payer: Self-pay

## 2021-06-04 ENCOUNTER — Encounter: Payer: Self-pay | Admitting: Family Medicine

## 2021-06-04 ENCOUNTER — Ambulatory Visit: Payer: BC Managed Care – PPO | Admitting: Family Medicine

## 2021-06-04 VITALS — BP 128/86 | HR 91 | Temp 98.0°F | Ht 64.0 in | Wt 186.6 lb

## 2021-06-04 DIAGNOSIS — E230 Hypopituitarism: Secondary | ICD-10-CM

## 2021-06-04 DIAGNOSIS — E038 Other specified hypothyroidism: Secondary | ICD-10-CM | POA: Diagnosis not present

## 2021-06-04 DIAGNOSIS — F419 Anxiety disorder, unspecified: Secondary | ICD-10-CM | POA: Diagnosis not present

## 2021-06-04 DIAGNOSIS — R972 Elevated prostate specific antigen [PSA]: Secondary | ICD-10-CM

## 2021-06-04 DIAGNOSIS — I10 Essential (primary) hypertension: Secondary | ICD-10-CM

## 2021-06-04 DIAGNOSIS — K279 Peptic ulcer, site unspecified, unspecified as acute or chronic, without hemorrhage or perforation: Secondary | ICD-10-CM

## 2021-06-04 DIAGNOSIS — F411 Generalized anxiety disorder: Secondary | ICD-10-CM | POA: Insufficient documentation

## 2021-06-04 DIAGNOSIS — E291 Testicular hypofunction: Secondary | ICD-10-CM

## 2021-06-04 DIAGNOSIS — R61 Generalized hyperhidrosis: Secondary | ICD-10-CM | POA: Diagnosis not present

## 2021-06-04 MED ORDER — DULOXETINE HCL 30 MG PO CPEP: 30.0000 mg | ORAL_CAPSULE | Freq: Every day | ORAL | 3 refills | Status: DC

## 2021-06-04 MED ORDER — CYANOCOBALAMIN 1000 MCG PO TABS: 1000.0000 ug | ORAL_TABLET | ORAL | 0 refills | Status: DC

## 2021-06-04 NOTE — Assessment & Plan Note (Signed)
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

## 2021-06-04 NOTE — Assessment & Plan Note (Signed)
Notes longstanding history of anxiety, previously did not respond to zoloft 75mg  daily. In h/o chronic pain, will start cymbalta 30mg  daily, update with effect after 1 month. RTC 3 mo f/u visit.

## 2021-06-04 NOTE — Assessment & Plan Note (Signed)
Refer back to endo per pt request.

## 2021-06-04 NOTE — Assessment & Plan Note (Signed)
He's been off pantoprazole without recurrent symptoms.

## 2021-06-04 NOTE — Patient Instructions (Addendum)
Start cymbalta 30mg  daily.  Call neurosurgery about gabapentin refill, let me know if any trouble filling.  We will refer you back to [facility] endocrinology.  Return in 3-4 months for physical.

## 2021-06-04 NOTE — Telephone Encounter (Signed)
Left message for surgery scheduler that we were calling to confirm that their office did receive the clearance notes faxed over about 20+ minutes AGO. I will re-fax notes that I left message about clearance. Please call our office if you have not received the clearance notes.

## 2021-06-04 NOTE — Telephone Encounter (Signed)
   Name: Stephen Avila  DOB: 09/13/1975  MRN: [id number]   Primary Cardiologist: Shelva Majestic, MD  Chart reviewed as part of pre-operative protocol coverage. Patient was contacted 06/04/2021 in reference to pre-operative risk assessment for pending surgery as outlined below.  Stephen Avila was last seen on 06/05/20 by Dr. Claiborne Billings.  Since that day, Stephen Avila has done well.  He has a history of STEMI with residual CAD, maintained on brilinta monotherapy. ASA was stopped for history of GI bleeding.  He can complete 4.0 METS without angina (walks 1 mile, can climb a flight of stairs).  Per direct verbal communication with Dr. Claiborne Billings, [place] to hold brilinta prior to surgery and restart as soon after when safe to do so.  Therefore, based on ACC/AHA guidelines, the patient would be at acceptable risk for the planned procedure without further cardiovascular testing.   The patient was advised that if he develops new symptoms prior to surgery to contact our office to arrange for a follow-up visit, and he verbalized understanding.  I will route this recommendation to the requesting party via Epic fax function and remove from pre-op pool. Please call with questions.  Tami Lin Jaun Galluzzo, PA 06/04/2021, 2:54 PM

## 2021-06-04 NOTE — Assessment & Plan Note (Signed)
Continues testosterone pending prostate biopsy.

## 2021-06-04 NOTE — Assessment & Plan Note (Signed)
Chronic, stable on current regimen - continue. 

## 2021-06-04 NOTE — Assessment & Plan Note (Addendum)
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 This limits his ability to be active, worse with exertion, worse with stress/anxiety. 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  He hasn't noted improvement while on prednisone, gabapentin, or zoloft. Will refer back to endo. Will trial cymbalta as per below in interim.

## 2021-06-04 NOTE — Progress Notes (Signed)
Patient ID: Stephen Avila, male    DOB: [birth year], 45 y.o.   MRN: [id number]  This visit was conducted in person.  BP 128/86 (BP Location: Right Arm, Cuff Size: Large)   Pulse 91   Temp 98 F (36.7 C) (Temporal)   Ht 5\' 4"  (1.626 m)   Wt 186 lb 9 oz (84.6 kg)   SpO2 98%   BMI 32.02 kg/m    CC: anxiety  Subjective:   HPI: Stephen Avila is a 45 y.o. male presenting on 06/04/2021 for Anxiety (C/o feeling anxious. States he'll have episodes of profuse sweating and then will subside.  States he has a lot on his mind. )   I last saw patient virtually 01/2021 for COVID infection.  Overdue for CPE.  BP elevated today - he just took BP meds (amlodipine, carvedilol and losartan).   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 Has previously seen endo Loanne Drilling and [facility]), last eval was 10/2019 at [facility] - at that time thought hypothalamus issue.   Upcoming prostate biopsy for abnormal prostate MRI ([facility]).  Known CAD h/o STEMI 2015 with HTN on brilinta, lipitor, amlodipine, carvedilol, and losartan.   Main concern today is ongoing chronic sweating predominantly to upper back and upper chest, as well as increased worrying/anxiety about prostate issues. Notes sweating is worse with exertion.  Diaphoresis limits ability to be active.  Previously tried sertraline up to 75mg  with no benefit.   Off PPI - no GERD.  Continues adderall 10mg  PRN ADHD prescribed by Dr Suzie Portela in [facility] (Pain management). Buprenorphine also through this office.   Recently saw neurosurgery Dr Saintclair Halsted planning lumbar MRI, received prednisone course and gabapentin 300mg  TID - notes no change with sweating while on these medications.      Relevant past medical, surgical, family and social history reviewed and updated as indicated. Interim medical history since our last visit reviewed. Allergies and medications reviewed and  updated. Outpatient Medications Prior to Visit  Medication Sig Dispense Refill   acetaminophen (TYLENOL) 325 MG tablet Take 2 tablets (650 mg total) by mouth every 6 (six) hours as needed for mild pain (or Fever >/= 101).     amLODipine (NORVASC) 5 MG tablet TAKE 1 TABLET BY MOUTH EVERY DAY 90 tablet 1   amphetamine-dextroamphetamine (ADDERALL) 10 MG tablet Take 10 mg by mouth as needed.     atorvastatin (LIPITOR) 80 MG tablet Take 1 tablet (80 mg total) by mouth daily. 90 tablet 3   BRILINTA 90 MG TABS tablet TAKE 1 TABLET BY MOUTH TWICE DAILY 180 tablet 1   buprenorphine (SUBUTEX) 8 MG SUBL SL tablet Place 20 mg under the tongue daily.      carvedilol (COREG) 6.25 MG tablet TAKE 2 TABLETS(12.5 MG) BY MOUTH TWICE DAILY WITH A MEAL 180 tablet 2   gabapentin (NEURONTIN) 300 MG capsule Take 1 capsule (300 mg total) by mouth 3 (three) times daily.     linaclotide (LINZESS) 145 MCG CAPS capsule Take 1 capsule (145 mcg total) by mouth daily before breakfast. 30 capsule 3   losartan (COZAAR) 50 MG tablet Take 1 tablet (50 mg total) by mouth daily. 90 tablet 0   nitroGLYCERIN (NITROSTAT) 0.4 MG SL tablet Place 1 tablet (0.4 mg total) under the tongue every 5 (five) minutes x 3 doses as needed for chest pain. 25 tablet 3   testosterone cypionate (DEPOTESTOSTERONE CYPIONATE) 200 MG/ML injection INJECT 1 ML (200  MG TOTAL) INTO THE MUSCLE EVERY 14 (FOURTEEN) DAYS. 10 mL 0   pantoprazole (PROTONIX) 40 MG tablet Take 1 tablet (40 mg total) by mouth 2 (two) times daily. 180 tablet 3   sertraline (ZOLOFT) 25 MG tablet Take by mouth.     vitamin B-12 1000 MCG tablet Take 1 tablet (1,000 mcg total) by mouth daily. 30 tablet 0   No facility-administered medications prior to visit.     Per HPI unless specifically indicated in ROS section below Review of Systems  Objective:  BP 128/86 (BP Location: Right Arm, Cuff Size: Large)   Pulse 91   Temp 98 F (36.7 C) (Temporal)   Ht 5\' 4"  (1.626 m)   Wt 186 lb 9  oz (84.6 kg)   SpO2 98%   BMI 32.02 kg/m   Wt Readings from Last 3 Encounters:  06/04/21 186 lb 9 oz (84.6 kg)  04/30/21 185 lb 6 oz (84.1 kg)  02/28/21 185 lb (83.9 kg)      Physical Exam Vitals and nursing note reviewed.  Constitutional:      Appearance: Normal appearance. He is not ill-appearing.  Eyes:     Extraocular Movements: Extraocular movements intact.     Conjunctiva/sclera: Conjunctivae normal.     Pupils: Pupils are equal, round, and reactive to light.  Cardiovascular:     Rate and Rhythm: Normal rate and regular rhythm.     Pulses: Normal pulses.     Heart sounds: Normal heart sounds. No murmur heard. Pulmonary:     Effort: Pulmonary effort is normal. No respiratory distress.     Breath sounds: Normal breath sounds. No wheezing, rhonchi or rales.  Abdominal:     General: Abdomen is flat. Bowel sounds are normal. There is no distension.     Palpations: Abdomen is soft. There is no mass.     Tenderness: There is no abdominal tenderness. There is no guarding or rebound.     Hernia: No hernia is present.  Musculoskeletal:     Right lower leg: No edema.     Left lower leg: No edema.  Skin:    General: Skin is warm and dry.     Findings: No erythema or rash.  Neurological:     Mental Status: He is alert.  Psychiatric:        Mood and Affect: Mood normal.        Behavior: Behavior normal.       Depression screen [facility] 2/9 06/04/2021 11/27/2020 12/17/2019  Decreased Interest 1 0 0  Down, Depressed, Hopeless 1 0 0  PHQ - 2 Score 2 0 0  Altered sleeping 1 1 -  Tired, decreased energy 1 2 -  Change in appetite 0 1 -  Feeling bad or failure about yourself  1 0 -  Trouble concentrating 1 2 -  Moving slowly or fidgety/restless 1 0 -  Suicidal thoughts 0 0 -  PHQ-9 Score 7 6 -    GAD 7 : Generalized Anxiety Score 06/04/2021 11/27/2020  Nervous, Anxious, on Edge 3 1  Control/stop worrying 2 1  Worry too much - different things 2 1  Trouble relaxing 2 1  Restless  1 1  Easily annoyed or irritable 2 1  Afraid - awful might happen 3 1  Total GAD 7 Score 15 7   Assessment & Plan:  This visit occurred during the SARS-CoV-2 public health emergency.  Safety protocols were in place, including screening questions prior to the visit, additional  usage of staff PPE, and extensive cleaning of exam room while observing appropriate contact time as indicated for disinfecting solutions.   Problem List Items Addressed This Visit     Central hypothyroidism    Refer back to endo per pt request.       Hypogonadism, male    Continues testosterone pending prostate biopsy.       Pituitary insufficiency (HCC)    Latest endo eval though hypothalamic issue, requests return to endo for further eval/management.       Hyperhidrosis - Primary    Predominant concern is longstanding chronic hyperhidrosis to upper back and chest. This limits his ability to be active, worse with exertion, worse with stress/anxiety. 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  He hasn't noted improvement while on prednisone, gabapentin, or zoloft. Will refer back to endo. Will trial cymbalta as per below in interim.       Peptic ulcer disease    He's been off pantoprazole without recurrent symptoms.       Essential hypertension    Chronic, stable on current regimen - continue.       Elevated PSA    Abnormal prostate MRI with elevated PSA pending fusion biopsy in [place].       Anxiety    Notes longstanding history of anxiety, previously did not respond to zoloft 75mg  daily. In h/o chronic pain, will start cymbalta 30mg  daily, update with effect after 1 month. RTC 3 mo f/u visit.       Relevant Medications   DULoxetine (CYMBALTA) 30 MG capsule     Meds ordered this encounter  Medications   cyanocobalamin 1000 MCG tablet    Sig: Take 1 tablet (1,000 mcg total) by mouth every Monday, Wednesday, and Friday.    Dispense:  30 tablet    Refill:  0   DULoxetine  (CYMBALTA) 30 MG capsule    Sig: Take 1 capsule (30 mg total) by mouth daily.    Dispense:  30 capsule    Refill:  3    No orders of the defined types were placed in this encounter.    Patient Instructions  Start cymbalta 30mg  daily.  Call neurosurgery about gabapentin refill, let me know if any trouble filling.  We will refer you back to [facility] endocrinology.  Return in 3-4 months for physical.   Follow up plan: Return in about 3 months (around 09/04/2021), or if symptoms worsen or fail to improve, for annual exam, prior fasting for blood work.  [name], MD

## 2021-06-04 NOTE — Telephone Encounter (Signed)
Stephen Avila is calling due to this preop clearance not being signed off by Dr. Claiborne Billings. She states it is urgent for this to be scheduled. Please advise.

## 2021-06-04 NOTE — Assessment & Plan Note (Signed)
Abnormal prostate MRI with elevated PSA pending fusion biopsy in [place].

## 2021-06-07 DIAGNOSIS — C61 Malignant neoplasm of prostate: Secondary | ICD-10-CM | POA: Diagnosis not present

## 2021-06-07 DIAGNOSIS — R972 Elevated prostate specific antigen [PSA]: Secondary | ICD-10-CM | POA: Diagnosis not present

## 2021-06-08 ENCOUNTER — Other Ambulatory Visit: Payer: Self-pay | Admitting: Urology

## 2021-06-11 ENCOUNTER — Encounter: Payer: Self-pay | Admitting: Urology

## 2021-06-11 ENCOUNTER — Other Ambulatory Visit: Payer: Self-pay

## 2021-06-11 ENCOUNTER — Ambulatory Visit (INDEPENDENT_AMBULATORY_CARE_PROVIDER_SITE_OTHER): Payer: BC Managed Care – PPO | Admitting: Urology

## 2021-06-11 VITALS — BP 150/86 | HR 77 | Ht 64.0 in | Wt 186.0 lb

## 2021-06-11 DIAGNOSIS — C61 Malignant neoplasm of prostate: Secondary | ICD-10-CM

## 2021-06-11 NOTE — Patient Instructions (Signed)
If you opt to pursue robotic prostatectomy, would recommend Dr Louis Meckel or Dr Alinda Money at [facility] urology in [place].   HIFU is a newer treatment option with lower side effect profile then robotic surgery, Dr Lewie Loron and Dr Joella Prince in [place] do many of these procedures. [email or web address] is their website and contact information.  Prostate Cancer The prostate is a small gland that produces fluid that makes up semen (seminal fluid). It is located below the bladder in men, in front of the rectum. Prostate cancer is the abnormal growth of cells in the prostate gland. What are the causes? The exact cause of this condition is not known. What increases the risk? You are more likely to develop this condition if: You are 45 years of age or older. You have a family history of prostate cancer. You have a family history of breast and ovarian cancer. You have genes that are passed from parent to child (inherited), such as BRCA1 and BRCA2. You have Lynch syndrome. African American men and men of African descent are diagnosed with prostate cancer at higher rates than other men. The reasons for this are not well understood and are likely due to a combination of genetic and environmental factors. What are the signs or symptoms? Symptoms of this condition include: Problems with urination. This may include: A weak or interrupted flow of urine. Trouble starting or stopping urination. Trouble emptying the bladder all the way. The need to urinate more often, especially at night. Blood in urine or semen. Persistent pain or discomfort in the lower back, lower abdomen, or hips. Trouble getting an erection. Weakness or numbness in the legs or feet. How is this diagnosed? This condition can be diagnosed with: A digital rectal exam. For this exam, a health care provider inserts a gloved finger into the rectum to feel the prostate gland. A blood test called a prostate-specific antigen (PSA) test. A procedure in  which a sample of tissue is taken from the prostate and checked under a microscope (prostate biopsy). An imaging test called transrectal ultrasonography. Once the condition is diagnosed, tests will be done to determine how far the cancer has spread. This is called staging the cancer. Staging may involve imaging tests, such as a bone scan, CT scan, PET scan, or MRI. Stages of prostate cancer The stages of prostate cancer are as follows: Stage 1 (I). At this stage, the cancer is found in the prostate only. The cancer is not visible on imaging tests, and it is usually found by accident, such as during prostate surgery. Stage 2 (II). At this stage, the cancer is more advanced than it is in stage 1, but the cancer has not spread outside the prostate. Stage 3 (III). At this stage, the cancer has spread beyond the outer layer of the prostate to nearby tissues. The cancer may be found in the seminal vesicles, which are near the bladder and the prostate. Stage 4 (IV). At this stage, the cancer has spread to other parts of the body, such as the lymph nodes, bones, bladder, rectum, liver, or lungs. Prostate cancer grading Prostate cancer is also graded according to how the cancer cells look under a microscope. This is called the Gleason score and the total score can range from 6-10, indicating how likely it is that the cancer will spread (metastasize) to other parts of the body. The higher the score, the greater the likelihood that the cancer will spread. Gleason 6 or lower: This indicates that the cancer cells look similar  to normal prostate cells (well differentiated). Gleason 7: This indicates that the cancer cells look somewhat similar to normal prostate cells (moderately differentiated). Gleason 8, 9, or 10: This indicates that the cancer cells look very different than normal prostate cells (poorly differentiated). How is this treated? Treatment for this condition depends on several factors, including the  stage of the cancer, your age, personal preferences, and your overall health. Talk with your health care provider about treatment options that are recommended for you. Common treatments include: Observation for early stage prostate cancer (active surveillance). This involves having exams, blood tests, and in some cases, more biopsies. For some men, this is the only treatment needed. Surgery. Types of surgeries include: Open surgery (radical prostatectomy). In this surgery, a larger incision is made to remove the prostate. A laparoscopic radical prostatectomy. This is a surgery to remove the prostate and lymph nodes through several small incisions. It is often referred to as a minimally invasive surgery. A robotic radical prostatectomy. This is laparoscopic surgery to remove the prostate and lymph nodes with the help of robotic arms that are controlled by the surgeon. Cryoablation. This is surgery to freeze and destroy cancer cells. Radiation treatment. Types of radiation treatment include: External beam radiation. This type aims beams of radiation from outside the body at the prostate to destroy cancerous cells. Brachytherapy. This type uses radioactive needles, seeds, wires, or tubes that are implanted into the prostate gland. Like external beam radiation, brachytherapy destroys cancerous cells. An advantage is that this type of radiation limits the damage to surrounding tissue and has fewer side effects. Chemotherapy. This treatment kills cancer cells or stops them from multiplying. It kills both cancer cells and normal cells. Targeted therapy. This treatment uses medicines to kill cancer cells without damaging normal cells. Hormone treatment. This treatment involves taking medicines that act on testosterone, one of the male hormones, by: Stopping your body from producing testosterone. Blocking testosterone from reaching cancer cells. Follow these instructions at home: Lifestyle Do not use any  products that contain nicotine or tobacco. These products include cigarettes, chewing tobacco, and vaping devices, such as e-cigarettes. If you need help quitting, ask your health care provider. Eat a healthy diet. To do this: Eat foods that are high in fiber. These include beans, whole grains, and fresh fruits and vegetables. Limit foods that are high in fat and sugar. These include fried or sweet foods. Treatment for prostate cancer may affect sexual function. If you have a partner, continue to have intimate moments. This may include touching, holding, hugging, and caressing your partner. Get plenty of sleep. Consider joining a support group for men who have prostate cancer. Meeting with a support group may help you learn to manage the stress of having cancer. General instructions Take over-the-counter and prescription medicines only as told by your health care provider. If you have to go to the hospital, notify your cancer specialist (oncologist). Keep all follow-up visits. This is important. Where to find more information American Cancer Society: www.cancer.[place] [employer]: www.cancer.net [employer]: www.cancer.gov Contact a health care provider if: You have new or increasing trouble urinating. You have new or increasing blood in your urine. You have new or increasing pain in your hips, back, or chest. Get help right away if: You have weakness or numbness in your legs. You cannot control urination or your bowel movements (incontinence). You have chills or a fever. Summary The prostate is a small gland that is involved in  the production of semen. It is located below a man's bladder, in front of the rectum. Prostate cancer is the abnormal growth of cells in the prostate gland. Treatment for this condition depends on the stage of the cancer, your age, personal preferences, and your overall health. Talk with your health care provider about treatment  options that are recommended for you. Consider joining a support group for men who have prostate cancer. Meeting with a support group may help you learn to manage the stress of having cancer. This information is not intended to replace advice given to you by your health care provider. Make sure you discuss any questions you have with your health care provider. Document Revised: 09/20/2020 Document Reviewed: 09/20/2020 Elsevier Patient Education  [place].

## 2021-06-11 NOTE — Progress Notes (Signed)
06/11/2021 4:27 PM   Stephen Avila April 25, 1976 [id number]  Reason for visit: New diagnosis of prostate cancer, low testosterone  HPI: 45 year old male who previously was followed by Dr. Bernardo Heater for the above issues and transferred his care to me.  His medical history is notable for CAD on anticoagulation.  He has been on long-term testosterone replacement therapy at least 4 to 5 years through his PCP with significant improvement in his quality of life and energy.  PSA was checked and was elevated at 6.1 and remained elevated on retesting, and he ultimately underwent a prostate MRI in November 2022 that showed a 40 g prostate with PI-RADS 5 lesion in the left posterior lateral peripheral zone in the mid gland and apex.  He underwent a fusion guided biopsy with Dr. Louis Meckel at [facility] urology which showed 3+3=6 prostate cancer with max core involvement of 70% in the ROI, as well as Gleason score 3+4=7 in the left lateral aspect of the prostate with max core involvement of 50%.   We had a lengthy conversation today about the patient's new diagnosis of prostate cancer.  We reviewed the risk classifications per the AUA guidelines including very low risk, low risk, intermediate risk, and high risk disease, and the need for additional staging imaging with CT and bone scan in patients with unfavorable intermediate risk and high risk disease.  I explained that his life expectancy, clinical stage, Gleason score, PSA, and other co-morbidities influence treatment strategies.  We discussed the roles of active surveillance, radiation therapy, surgical therapy with robotic prostatectomy, and hormone therapy with androgen deprivation.  We discussed that patients urinary symptoms also impact treatment strategy, as patients with severe lower urinary tract symptoms may have significant worsening or even develop urinary retention after undergoing radiation.  In regards to surgery, we discussed robotic prostatectomy  +/- lymphadenectomy at length.  The procedure takes 3 to 4 hours, and patient's typically discharge home on post-op day #1.  A Foley catheter is left in place for 7 to 10 days to allow for healing of the vesicourethral anastomosis.  There is a small risk of bleeding, infection, damage to surrounding structures or bowel, hernia, DVT/PE, or serious cardiac or pulmonary complications.  We discussed at length post-op side effects including erectile dysfunction, and the importance of pre-operative erectile function on long-term outcomes.  Even with a nerve sparing approach, there is an approximately 25% rate of permanent erectile dysfunction.  We also discussed postop urinary incontinence at length.  We expect patients to have stress incontinence post-operatively that will improve over period of weeks to months.  Less than 10% of men will require a pad at 1 year after surgery.  Patients will need to avoid heavy lifting and strenuous activity for 3 to 4 weeks, but most men return to their baseline activity status by 6 weeks.  We also discussed HIFU is a newer treatment option for prostate cancer, and that this is not FDA approved but there are some physicians in [place] that have had good results with prostate cancer patients with potentially a lower side effect profile than surgery.  Their contact information to a [facility] urology was provided.  With his favorable intermediate risk disease and localized disease on only the left side of his prostate, he is interested in potentially pursuing this option.  Finally, we discussed the controversies surrounding testosterone replacement and prostate cancer, and certainly I would not recommend that he continues testosterone with his new diagnosis of prostate cancer.  He  reports that he his quality of life is miserable off of testosterone and he does not want to stop testosterone at this time.  Risk discussed extensively.  In summary, Stephen Avila is a 45 y.o. 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  He is understandably very distressed today with his young age and new diagnosis of prostate cancer, and would like to take some time to do further research prior to determining a treatment strategy.  He also reports that his family members have all passed away in their early 66s, and he is considering not pursuing any treatment for prostate cancer.  I counseled him strongly that with his young age and intermediate risk disease he would most likely benefit significantly from upfront definitive treatment.   If he opts for surgery, he is interested in pursuing this in [place] with Dr. Louis Meckel or Dr. Alinda Money.  Contact information for HIFU providers in [place] area were also provided.  RTC with me virtual visit 4 to 6 weeks to re-discuss treatment strategies    I spent 45 total minutes on the day of the encounter including pre-visit review of the medical record, face-to-face time with the patient, and post visit ordering of labs/imaging/tests.  [place], [facility] [address] [place], [place] [address] [phone]

## 2021-06-12 DIAGNOSIS — M545 Low back pain, unspecified: Secondary | ICD-10-CM | POA: Diagnosis not present

## 2021-06-12 DIAGNOSIS — R202 Paresthesia of skin: Secondary | ICD-10-CM | POA: Diagnosis not present

## 2021-06-12 DIAGNOSIS — M5136 Other intervertebral disc degeneration, lumbar region: Secondary | ICD-10-CM | POA: Diagnosis not present

## 2021-06-12 DIAGNOSIS — M5416 Radiculopathy, lumbar region: Secondary | ICD-10-CM | POA: Diagnosis not present

## 2021-06-12 DIAGNOSIS — R200 Anesthesia of skin: Secondary | ICD-10-CM | POA: Diagnosis not present

## 2021-06-14 ENCOUNTER — Telehealth: Payer: Self-pay | Admitting: Urology

## 2021-06-14 ENCOUNTER — Ambulatory Visit: Payer: BC Managed Care – PPO | Admitting: Urology

## 2021-06-14 NOTE — Telephone Encounter (Signed)
Patient's girlfriend called back to let you know that he wanted to proceed with going to [facility] to have HIFU surgery. I have faxed the records over to [facility] [place] [phone] per their request. They are supposed to be seen for a consult next week. No referral from you is needed, I just wanted you to be aware.  Thanks, Sharyn Lull

## 2021-06-20 ENCOUNTER — Other Ambulatory Visit: Payer: Self-pay | Admitting: Cardiovascular Disease

## 2021-06-21 DIAGNOSIS — C61 Malignant neoplasm of prostate: Secondary | ICD-10-CM | POA: Diagnosis not present

## 2021-06-25 ENCOUNTER — Other Ambulatory Visit: Payer: Self-pay | Admitting: Cardiovascular Disease

## 2021-07-03 NOTE — Progress Notes (Deleted)
Cardiology Office Note:    Date:  07/03/2021   ID:  Stephen Avila, DOB [birth year], MRN [id number]  PCP:  [name], MD  Cardiologist:  Shelva Majestic, MD  Electrophysiologist:  None   Referring MD: [name], MD   Chief Complaint: follow-up of CAD  History of Present Illness:    Stephen Avila is a 45 y.o. male with a history of CAD with STEMI in 11/2019 s/p DES to OM2, hypertension, hyperlipidemia, PUD, buprenorphine dependency, and central hypopituitarism who is followed by Dr. Claiborne Billings and presents today for routine follow-up of CAD.   Patient was admitted in 11/2019 for inferolateral STEMI after presenting with chest pain. Emergent cardiac cath showed 100% stenosis of OM2, 70% stenosis of proximal RCA, and 70% stenosis of OM1. Patient underwent successful PCI with DES to OM2 lesion. Echo showed LVEF of 60-65% with normal wall motion, mild LVH, grade 2 diastolic dysfunction, normal RV, and no significant valvular disease. She was started on DAPT with Aspirin and Brilinta with plans to stop Aspirin after 2 weeks due to history of PUD and GI bleed.  Patient was last seen by Dr. Claiborne Billings in 05/2020 at which time he was doing well from a cardiac standpoint.  Patient presents today for follow-up. ***  CAD History of STEMI in 11/2019 s/p DES to OM2. - No chest pain.  - Currently on Brilinta 90mg  twice daily. Not on Aspirin due to history of PUD and GI bleed. Will discuss with Dr. Claiborne Billings about switching to Brilinta 60mg  twice daily or Plavix 75mg  daily now that patient is >1 year out from PCI. - Continue beta-blocker and statin.  Hypertension BP well controlled. - Continue current medications: Amlodipine 5mg  daily, Coreg 12.5mg  twice daily, Losartan 50mg  daily.  Hyperlipidemia Lipid panel in 12/2020: Total Cholesterol 184, Triglycerides 110, HDL 40, LDL 124. LDL goal <55 given history of MI. - Currently on Lipitor 80mg  daily. Will switch to Crestor 40mg  daily +/- add Zetia  10mg  daily. *** - Will repeat lipid panel and LFTs in 2 months. If LDL not at goal, will refer to lipid clinic for consideration of PCSK9 inhibitor or Inclisiran. ***  Past Medical History:  Diagnosis Date   Anxiety    Constipation    GERD (gastroesophageal reflux disease)    Heart murmur    History of chicken pox    Hypertension    Kidney stones    PUD (peptic ulcer disease)     Past Surgical History:  Procedure Laterality Date   CHOLECYSTECTOMY N/A 06/26/2015   Procedure: LAPAROSCOPIC CHOLECYSTECTOMY;  Surgeon: [name], MD;  Location: [facility];  Service: General;  Laterality: N/A;   COLONOSCOPY WITH PROPOFOL N/A 05/07/2019   Procedure: COLONOSCOPY WITH PROPOFOL;  Surgeon: [name], MD;  Location: [facility] ENDOSCOPY;  Service: Gastroenterology;  Laterality: N/A;   CORONARY/GRAFT ACUTE MI REVASCULARIZATION N/A 11/07/2019   Procedure: Coronary/Graft Acute MI Revascularization;  Surgeon: [name], MD;  Location: [place] [facility];  Service: Cardiovascular;  Laterality: N/A;   ESOPHAGOGASTRODUODENOSCOPY N/A 05/07/2019   Procedure: ESOPHAGOGASTRODUODENOSCOPY (EGD);  Surgeon: [name], MD;  Location: [facility] ENDOSCOPY;  Service: Gastroenterology;  Laterality: N/A;   HERNIA REPAIR  1979, 1981   x 2    LEFT HEART CATH AND CORONARY ANGIOGRAPHY N/A 11/07/2019   Procedure: LEFT HEART CATH AND CORONARY ANGIOGRAPHY;  Surgeon: [name], MD;  Location: [place] [facility];  Service: Cardiovascular;  Laterality: N/A;   LITHOTRIPSY     multiple  Current Medications: No outpatient medications have been marked as taking for the 07/11/21 encounter (Appointment) with Darreld Mclean, PA-C.     Allergies:   Erythromycin   Social History   Socioeconomic History   Marital status: Divorced    Spouse name: Not on file   Number of children: Not on file   Years of education: Not on file   Highest education level: Not on file  Occupational History   Not on file   Tobacco Use   Smoking status: Never   Smokeless tobacco: Former    Types: Snuff    Quit date: 09/27/2020  Substance and Sexual Activity   Alcohol use: Yes    Comment: occasional   Drug use: No   Sexual activity: Yes    Partners: Female  Other Topics Concern   Not on file  Social History Narrative   Friend of Michaela Corner   Right handed   Lives alone, dog   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    Edu: HS   Social Determinants of Health   Financial Resource Strain: Not on file  Food Insecurity: Not on file  Transportation Needs: Not on file  Physical Activity: Not on file  Stress: Not on file  Social Connections: Not on file     Family History: The patient's family history includes Arthritis in his mother; Asthma in his mother; COPD in his mother; Diabetes in his father; Early death (age of onset: 30) in his mother; Early death (age of onset: 44) in his father; Heart attack in his father and mother; Heart disease in his mother; Heart failure in his father; Learning disabilities in his father and mother; Lung cancer in his mother; Miscarriages / Stillbirths in his mother. There is no history of Thyroid disease.  ROS:   Please see the history of present illness.     EKGs/Labs/Other Studies Reviewed:    The following studies were reviewed today:  Left Cardiac Catheterization 11/07/2019: Prox RCA lesion is 70% stenosed. 1st Mrg lesion is 70% stenosed. 2nd Mrg lesion is 100% stenosed.   Acute ST segment elevation myocardial infarction secondary to total occlusion of a large OM 2 vessel in a left dominant circulation.   The left main is very short and immediately bifurcates into a normal large LAD vessel without significant obstruction; circumflex vessel gives rise to a high marginal OM1 which has a ramus distribution and has diffuse 70% proximal narrowing, the OM 2 vessel is totally occluded with TIMI 0 flow and the distal circumflex supplies the inferolateral and posterior lateral wall; small  nondominant RCA with diffuse 60-70% proximal to mid irregularity/stenosis.   Preserved global LV contractility with EF estimated at 55% with possible subtle focal inferior hypocontractility.   Successful PCI of the OM 2 vessel with PTCA and ultimate stenting with a 3.0 x 22 mm Resolute DES stent dilated to 3.1 mm with 100% occlusion being reduced to 0% and resumption of brisk TIMI-3 flow.   Recommendation: DAPT for minimum of 12 months.  Initial adequate therapy for concomitant CAD of his large high marginal branch and small RCA vessel.  Initiate atorvastatin 80 mg for aggressive lipid-lowering therapy and attempt to induce plaque regression with target LDL less than 70 and preferably in the fifties or below.  Optimal blood pressure control. _______________  Echocardiogram 11/07/2019: Impressions: 1. Normal LV systolic function; mild LVH; grade 2 diastolic dysfunction;  mild LAE.   2. Left ventricular ejection fraction, by estimation, is 60 to 65%. The  left ventricle has normal function. The left ventricle has no regional  wall motion abnormalities. There is mild left ventricular hypertrophy.  Left ventricular diastolic parameters  are consistent with Grade II diastolic dysfunction (pseudonormalization).   3. Right ventricular systolic function is normal. The right ventricular  size is normal.   4. Left atrial size was mildly dilated.   5. The mitral valve is normal in structure. Trivial mitral valve  regurgitation. No evidence of mitral stenosis.   6. The aortic valve is tricuspid. Aortic valve regurgitation is not  visualized. Mild aortic valve sclerosis is present, with no evidence of  aortic valve stenosis.   7. The inferior vena cava is normal in size with greater than 50%  respiratory variability, suggesting right atrial pressure of 3 mmHg.   EKG:  EKG ordered today. EKG personally reviewed and demonstrates ***.  Recent Labs: 12/11/2020: ALT 14; ALT 14; BUN 9; BUN 9; Creatinine,  Ser 0.97; Creatinine, Ser 0.99; Hemoglobin 17.2; Platelets 295; Potassium 4.0; Potassium 4.0; Sodium 141; Sodium 141; TSH 0.107; TSH 0.104  Recent Lipid Panel    Component Value Date/Time   CHOL 181 12/11/2020 0904   CHOL 184 12/11/2020 0904   TRIG 102 12/11/2020 0904   TRIG 110 12/11/2020 0904   HDL 40 12/11/2020 0904   HDL 40 12/11/2020 0904   CHOLHDL 4.5 12/11/2020 0904   CHOLHDL 4.6 12/11/2020 0904   CHOLHDL 4.5 11/07/2019 0304   VLDL 34 11/07/2019 0304   LDLCALC 122 (H) 12/11/2020 0904   LDLCALC 124 (H) 12/11/2020 0904    Physical Exam:    Vital Signs: There were no vitals taken for this visit.    Wt Readings from Last 3 Encounters:  06/11/21 186 lb (84.4 kg)  06/04/21 186 lb 9 oz (84.6 kg)  04/30/21 185 lb 6 oz (84.1 kg)     General: 45 y.o. male in no acute distress. HEENT: Normocephalic and atraumatic. Sclera clear. EOMs intact. Neck: Supple. No carotid bruits. No JVD. Heart: *** RRR. Distinct S1 and S2. No murmurs, gallops, or rubs. Radial and distal pedal pulses 2+ and equal bilaterally. Lungs: No increased work of breathing. Clear to ausculation bilaterally. No wheezes, rhonchi, or rales.  Abdomen: Soft, non-distended, and non-tender to palpation. Bowel sounds present in all 4 quadrants.  MSK: Normal strength and tone for age. *** Extremities: No lower extremity edema.    Skin: Warm and dry. Neuro: Alert and oriented x3. No focal deficits. Psych: Normal affect. Responds appropriately.   Assessment:    No diagnosis found.  Plan:     Disposition: Follow up in ***   Medication Adjustments/Labs and Tests Ordered: Current medicines are reviewed at length with the patient today.  Concerns regarding medicines are outlined above.  No orders of the defined types were placed in this encounter.  No orders of the defined types were placed in this encounter.   There are no Patient Instructions on file for this visit.   Signed, Darreld Mclean, PA-C   07/03/2021 12:46 PM    [facility]

## 2021-07-11 ENCOUNTER — Telehealth: Payer: BC Managed Care – PPO | Admitting: Urology

## 2021-07-11 ENCOUNTER — Other Ambulatory Visit: Payer: Self-pay

## 2021-07-11 ENCOUNTER — Ambulatory Visit (INDEPENDENT_AMBULATORY_CARE_PROVIDER_SITE_OTHER): Payer: BC Managed Care – PPO | Admitting: Nurse Practitioner

## 2021-07-11 ENCOUNTER — Encounter: Payer: Self-pay | Admitting: Student

## 2021-07-11 VITALS — BP 152/90 | HR 67 | Ht 66.0 in | Wt 191.4 lb

## 2021-07-11 DIAGNOSIS — I5189 Other ill-defined heart diseases: Secondary | ICD-10-CM | POA: Diagnosis not present

## 2021-07-11 DIAGNOSIS — E785 Hyperlipidemia, unspecified: Secondary | ICD-10-CM | POA: Diagnosis not present

## 2021-07-11 DIAGNOSIS — I2510 Atherosclerotic heart disease of native coronary artery without angina pectoris: Secondary | ICD-10-CM | POA: Diagnosis not present

## 2021-07-11 DIAGNOSIS — I10 Essential (primary) hypertension: Secondary | ICD-10-CM

## 2021-07-11 DIAGNOSIS — F419 Anxiety disorder, unspecified: Secondary | ICD-10-CM

## 2021-07-11 MED ORDER — AMLODIPINE BESYLATE 10 MG PO TABS: 10.0000 mg | ORAL_TABLET | Freq: Every day | ORAL | 3 refills | Status: DC

## 2021-07-11 NOTE — Progress Notes (Signed)
Office Visit    Patient Name: Stephen Avila Date of Encounter: 07/11/2021  Primary Care Provider:  [name], MD Primary Cardiologist:  Shelva Majestic, MD  Chief Complaint   46 year old male with a history of CAD (s/p STEMI, DES-OM2 in 11/2019), hypertension, hyperlipidemia, peptic ulcer disease, and central hypopituitarism who presents for follow-up related to CAD.   Past Medical History    Past Medical History:  Diagnosis Date   Anxiety    Constipation    GERD (gastroesophageal reflux disease)    Heart murmur    History of chicken pox    Hypertension    Kidney stones    PUD (peptic ulcer disease)    Past Surgical History:  Procedure Laterality Date   CHOLECYSTECTOMY N/A 06/26/2015   Procedure: LAPAROSCOPIC CHOLECYSTECTOMY;  Surgeon: [name], MD;  Location: [facility];  Service: General;  Laterality: N/A;   COLONOSCOPY WITH PROPOFOL N/A 05/07/2019   Procedure: COLONOSCOPY WITH PROPOFOL;  Surgeon: [name], MD;  Location: [facility] ENDOSCOPY;  Service: Gastroenterology;  Laterality: N/A;   CORONARY/GRAFT ACUTE MI REVASCULARIZATION N/A 11/07/2019   Procedure: Coronary/Graft Acute MI Revascularization;  Surgeon: [name], MD;  Location: [place] [facility];  Service: Cardiovascular;  Laterality: N/A;   ESOPHAGOGASTRODUODENOSCOPY N/A 05/07/2019   Procedure: ESOPHAGOGASTRODUODENOSCOPY (EGD);  Surgeon: [name], MD;  Location: [facility];  Service: Gastroenterology;  Laterality: N/A;   HERNIA REPAIR  1979, 1981   x 2    LEFT HEART CATH AND CORONARY ANGIOGRAPHY N/A 11/07/2019   Procedure: LEFT HEART CATH AND CORONARY ANGIOGRAPHY;  Surgeon: [name], MD;  Location: [place] [facility];  Service: Cardiovascular;  Laterality: N/A;   LITHOTRIPSY     multiple    Allergies  Allergies  Allergen Reactions   Erythromycin     Made chest feel "funny"    History of Present Illness    46 year old male with the above past medical history  including CAD (s/p STEMI, DES-OM2 in 11/2019), hypertension, hyperlipidemia, peptic ulcer disease, central hypopituitarism, and prostate cancer.   He presented to [facility] ED in 11/2019 with a STEMI. Cardiac cath showed total occlusion of OM2 vessel treated with a DES (on DAPT with ASA and Brilinta), as well as a 70% OM1 stenosis and 70% proximal RCA stenosis (small vessel), managed medically. Echo showed an EF of 60 to 65%, normal LV systolic function, mild LVH, G2DD, and mild LAE. He was last seen in the office by Dr. Claiborne Billings on 06/05/2020 and was doing well overall from a cardiac standpoint. He was slightly tachycardiac at the time of visit and his Coreg was increased. Unfortunately, he ws recently diagnosed with prostate cancer and is awaiting HIFU surgery.   He presents today for routine follow-up. Since his last visit he has done well overall from a cardiac standpoint.  He does report elevated blood pressure readings at home, blood pressure is elevated in office today.  He has what he describes as fleeting palpitations that occur occasionally at rest.  These last for seconds and resolve spontaneously.  No associated symptoms.  He denies chest pain, dizziness, shortness of breath.  His biggest complaint today is his anxiety which he feels is not well managed.  He describes episodes of anxiety that caused him to break out into a sweat. This has been happening for a long time.  He has tried several medications in the past, though only for 1 month at a time.  He feels like nothing has  worked.  He is very frustrated with his anxiety. He also feels that it has worsened since his diagnosis of cancer and following his heart attack.  Otherwise, he remains active, and has no other complaints today.  Home Medications    Current Outpatient Medications  Medication Sig Dispense Refill   acetaminophen (TYLENOL) 325 MG tablet Take 2 tablets (650 mg total) by mouth every 6 (six) hours as needed for mild pain (or Fever >/=  101).     amphetamine-dextroamphetamine (ADDERALL) 10 MG tablet Take 10 mg by mouth as needed.     atorvastatin (LIPITOR) 80 MG tablet Take 1 tablet (80 mg total) by mouth daily. 90 tablet 3   buprenorphine (SUBUTEX) 8 MG SUBL SL tablet Place 20 mg under the tongue daily.      carvedilol (COREG) 6.25 MG tablet TAKE 2 TABLETS(12.5 MG) BY MOUTH TWICE DAILY WITH A MEAL 180 tablet 2   gabapentin (NEURONTIN) 300 MG capsule Take 1 capsule (300 mg total) by mouth 3 (three) times daily.     linaclotide (LINZESS) 145 MCG CAPS capsule Take 1 capsule (145 mcg total) by mouth daily before breakfast. 30 capsule 3   losartan (COZAAR) 50 MG tablet Take 1 tablet (50 mg total) by mouth daily. 90 tablet 0   nitroGLYCERIN (NITROSTAT) 0.4 MG SL tablet Place 1 tablet (0.4 mg total) under the tongue every 5 (five) minutes x 3 doses as needed for chest pain. 25 tablet 3   testosterone cypionate (DEPOTESTOSTERONE CYPIONATE) 200 MG/ML injection INJECT 1 ML (200 MG TOTAL) INTO THE MUSCLE EVERY 14 (FOURTEEN) DAYS. 10 mL 0   ticagrelor (BRILINTA) 90 MG TABS tablet TAKE 1 TABLET BY MOUTH TWICE DAILY 180 tablet 0   amLODipine (NORVASC) 10 MG tablet Take 1 tablet (10 mg total) by mouth daily. 90 tablet 3   No current facility-administered medications for this visit.     Review of Systems    He denies chest pain, dyspnea, pnd, orthopnea, n, v, dizziness, syncope, edema, weight gain, or early satiety. All other systems reviewed and are otherwise negative except as noted above.   Physical Exam    VS:  BP (!) 152/90   Pulse 67   Ht 5\' 6"  (1.676 m)   Wt 191 lb 6.4 oz (86.8 kg)   SpO2 97%   BMI 30.89 kg/m   GEN: Well nourished, well developed, in no acute distress. HEENT: normal. Neck: Supple, no JVD, carotid bruits, or masses. Cardiac: RRR, no murmurs, rubs, or gallops. No clubbing, cyanosis, edema.  Radials/DP/PT 2+ and equal bilaterally.  Respiratory:  Respirations regular and unlabored, clear to auscultation  bilaterally. GI: Soft, nontender, nondistended, BS + x 4. MS: no deformity or atrophy. Skin: warm and dry, no rash. Neuro:  Strength and sensation are intact. Psych: Normal affect.  Accessory Clinical Findings    ECG personally reviewed by me today - Normal sinus rhythm, 67 - no acute changes.  Lab Results  Component Value Date   WBC CANCELED 12/11/2020   WBC 6.3 12/11/2020   HGB 17.2 12/11/2020   HCT 50.5 12/11/2020   MCV 83 12/11/2020   PLT 295 12/11/2020   Lab Results  Component Value Date   CREATININE 0.97 12/11/2020   CREATININE 0.99 12/11/2020   BUN 9 12/11/2020   BUN 9 12/11/2020   NA 141 12/11/2020   NA 141 12/11/2020   K 4.0 12/11/2020   K 4.0 12/11/2020   CL 103 12/11/2020   CL 103 12/11/2020  CO2 24 12/11/2020   CO2 24 12/11/2020   Lab Results  Component Value Date   ALT 14 12/11/2020   ALT 14 12/11/2020   AST 17 12/11/2020   AST 15 12/11/2020   ALKPHOS 97 12/11/2020   ALKPHOS 94 12/11/2020   BILITOT 0.4 12/11/2020   BILITOT 0.4 12/11/2020   Lab Results  Component Value Date   CHOL 181 12/11/2020   CHOL 184 12/11/2020   HDL 40 12/11/2020   HDL 40 12/11/2020   LDLCALC 122 (H) 12/11/2020   LDLCALC 124 (H) 12/11/2020   TRIG 102 12/11/2020   TRIG 110 12/11/2020   CHOLHDL 4.5 12/11/2020   CHOLHDL 4.6 12/11/2020    Lab Results  Component Value Date   HGBA1C 4.6 (L) 11/07/2019    Assessment & Plan    1. CAD: S/p STEMI, DES OM2 in 11/2019 (on DAPT with ASA and Brilinta). Residual disease including 70% OM1 stenosis and 70% proximal RCA stenosis (small vessel), managed medically. Echo at the time showed an EF of 60 to 65%, normal LV systolic function, mild LVH, G2DD, and mild LAE. Stable with no anginal symptoms. No indication for ischemic evaluation.  He was told that he would need DAPT for 1 year. Since it has been over a year since his stent was placed, I will reach out to Dr. Claiborne Billings to ask about transitioning off of Brilinta. For now, continue  aspirin, Brilinta, carvedilol, losartan, amlodipine as below, and Lipitor.  2. Grade 2 diastolic dysfunction: Most recent echo as above. Euvolemic and well compensated on exam. Continue current medication as above.  3. Hypertension: BP elevated in office today 152/90.  He also reports elevated blood pressures at home.  I will increase his amlodipine to 10 mg daily.  We discussed the possibility of ankle edema with increased dose.  He verbalized understanding.  I encouraged him to keep a blood pressure log and report blood pressure consistently greater than 130/80. Otherwise, continue current antihypertensive regimen.   4. Hyperlipidemia: LDL was 124 on 12/11/2020.  We will have him repeat fasting lipids, LFTs. For now, continue atorvastatin.  If LDL remains elevated, will consider adding Zetia.  5. Anxiety: Persistent anxiety with associated physical symptoms including excessive diaphoresis per patient. He states he has tried several medications in the past though he has only taken them for 1 month at a time and then quit as he felt they were not working. He feels his symptoms have worsened since his heart attack and since his diagnosis of cancer. We discussed options for treatment including medication, therapy, and mindfulness meditation.  He is agreeable to pursue further management. I will refer him to Conception Chancy, psychologist with [facility]. I encouraged him to follow-up with his PCP for further management.    6. Preoperative cardiac evaluation: Patient with recent diagnosis of prostate cancer.  He will likely need surgery for this in the future.  Based on today's visit, he is able to complete METS >4.  RCRI indicates class II risk, 0.9% risk of major cardiac event.  Therefore, based on ACC/AHA guidelines, patient would be at acceptable risk for the planned procedure without further cardiovascular testing.  7. Disposition: Follow-up in 1 year.   Lenna Sciara, NP 07/11/2021, 5:42 PM

## 2021-07-11 NOTE — Patient Instructions (Addendum)
Medication Instructions:  INCREASE Amlodipine to 10 mg daily *If you need a refill on your cardiac medications before your next appointment, please call your pharmacy*  Lab Work: Your physician recommends that you return for lab work TODAY:  Fasting Lipid Panel-DO NOT EAT OR DRINK PAST MIDNIGHT. OKAY TO HAVE WATER. Hepatic (Liver) Function Test  If you have labs (blood work) drawn today and your tests are completely normal, you will receive your results only by: MyChart Message (if you have MyChart) OR A paper copy in the mail If you have any lab test that is abnormal or we need to change your treatment, we will call you to review the results.  Testing/Procedures: You have been referred to Dr. Michail Sermon for psychology   Follow-Up: At [facility], you and your health needs are our priority.  As part of our continuing mission to provide you with exceptional heart care, we have created designated Provider Care Teams.  These Care Teams include your primary Cardiologist (physician) and Advanced Practice Providers (APPs -  Physician Assistants and Nurse Practitioners) who all work together to provide you with the care you need, when you need it.  We recommend signing up for the patient portal called "MyChart".  Sign up information is provided on this After Visit Summary.  MyChart is used to connect with patients for Virtual Visits (Telemedicine).  Patients are able to view lab/test results, encounter notes, upcoming appointments, etc.  Non-urgent messages can be sent to your provider as well.   To learn more about what you can do with MyChart, go to [email or web address].    Your next appointment:   1 year(s)  The format for your next appointment:   In Person  Provider:   Shelva Majestic, MD    Other Instructions Monitor blood pressure  (BP) at home. If BP is persistently greater than 130/80 please give our office a call at [phone]

## 2021-07-12 ENCOUNTER — Other Ambulatory Visit: Payer: Self-pay | Admitting: Urology

## 2021-07-12 DIAGNOSIS — C61 Malignant neoplasm of prostate: Secondary | ICD-10-CM

## 2021-07-13 ENCOUNTER — Encounter
Admission: RE | Admit: 2021-07-13 | Discharge: 2021-07-13 | Disposition: A | Discharge status: Home / Self Care | Payer: BC Managed Care – PPO | Source: Ambulatory Visit | Attending: Urology | Admitting: Urology

## 2021-07-13 ENCOUNTER — Other Ambulatory Visit: Payer: Self-pay

## 2021-07-13 DIAGNOSIS — M47812 Spondylosis without myelopathy or radiculopathy, cervical region: Secondary | ICD-10-CM | POA: Diagnosis not present

## 2021-07-13 DIAGNOSIS — C61 Malignant neoplasm of prostate: Secondary | ICD-10-CM | POA: Diagnosis not present

## 2021-07-13 MED ORDER — TECHNETIUM TC 99M MEDRONATE IV KIT
20.0000 | PACK | Freq: Once | INTRAVENOUS | Status: AC | PRN
  Administered 2021-07-13: 22.28 via INTRAVENOUS

## 2021-07-19 DIAGNOSIS — M5416 Radiculopathy, lumbar region: Secondary | ICD-10-CM | POA: Diagnosis not present

## 2021-07-19 DIAGNOSIS — Z6832 Body mass index (BMI) 32.0-32.9, adult: Secondary | ICD-10-CM | POA: Diagnosis not present

## 2021-07-19 DIAGNOSIS — I10 Essential (primary) hypertension: Secondary | ICD-10-CM | POA: Diagnosis not present

## 2021-08-06 DIAGNOSIS — E291 Testicular hypofunction: Secondary | ICD-10-CM | POA: Diagnosis not present

## 2021-08-06 DIAGNOSIS — C61 Malignant neoplasm of prostate: Secondary | ICD-10-CM | POA: Diagnosis not present

## 2021-08-12 ENCOUNTER — Other Ambulatory Visit: Payer: Self-pay | Admitting: Cardiovascular Disease

## 2021-08-15 ENCOUNTER — Telehealth: Payer: Self-pay | Admitting: Cardiovascular Disease

## 2021-08-15 MED ORDER — TICAGRELOR 90 MG PO TABS: 90.0000 mg | ORAL_TABLET | Freq: Two times a day (BID) | ORAL | 6 refills | Status: DC

## 2021-08-15 NOTE — Telephone Encounter (Signed)
Spoke to patient he stated he has been having swelling in both ankles right worse for the past 1 week.No sob.No chest pain.Stated he notices he tires easy,no energy.Stated he would like appointment with Dr.Kelly.Appointment scheduled with [facility] 3/22 at 3:40 pm.Offered appointment sooner with PA but he only wants to see Dr.Kelly.He will call back if symptoms worsen.Stated he needs Brilinta refill.Refill sent to pharmacy.

## 2021-08-15 NOTE — Telephone Encounter (Signed)
Pt c/o swelling: STAT is pt has developed SOB within 24 hours  How much weight have you gained and in what time span? yes  If swelling, where is the swelling located? Swelling in both ankles   Are you currently taking a fluid pill? No ( he does take any)  Are you currently SOB? No, just really tired, fatigue   Do you have a log of your daily weights (if so, list)?    Have you gained 3 pounds in a day or 5 pounds in a week?    Have you traveled recently? no

## 2021-08-23 DIAGNOSIS — C61 Malignant neoplasm of prostate: Secondary | ICD-10-CM | POA: Diagnosis not present

## 2021-09-26 ENCOUNTER — Ambulatory Visit: Payer: BC Managed Care – PPO | Admitting: Cardiovascular Disease

## 2021-10-01 ENCOUNTER — Encounter: Payer: Self-pay | Admitting: Cardiovascular Disease

## 2021-10-20 ENCOUNTER — Other Ambulatory Visit: Payer: Self-pay | Admitting: Family Medicine

## 2021-10-22 NOTE — Telephone Encounter (Signed)
Refill request Testosterone ?Last refill 04/27/21  10 ML ?Last office visit 06/04/21  ?Due CPE, no upcoming appointment scheduled ?

## 2021-10-23 NOTE — Telephone Encounter (Signed)
ERx 

## 2021-11-10 ENCOUNTER — Other Ambulatory Visit: Payer: Self-pay | Admitting: Cardiovascular Disease

## 2021-12-09 ENCOUNTER — Other Ambulatory Visit: Payer: Self-pay | Admitting: Cardiovascular Disease

## 2021-12-26 ENCOUNTER — Telehealth: Payer: Self-pay

## 2021-12-26 NOTE — Telephone Encounter (Signed)
Incoming call from patient and his significant other who state that the patient is having severe back pain, he is struggling to move and get out of bed, he has experienced this pain before and was treated with prednisone. They are requesting an appointment for evaluation. Advised pt and partner that we are a urologist office and therefore unable to treat back pain. Partner states that she was under the impression we were an ortho office. Advised pt to call ortho. Pt voiced understanding.

## 2021-12-27 DIAGNOSIS — E291 Testicular hypofunction: Secondary | ICD-10-CM | POA: Diagnosis not present

## 2021-12-27 DIAGNOSIS — C61 Malignant neoplasm of prostate: Secondary | ICD-10-CM | POA: Diagnosis not present

## 2021-12-27 DIAGNOSIS — N410 Acute prostatitis: Secondary | ICD-10-CM | POA: Diagnosis not present

## 2021-12-28 ENCOUNTER — Emergency Department (HOSPITAL_COMMUNITY): Payer: BC Managed Care – PPO

## 2021-12-28 ENCOUNTER — Other Ambulatory Visit: Payer: Self-pay

## 2021-12-28 ENCOUNTER — Telehealth: Payer: Self-pay | Admitting: Cardiovascular Disease

## 2021-12-28 ENCOUNTER — Emergency Department (HOSPITAL_COMMUNITY)
Admission: EM | Admit: 2021-12-28 | Discharge: 2021-12-28 | Disposition: A | Discharge status: Home / Self Care | Payer: BC Managed Care – PPO | Attending: Emergency Medicine | Admitting: Emergency Medicine

## 2021-12-28 ENCOUNTER — Emergency Department (HOSPITAL_BASED_OUTPATIENT_CLINIC_OR_DEPARTMENT_OTHER): Payer: BC Managed Care – PPO

## 2021-12-28 ENCOUNTER — Encounter (HOSPITAL_COMMUNITY): Payer: Self-pay

## 2021-12-28 DIAGNOSIS — R109 Unspecified abdominal pain: Secondary | ICD-10-CM | POA: Diagnosis not present

## 2021-12-28 DIAGNOSIS — R102 Pelvic and perineal pain: Secondary | ICD-10-CM | POA: Insufficient documentation

## 2021-12-28 DIAGNOSIS — I2510 Atherosclerotic heart disease of native coronary artery without angina pectoris: Secondary | ICD-10-CM | POA: Diagnosis not present

## 2021-12-28 DIAGNOSIS — Z79899 Other long term (current) drug therapy: Secondary | ICD-10-CM | POA: Insufficient documentation

## 2021-12-28 DIAGNOSIS — M79661 Pain in right lower leg: Secondary | ICD-10-CM | POA: Diagnosis not present

## 2021-12-28 DIAGNOSIS — I10 Essential (primary) hypertension: Secondary | ICD-10-CM | POA: Diagnosis not present

## 2021-12-28 DIAGNOSIS — M79609 Pain in unspecified limb: Secondary | ICD-10-CM | POA: Diagnosis not present

## 2021-12-28 DIAGNOSIS — R9431 Abnormal electrocardiogram [ECG] [EKG]: Secondary | ICD-10-CM | POA: Diagnosis not present

## 2021-12-28 DIAGNOSIS — Z8546 Personal history of malignant neoplasm of prostate: Secondary | ICD-10-CM | POA: Diagnosis not present

## 2021-12-28 DIAGNOSIS — M79604 Pain in right leg: Secondary | ICD-10-CM | POA: Diagnosis not present

## 2021-12-28 LAB — URINALYSIS, ROUTINE W REFLEX MICROSCOPIC
Bilirubin Urine: NEGATIVE
Glucose, UA: NEGATIVE mg/dL
Hgb urine dipstick: NEGATIVE
Ketones, ur: NEGATIVE mg/dL
Leukocytes,Ua: NEGATIVE
Nitrite: NEGATIVE
Protein, ur: NEGATIVE mg/dL
Specific Gravity, Urine: 1.01 (ref 1.005–1.030)
pH: 6 (ref 5.0–8.0)

## 2021-12-28 LAB — CBC WITH DIFFERENTIAL/PLATELET
Abs Immature Granulocytes: 0.02 10*3/uL (ref 0.00–0.07)
Basophils Absolute: 0.1 10*3/uL (ref 0.0–0.1)
Basophils Relative: 1 %
Eosinophils Absolute: 0.1 10*3/uL (ref 0.0–0.5)
Eosinophils Relative: 1 %
HCT: 54 % — ABNORMAL HIGH (ref 39.0–52.0)
Hemoglobin: 18.3 g/dL — ABNORMAL HIGH (ref 13.0–17.0)
Immature Granulocytes: 0 %
Lymphocytes Relative: 20 %
Lymphs Abs: 2 10*3/uL (ref 0.7–4.0)
MCH: 30.7 pg (ref 26.0–34.0)
MCHC: 33.9 g/dL (ref 30.0–36.0)
MCV: 90.6 fL (ref 80.0–100.0)
Monocytes Absolute: 0.8 10*3/uL (ref 0.1–1.0)
Monocytes Relative: 8 %
Neutro Abs: 6.9 10*3/uL (ref 1.7–7.7)
Neutrophils Relative %: 70 %
Platelets: 302 10*3/uL (ref 150–400)
RBC: 5.96 MIL/uL — ABNORMAL HIGH (ref 4.22–5.81)
RDW: 12.8 % (ref 11.5–15.5)
WBC: 9.8 10*3/uL (ref 4.0–10.5)
nRBC: 0 % (ref 0.0–0.2)

## 2021-12-28 LAB — COMPREHENSIVE METABOLIC PANEL
ALT: 17 U/L (ref 0–44)
AST: 18 U/L (ref 15–41)
Albumin: 4 g/dL (ref 3.5–5.0)
Alkaline Phosphatase: 76 U/L (ref 38–126)
Anion gap: 10 (ref 5–15)
BUN: 14 mg/dL (ref 6–20)
CO2: 26 mmol/L (ref 22–32)
Calcium: 9.1 mg/dL (ref 8.9–10.3)
Chloride: 104 mmol/L (ref 98–111)
Creatinine, Ser: 1.03 mg/dL (ref 0.61–1.24)
GFR, Estimated: >60 mL/min (ref >60)
Glucose, Bld: 104 mg/dL — ABNORMAL HIGH (ref 70–99)
Potassium: 4.1 mmol/L (ref 3.5–5.1)
Sodium: 140 mmol/L (ref 135–145)
Total Bilirubin: 1.1 mg/dL (ref 0.3–1.2)
Total Protein: 7.4 g/dL (ref 6.5–8.1)

## 2021-12-28 LAB — LIPASE, BLOOD: Lipase: 34 U/L (ref 11–51)

## 2021-12-28 MED ORDER — HYDROCODONE-ACETAMINOPHEN 5-325 MG PO TABS
1.0000 | ORAL_TABLET | Freq: Once | ORAL | Status: AC
  Administered 2021-12-28: 1 via ORAL
  Filled 2021-12-28: qty 1

## 2021-12-28 MED ORDER — METHOCARBAMOL 500 MG PO TABS: 500.0000 mg | ORAL_TABLET | Freq: Two times a day (BID) | ORAL | 0 refills | Status: DC

## 2021-12-28 MED ORDER — HYDROCODONE-ACETAMINOPHEN 5-325 MG PO TABS: 1.0000 | ORAL_TABLET | ORAL | 0 refills | Status: AC | PRN

## 2021-12-28 MED ORDER — PREDNISONE 10 MG (21) PO TBPK: ORAL_TABLET | Freq: Every day | ORAL | 0 refills | Status: DC

## 2021-12-28 MED ORDER — IOHEXOL 300 MG/ML  SOLN
100.0000 mL | Freq: Once | INTRAMUSCULAR | Status: AC | PRN
  Administered 2021-12-28: 100 mL via INTRAVENOUS

## 2021-12-28 MED ORDER — MORPHINE SULFATE (PF) 4 MG/ML IV SOLN
4.0000 mg | Freq: Once | INTRAVENOUS | Status: AC
  Administered 2021-12-28: 4 mg via INTRAVENOUS
  Filled 2021-12-28: qty 1

## 2021-12-28 NOTE — Telephone Encounter (Signed)
Pt c/o swelling: STAT is pt has developed SOB within 24 hours  If swelling, where is the swelling located? Both ankles swollen, mainly the right.  How much weight have you gained and in what time span? unsure  Have you gained 3 pounds in a day or 5 pounds in a week? unsure  Do you have a log of your daily weights (if so, list)? unsure  Are you currently taking a fluid pill? unsure  Are you currently SOB? unsure  Have you traveled recently?   He has pain going up to his right thigh to his pelvic area.  Girlfriend is concerned he might have a blood clot from what she has been reading.  In middle of trying to getting more information he decided he was going to the emergency room.  She hung up the phone.

## 2021-12-28 NOTE — Telephone Encounter (Signed)
Per significant other [name], patient reports severe right leg pain rated 10/10. Reports swelling right leg up to thigh. Ambulance has been contacted to take patient to ED. Advised that ED is the best option for his symptoms. Will route to provider.

## 2022-01-07 ENCOUNTER — Telehealth: Payer: Self-pay

## 2022-01-07 MED ORDER — PREDNISONE 20 MG PO TABS: ORAL_TABLET | ORAL | 0 refills | Status: DC

## 2022-01-07 NOTE — Addendum Note (Signed)
Addended by: [name] on: 01/07/2022 02:00 PM   Modules accepted: Orders

## 2022-01-07 NOTE — Telephone Encounter (Signed)
Spoke with Stephen Avila notifying him Dr. Darnell Level will send another course of prednisone to the pharmacy.  Pt expresses his thanks and states back doc f/u is on 01/15/22.

## 2022-01-07 NOTE — Telephone Encounter (Signed)
Patient's girlfriend called in stating that Stephen Avila was in the ED for lower extremity pain, was prescribed prednisone but has now finished the course and the pain has now returned. [name] would like to know if Dr.G. could send in another prescription of Prednisone until they see the back specialist next week.

## 2022-01-07 NOTE — Telephone Encounter (Signed)
Seen at ER 6/23 with dx R lumbar radiculitis, treated with prednisone taper. Will send in another prednisone course.  When is back doctor f/u scheduled next week?

## 2022-01-09 NOTE — Telephone Encounter (Signed)
agree

## 2022-01-14 DIAGNOSIS — C61 Malignant neoplasm of prostate: Secondary | ICD-10-CM | POA: Diagnosis not present

## 2022-01-15 DIAGNOSIS — M5416 Radiculopathy, lumbar region: Secondary | ICD-10-CM | POA: Diagnosis not present

## 2022-01-16 DIAGNOSIS — M545 Low back pain, unspecified: Secondary | ICD-10-CM | POA: Diagnosis not present

## 2022-01-22 DIAGNOSIS — M545 Low back pain, unspecified: Secondary | ICD-10-CM | POA: Diagnosis not present

## 2022-01-24 DIAGNOSIS — M48061 Spinal stenosis, lumbar region without neurogenic claudication: Secondary | ICD-10-CM | POA: Diagnosis not present

## 2022-01-24 DIAGNOSIS — M5126 Other intervertebral disc displacement, lumbar region: Secondary | ICD-10-CM | POA: Diagnosis not present

## 2022-01-24 DIAGNOSIS — M5416 Radiculopathy, lumbar region: Secondary | ICD-10-CM | POA: Diagnosis not present

## 2022-01-29 DIAGNOSIS — M545 Low back pain, unspecified: Secondary | ICD-10-CM | POA: Diagnosis not present

## 2022-02-04 ENCOUNTER — Telehealth: Payer: Self-pay | Admitting: Urology

## 2022-02-04 NOTE — Telephone Encounter (Signed)
[name] (spouse) called and Farah is in a lot of pain (0-10 @ 10 consistantly) and on the side of where his  prostate cancer is located. The pain is shooting down his right leg.  They would like a call to discuss his options. Please call # [phone]. Louie Casa is considering radaition externally for 5 week and having seed implanted but pain is so severe considering prostate removed. Please advise.    Please call before August 8th as that is when he starts radiation.

## 2022-02-05 ENCOUNTER — Institutional Professional Consult (permissible substitution): Payer: BC Managed Care – PPO | Admitting: Radiation Oncology

## 2022-02-05 DIAGNOSIS — M5126 Other intervertebral disc displacement, lumbar region: Secondary | ICD-10-CM | POA: Diagnosis not present

## 2022-02-08 ENCOUNTER — Institutional Professional Consult (permissible substitution): Payer: BC Managed Care – PPO | Admitting: Radiation Oncology

## 2022-02-12 ENCOUNTER — Telehealth: Payer: Self-pay

## 2022-02-12 ENCOUNTER — Telehealth: Payer: Self-pay | Admitting: Cardiovascular Disease

## 2022-02-12 ENCOUNTER — Ambulatory Visit
Admission: RE | Admit: 2022-02-12 | Discharge: 2022-02-12 | Disposition: A | Discharge status: Home / Self Care | Payer: BC Managed Care – PPO | Source: Ambulatory Visit | Attending: Radiation Oncology | Admitting: Radiation Oncology

## 2022-02-12 ENCOUNTER — Encounter: Payer: Self-pay | Admitting: Radiation Oncology

## 2022-02-12 VITALS — BP 122/78 | HR 81 | Temp 97.6°F | Resp 16 | Wt 186.1 lb

## 2022-02-12 DIAGNOSIS — Z191 Hormone sensitive malignancy status: Secondary | ICD-10-CM | POA: Diagnosis not present

## 2022-02-12 DIAGNOSIS — I10 Essential (primary) hypertension: Secondary | ICD-10-CM | POA: Diagnosis not present

## 2022-02-12 DIAGNOSIS — K5900 Constipation, unspecified: Secondary | ICD-10-CM | POA: Diagnosis not present

## 2022-02-12 DIAGNOSIS — Z7902 Long term (current) use of antithrombotics/antiplatelets: Secondary | ICD-10-CM | POA: Insufficient documentation

## 2022-02-12 DIAGNOSIS — Z87442 Personal history of urinary calculi: Secondary | ICD-10-CM | POA: Diagnosis not present

## 2022-02-12 DIAGNOSIS — Z801 Family history of malignant neoplasm of trachea, bronchus and lung: Secondary | ICD-10-CM | POA: Diagnosis not present

## 2022-02-12 DIAGNOSIS — J449 Chronic obstructive pulmonary disease, unspecified: Secondary | ICD-10-CM | POA: Insufficient documentation

## 2022-02-12 DIAGNOSIS — K219 Gastro-esophageal reflux disease without esophagitis: Secondary | ICD-10-CM | POA: Insufficient documentation

## 2022-02-12 DIAGNOSIS — R5383 Other fatigue: Secondary | ICD-10-CM | POA: Diagnosis not present

## 2022-02-12 DIAGNOSIS — R011 Cardiac murmur, unspecified: Secondary | ICD-10-CM | POA: Insufficient documentation

## 2022-02-12 DIAGNOSIS — Z8711 Personal history of peptic ulcer disease: Secondary | ICD-10-CM | POA: Diagnosis not present

## 2022-02-12 DIAGNOSIS — C61 Malignant neoplasm of prostate: Secondary | ICD-10-CM | POA: Diagnosis not present

## 2022-02-12 DIAGNOSIS — Z79899 Other long term (current) drug therapy: Secondary | ICD-10-CM | POA: Diagnosis not present

## 2022-02-12 DIAGNOSIS — Z7952 Long term (current) use of systemic steroids: Secondary | ICD-10-CM | POA: Diagnosis not present

## 2022-02-12 NOTE — Telephone Encounter (Signed)
Spoke with patient who is agreeable to do a tele visit on 8/11 at 2:20 pm. Med rec and consent have been done.

## 2022-02-12 NOTE — Consult Note (Signed)
NEW PATIENT EVALUATION  Name: Stephen Avila  MRN: [id number]  Date:   02/12/2022     DOB: May 28, 1976   This 46 y.o. 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  REFERRING PHYSICIAN: [name], MD  CHIEF COMPLAINT:  Chief Complaint  Patient presents with   Prostate Cancer    DIAGNOSIS: The encounter diagnosis was Malignant neoplasm of prostate ([place]).   PREVIOUS INVESTIGATIONS:  MRI scan reviewed Pathology reports reviewed Clinical notes reviewed  HPI: Patient is a 46 year old male presenting with a rising PSA back up to 6.0 in June 2022.  Patient underwent MRI scan of his prostate showing a PI-RADS category 5 lesion at the left posterior lateral peripheral zone in the mid gland.  No evidence of adenopathy was noted.  Patient had been on testosterone supplements for quite a while.  He underwent transrectal ultrasound-guided biopsy showing 4 cores positive for mostly Gleason 7 (3+4) adenocarcinoma out of 12 sampled.  This was performed back in December 22.  Bone scan was negative for metastatic disease.  Patient's been having significant problems with low back pain is scheduled for probable back surgery for degenerative disc disease at L4-5 and L5-S1.  He had a CT scan back in June for abdominal pain showing no evidence of intestinal obstruction no hydronephrosis.  He is seen today for consideration of treatment. He specifically denies any increased lower urinary tract symptoms diarrhea.  He is having fatigue secondary to coming off his testosterone supplements.  Patient has declined robotic prostatectomy. PLANNED TREATMENT REGIMEN: I-125 interstitial implant  PAST MEDICAL HISTORY:  has a past medical history of Anxiety, Constipation, GERD (gastroesophageal reflux disease), Heart murmur, History of chicken pox, Hypertension, Kidney stones, and PUD (peptic ulcer  disease).    PAST SURGICAL HISTORY:  Past Surgical History:  Procedure Laterality Date   CHOLECYSTECTOMY N/A 06/26/2015   Procedure: LAPAROSCOPIC CHOLECYSTECTOMY;  Surgeon: [name], MD;  Location: [facility];  Service: General;  Laterality: N/A;   COLONOSCOPY WITH PROPOFOL N/A 05/07/2019   Procedure: COLONOSCOPY WITH PROPOFOL;  Surgeon: [name], MD;  Location: [facility] ENDOSCOPY;  Service: Gastroenterology;  Laterality: N/A;   CORONARY/GRAFT ACUTE MI REVASCULARIZATION N/A 11/07/2019   Procedure: Coronary/Graft Acute MI Revascularization;  Surgeon: [name], MD;  Location: [facility];  Service: Cardiovascular;  Laterality: N/A;   ESOPHAGOGASTRODUODENOSCOPY N/A 05/07/2019   Procedure: ESOPHAGOGASTRODUODENOSCOPY (EGD);  Surgeon: [name], MD;  Location: [facility] ENDOSCOPY;  Service: Gastroenterology;  Laterality: N/A;   HERNIA REPAIR  1979, 1981   x 2    LEFT HEART CATH AND CORONARY ANGIOGRAPHY N/A 11/07/2019   Procedure: LEFT HEART CATH AND CORONARY ANGIOGRAPHY;  Surgeon: [name], MD;  Location: [facility];  Service: Cardiovascular;  Laterality: N/A;   LITHOTRIPSY     multiple    FAMILY HISTORY: family history includes Arthritis in his mother; Asthma in his mother; COPD in his mother; Diabetes in his father; Early death (age of onset: 41) in his mother; Early death (age of onset: 26) in his father; Heart attack in his father and mother; Heart disease in his mother; Heart failure in his father; Learning disabilities in his father and mother; Lung cancer in his mother; Miscarriages / Stillbirths in his mother.  SOCIAL HISTORY:  reports that he has never smoked. He quit smokeless tobacco use about 16 months ago.  His smokeless tobacco use included snuff. He reports current alcohol use. He reports that he does not use drugs.  ALLERGIES: Erythromycin  MEDICATIONS:  Current Outpatient Medications  Medication Sig Dispense Refill   acetaminophen  (TYLENOL) 325 MG tablet Take 2 tablets (650 mg total) by mouth every 6 (six) hours as needed for mild pain (or Fever >/= 101).     amLODipine (NORVASC) 10 MG tablet Take 1 tablet (10 mg total) by mouth daily. 90 tablet 3   amphetamine-dextroamphetamine (ADDERALL) 10 MG tablet Take 10 mg by mouth as needed.     atorvastatin (LIPITOR) 80 MG tablet TAKE 1 TABLET BY MOUTH DAILY 90 tablet 3   buprenorphine (SUBUTEX) 8 MG SUBL SL tablet Place 20 mg under the tongue daily.      carvedilol (COREG) 6.25 MG tablet TAKE 2 TABLETS(12.5 MG) BY MOUTH TWICE DAILY WITH A MEAL 180 tablet 2   carvedilol (COREG) 6.25 MG tablet TAKE 2 TABLETS(12.5 MG) BY MOUTH TWICE DAILY WITH A MEAL 180 tablet 3   gabapentin (NEURONTIN) 300 MG capsule Take 1 capsule (300 mg total) by mouth 3 (three) times daily.     linaclotide (LINZESS) 145 MCG CAPS capsule Take 1 capsule (145 mcg total) by mouth daily before breakfast. 30 capsule 3   losartan (COZAAR) 50 MG tablet TAKE 1 TABLET(50 MG) BY MOUTH DAILY 90 tablet 0   methocarbamol (ROBAXIN) 500 MG tablet Take 1 tablet (500 mg total) by mouth 2 (two) times daily. 20 tablet 0   nitroGLYCERIN (NITROSTAT) 0.4 MG SL tablet Place 1 tablet (0.4 mg total) under the tongue every 5 (five) minutes x 3 doses as needed for chest pain. 25 tablet 3   predniSONE (DELTASONE) 20 MG tablet Take two tablets daily for 3 days followed by one tablet daily for 4 days 10 tablet 0   testosterone cypionate (DEPOTESTOSTERONE CYPIONATE) 200 MG/ML injection INJECT 1 ML (200 MG TOTAL) INTO THE MUSCLE EVERY 14 DAYS 10 mL 0   ticagrelor (BRILINTA) 90 MG TABS tablet Take 1 tablet (90 mg total) by mouth 2 (two) times daily. 60 tablet 6   No current facility-administered medications for this encounter.    ECOG PERFORMANCE STATUS:  0 - Asymptomatic  REVIEW OF SYSTEMS: Patient denies any weight loss, fatigue, weakness, fever, chills or night sweats. Patient denies any loss of vision, blurred vision. Patient denies any  ringing  of the ears or hearing loss. No irregular heartbeat. Patient denies heart murmur or history of fainting. Patient denies any chest pain or pain radiating to her upper extremities. Patient denies any shortness of breath, difficulty breathing at night, cough or hemoptysis. Patient denies any swelling in the lower legs. Patient denies any nausea vomiting, vomiting of blood, or coffee ground material in the vomitus. Patient denies any stomach pain. Patient states has had normal bowel movements no significant constipation or diarrhea. Patient denies any dysuria, hematuria or significant nocturia. Patient denies any problems walking, swelling in the joints or loss of balance. Patient denies any skin changes, loss of hair or loss of weight. Patient denies any excessive worrying or anxiety or significant depression. Patient denies any problems with insomnia. Patient denies excessive thirst, polyuria, polydipsia. Patient denies any swollen glands, patient denies easy bruising or easy bleeding. Patient denies any recent infections, allergies or URI. Patient "s visual fields have not changed significantly in recent time.   PHYSICAL EXAM: BP 122/78 (BP Location: Left Arm, Patient Position: Sitting, Cuff Size: Normal)   Pulse 81  Temp 97.6 F (36.4 C) (Tympanic)   Resp 16   Wt 186 lb 1.6 oz (84.4 kg)   BMI 30.04 kg/m  Well-developed well-nourished patient in NAD. HEENT reveals PERLA, EOMI, discs not visualized.  Oral cavity is clear. No oral mucosal lesions are identified. Neck is clear without evidence of cervical or supraclavicular adenopathy. Lungs are clear to A&P. Cardiac examination is essentially unremarkable with regular rate and rhythm without murmur rub or thrill. Abdomen is benign with no organomegaly or masses noted. Motor sensory and DTR levels are equal and symmetric in the upper and lower extremities. Cranial nerves II through XII are grossly intact. Proprioception is intact. No peripheral  adenopathy or edema is identified. No motor or sensory levels are noted. Crude visual fields are within normal range.  LABORATORY DATA: Pathology reports reviewed    RADIOLOGY RESULTS: CT scans MRI scans and bone scan all reviewed compatible with above-stated findings   IMPRESSION: Stage IIb Gleason 7 (3+4) adenocarcinoma the prostate presenting with a PSA in the 6 range in 46 year old male  PLAN: This time patient has again declined robotic prostatectomy.  At his young age I believe I-125 interstitial implant would be his best option.  His [facility] nomogram shows only a 3% chance of lymph node involvement and low chance of extracapsular spread.  Risks and benefits of treatment including increased lower Neri tract symptoms diarrhea fatigue alteration of blood counts risks of general anesthesia as well as radiation safety precautions once he is implanted all were reviewed with the patient.  He comprehends my treatment plan well.  We will set up volume study as well as implant with urology.  I have also asked for them to start one 66-monthdepot of Eligard.  Patient may have back surgery in the near future which may push back our implant.  I would like to take this opportunity to thank you for allowing me to participate in the care of your patient..Noreene Filbert MD

## 2022-02-12 NOTE — Telephone Encounter (Signed)
   Pre-operative Risk Assessment    Patient Name: Stephen Avila  DOB: 06-14-1976 MRN: [id number]      Request for Surgical Clearance    Procedure:   Decompression of lower back, lL1, L2  Date of Surgery:  Clearance TBD                                 Surgeon:    Dr. Kary Kos  Surgeon's Group or Practice Name:  [facility] Phone number:  [phone] / [phone] Fax number:  [phone]   Type of Clearance Requested:   - Medical    Type of Anesthesia:  Unknown    Additional requests/questions:    Caller stated they need cardiac clearance  Signed, Heloise Beecham   02/12/2022, 9:46 AM

## 2022-02-12 NOTE — Telephone Encounter (Signed)
Primary Cardiologist:Thomas Claiborne Billings, MD   Preoperative team, please contact this patient and set up a phone call appointment for further preoperative risk assessment. Please obtain consent and complete medication review. Thank you for your help.   Medication clearance not requested, however patient is on Brilinta.  Per guidelines, he may hold Brilinta for 5 days upcoming procedure if no concerns for angina or coronary intervention at outside facility at time of virtual appointment.   Emmaline Life, NP-C    02/12/2022, 9:55 AM [place] [address], [address] Office [phone] Fax [phone]

## 2022-02-12 NOTE — Telephone Encounter (Signed)
  Patient Consent for Virtual Visit        [name] has provided verbal consent on 02/12/2022 for a virtual visit (video or telephone).   CONSENT FOR VIRTUAL VISIT FOR:  Stephen Avila  By participating in this virtual visit I agree to the following:  I hereby voluntarily request, consent and authorize [facility] and its employed or contracted physicians, physician assistants, nurse practitioners or other licensed health care professionals (the Practitioner), to provide me with telemedicine health care services (the "Services") as deemed necessary by the treating Practitioner. I acknowledge and consent to receive the Services by the Practitioner via telemedicine. I understand that the telemedicine visit will involve communicating with the Practitioner through live audiovisual communication technology and the disclosure of certain medical information by electronic transmission. I acknowledge that I have been given the opportunity to request an in-person assessment or other available alternative prior to the telemedicine visit and am voluntarily participating in the telemedicine visit.  I understand that I have the right to withhold or withdraw my consent to the use of telemedicine in the course of my care at any time, without affecting my right to future care or treatment, and that the Practitioner or I may terminate the telemedicine visit at any time. I understand that I have the right to inspect all information obtained and/or recorded in the course of the telemedicine visit and may receive copies of available information for a reasonable fee.  I understand that some of the potential risks of receiving the Services via telemedicine include:  Delay or interruption in medical evaluation due to technological equipment failure or disruption; Information transmitted may not be sufficient (e.g. poor resolution of images) to allow for appropriate medical decision making by the Practitioner;  and/or  In rare instances, security protocols could fail, causing a breach of personal health information.  Furthermore, I acknowledge that it is my responsibility to provide information about my medical history, conditions and care that is complete and accurate to the best of my ability. I acknowledge that Practitioner's advice, recommendations, and/or decision may be based on factors not within their control, such as incomplete or inaccurate data provided by me or distortions of diagnostic images or specimens that may result from electronic transmissions. I understand that the practice of medicine is not an exact science and that Practitioner makes no warranties or guarantees regarding treatment outcomes. I acknowledge that a copy of this consent can be made available to me via my patient portal ([facility]), or I can request a printed copy by calling the office of [facility].    I understand that my insurance will be billed for this visit.   I have read or had this consent read to me. I understand the contents of this consent, which adequately explains the benefits and risks of the Services being provided via telemedicine.  I have been provided ample opportunity to ask questions regarding this consent and the Services and have had my questions answered to my satisfaction. I give my informed consent for the services to be provided through the use of telemedicine in my medical care

## 2022-02-15 ENCOUNTER — Ambulatory Visit (INDEPENDENT_AMBULATORY_CARE_PROVIDER_SITE_OTHER): Payer: BC Managed Care – PPO | Admitting: Physician Assistant

## 2022-02-15 DIAGNOSIS — Z01810 Encounter for preprocedural cardiovascular examination: Secondary | ICD-10-CM | POA: Diagnosis not present

## 2022-02-15 NOTE — Progress Notes (Signed)
Virtual Visit via Telephone Note   Because of Stephen Avila's co-morbid illnesses, he is at least at moderate risk for complications without adequate follow up.  This format is felt to be most appropriate for this patient at this time.  The patient did not have access to video technology/had technical difficulties with video requiring transitioning to audio format only (telephone).  All issues noted in this document were discussed and addressed.  No physical exam could be performed with this format.  Please refer to the patient's chart for his consent to telehealth for [facility].  Evaluation Performed:  Preoperative cardiovascular risk assessment _____________   Date:  02/15/2022   Patient ID:  Stephen Avila, DOB Nov 06, 1975, MRN [id number] Patient Location:  Home Provider location:   Office  Primary Care Provider:  [name], MD Primary Cardiologist:  Shelva Majestic, MD  Chief Complaint / Patient Profile   46 y.o. 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  Past Medical History    Past Medical History:  Diagnosis Date   Anxiety    Constipation    GERD (gastroesophageal reflux disease)    Heart murmur    History of chicken pox    Hypertension    Kidney stones    PUD (peptic ulcer disease)    Past Surgical History:  Procedure Laterality Date   CHOLECYSTECTOMY N/A 06/26/2015   Procedure: LAPAROSCOPIC CHOLECYSTECTOMY;  Surgeon: [name], MD;  Location: [facility];  Service: General;  Laterality: N/A;   COLONOSCOPY WITH PROPOFOL N/A 05/07/2019   Procedure: COLONOSCOPY WITH PROPOFOL;  Surgeon: [name], MD;  Location: [facility] ENDOSCOPY;  Service: Gastroenterology;  Laterality: N/A;   CORONARY/GRAFT ACUTE MI  REVASCULARIZATION N/A 11/07/2019   Procedure: Coronary/Graft Acute MI Revascularization;  Surgeon: [name], MD;  Location: [place] [facility];  Service: Cardiovascular;  Laterality: N/A;   ESOPHAGOGASTRODUODENOSCOPY N/A 05/07/2019   Procedure: ESOPHAGOGASTRODUODENOSCOPY (EGD);  Surgeon: [name], MD;  Location: [facility] ENDOSCOPY;  Service: Gastroenterology;  Laterality: N/A;   HERNIA REPAIR  1979, 1981   x 2    LEFT HEART CATH AND CORONARY ANGIOGRAPHY N/A 11/07/2019   Procedure: LEFT HEART CATH AND CORONARY ANGIOGRAPHY;  Surgeon: [name], MD;  Location: [place] [facility];  Service: Cardiovascular;  Laterality: N/A;   LITHOTRIPSY     multiple    Allergies  Allergies  Allergen Reactions   Erythromycin     Made chest feel "funny"    History of Present Illness    Stephen Avila is a 46 y.o. male who presents via audio/video conferencing for a telehealth visit today.  Pt was last seen in cardiology clinic on 07/11/2021 by Diona Browner, NP.  At that time Stephen Avila was doing well.  The patient is now pending procedure as outlined above. Since his last visit, he has been doing fine from a cardiac standpoint.  He does not have any shortness of breath or chest pain.  He has severe limitations due to his back.  He cannot stand for over a minute.  He is not walking much at all or doing much activity at all.  For this reason, he did not meet the minimum METS requirement of 4 METS on the DASI.  Stress test was normal. We will  send clearance today.  We did discuss holding Brilinta x5 days prior to the procedure and restarting when medically safe to do so.  Reports no shortness of breath nor dyspnea on exertion. Reports no chest pain, pressure, or tightness. No edema, orthopnea, PND. Reports no palpitations.   Home Medications    Prior to Admission medications   Medication Sig Start Date End Date Taking? Authorizing Provider  acetaminophen (TYLENOL) 325 MG tablet Take 2  tablets (650 mg total) by mouth every 6 (six) hours as needed for mild pain (or Fever >/= 101). 05/07/19   Gouru, Illene Silver, MD  amLODipine (NORVASC) 10 MG tablet Take 1 tablet (10 mg total) by mouth daily. 07/11/21   Lenna Sciara, NP  amphetamine-dextroamphetamine (ADDERALL) 10 MG tablet Take 10 mg by mouth as needed. 06/04/20   [provider]  atorvastatin (LIPITOR) 80 MG tablet TAKE 1 TABLET BY MOUTH DAILY 11/12/21   [name], MD  buprenorphine (SUBUTEX) 8 MG SUBL SL tablet Place 20 mg under the tongue daily.  09/22/18   [provider]  carvedilol (COREG) 6.25 MG tablet TAKE 2 TABLETS(12.5 MG) BY MOUTH TWICE DAILY WITH A MEAL 08/13/21   [name], MD  carvedilol (COREG) 6.25 MG tablet TAKE 2 TABLETS(12.5 MG) BY MOUTH TWICE DAILY WITH A MEAL 11/12/21   [name], MD  gabapentin (NEURONTIN) 300 MG capsule Take 1 capsule (300 mg total) by mouth 3 (three) times daily. 06/04/21   [name], MD  linaclotide [facility]) 145 MCG CAPS capsule Take 1 capsule (145 mcg total) by mouth daily before breakfast. 12/20/20   [name], MD  losartan (COZAAR) 50 MG tablet TAKE 1 TABLET(50 MG) BY MOUTH DAILY 12/10/21   [name], MD  methocarbamol (ROBAXIN) 500 MG tablet Take 1 tablet (500 mg total) by mouth 2 (two) times daily. 12/28/21   Dorothyann Peng, PA-C  nitroGLYCERIN (NITROSTAT) 0.4 MG SL tablet Place 1 tablet (0.4 mg total) under the tongue every 5 (five) minutes x 3 doses as needed for chest pain. 11/08/19   Kroeger, Lorelee Cover., PA-C  predniSONE (DELTASONE) 20 MG tablet Take two tablets daily for 3 days followed by one tablet daily for 4 days 01/07/22   [name], MD  testosterone cypionate (DEPOTESTOSTERONE CYPIONATE) 200 MG/ML injection INJECT 1 ML (200 MG TOTAL) INTO THE MUSCLE EVERY 14 DAYS 10/23/21   [name], MD  ticagrelor (BRILINTA) 90 MG TABS tablet Take 1 tablet (90 mg total) by mouth 2 (two) times daily. 08/15/21   [name], MD     Physical Exam    Vital Signs:  Stephen Avila does not have vital signs available for review today.  Given telephonic nature of communication, physical exam is limited. AAOx3. NAD. Normal affect.  Speech and respirations are unlabored.  Accessory Clinical Findings    None  Assessment & Plan    1.  Preoperative Cardiovascular Risk Assessment:    Stephen Avila perioperative risk of a major cardiac event is 6.6% according to the Revised Cardiac Risk Index (RCRI).  Therefore, he is at high risk for perioperative complications.   His functional capacity is poor at 3.63 METs according to the [facility] Activity Status Index (DASI). Recommendations: Cardiac clearance will have to be approved by Dr. Claiborne Billings Antiplatelet and/or Anticoagulation Recommendations: Ticagrelor (Brilinta) can be held for 5 days prior to his surgery and resumed as soon as possible post op.   Time:   Today, I have spent 10  minutes with the patient with telehealth technology discussing medical history, symptoms, and management plan.     Elgie Collard, PA-C  02/15/2022, 2:19 PM

## 2022-02-18 ENCOUNTER — Telehealth: Payer: Self-pay

## 2022-02-18 ENCOUNTER — Telehealth: Payer: Self-pay | Admitting: Physician Assistant

## 2022-02-18 DIAGNOSIS — Z01810 Encounter for preprocedural cardiovascular examination: Secondary | ICD-10-CM

## 2022-02-18 NOTE — Telephone Encounter (Signed)
Per Nicholes Rough, PAC she is awaiting input from Dr. Claiborne Billings, however she feels that a Carlton Adam is needed. I will place the order for Lexiscan.

## 2022-02-18 NOTE — Telephone Encounter (Signed)
Patient had a telephone visit for preop risk assessment with my colleague Nicholes Rough, PA-C, on 02/15/2022. At that time, patient reported doing well from a cardiac standpoint with no chest pain or shortness of breath. However, his activity was very limited due to his back pain. He could not stand for over 1 minute and was not able to complete >4.0 METS. Per that note, patient was advised that cardiac clearance would need to be approved by Dr. Claiborne Billings. Patient's surgery is scheduled for this Friday. I spoke with Johann Capers and decision was made to go ahead and set up [facility] for further risk stratification in an effort not to delay his surgery. I called and spoke with patient. He is agreeable with this plan. He states he is already holding his Brilinta. We will try to get his Myoview done ASAP.  Shared Decision Making/Informed Consent{ The risks [chest pain, shortness of breath, cardiac arrhythmias, dizziness, blood pressure fluctuations, myocardial infarction, stroke/transient ischemic attack, nausea, vomiting, allergic reaction, radiation exposure, metallic taste sensation and life-threatening complications (estimated to be 1 in 10,000)], benefits (risk stratification, diagnosing coronary artery disease, treatment guidance) and alternatives of a nuclear stress test were discussed in detail with Mr. Arrona and he agrees to proceed.  Darreld Mclean, PA-C 02/18/2022 4:05 PM

## 2022-02-18 NOTE — Telephone Encounter (Signed)
Called pt's insurance company to verify if PA is required for Eligard. PA is required, [employer] will fax form to be completed for PA.

## 2022-02-18 NOTE — Telephone Encounter (Signed)
Follow Up:     Wife is calling to see what is the status of patient's clearance. Sh says his surgery is 02-22-22.g

## 2022-02-18 NOTE — Telephone Encounter (Signed)
-----   Message from Manus Rudd, RN sent at 02/12/2022 10:05 AM EDT ----- Dr. Baruch Gouty would like this patient to have Eligaurd. Thank you, Lorry

## 2022-02-19 ENCOUNTER — Telehealth (HOSPITAL_COMMUNITY): Payer: Self-pay | Admitting: Radiology

## 2022-02-19 NOTE — Telephone Encounter (Signed)
See notes from [place], [employer] 02/18/22. Pt is going to need a stress test before he can be cleared. Stress test is set up for tomorrow 02/20/22. Test will need to be read by cardiologist for clearance.   Once we do have the pt cleared we will be sure to fax clearance notes.

## 2022-02-19 NOTE — Telephone Encounter (Signed)
Patient given detailed instructions per Myocardial Perfusion Study Information Sheet for the test on 02/20/2022 at 7:30. Patient notified to arrive 15 minutes early and that it is imperative to arrive on time for appointment to keep from having the test rescheduled.  If you need to cancel or reschedule your appointment, please call the office within 24 hours of your appointment. . Patient verbalized understanding.EHK

## 2022-02-20 ENCOUNTER — Ambulatory Visit (HOSPITAL_COMMUNITY): Payer: BC Managed Care – PPO | Attending: Cardiology

## 2022-02-20 ENCOUNTER — Encounter (HOSPITAL_COMMUNITY): Payer: BC Managed Care – PPO

## 2022-02-20 DIAGNOSIS — Z01810 Encounter for preprocedural cardiovascular examination: Secondary | ICD-10-CM | POA: Insufficient documentation

## 2022-02-20 LAB — MYOCARDIAL PERFUSION IMAGING
LV dias vol: 115 mL (ref 62–150)
LV sys vol: 55 mL
Nuc Stress EF: 53 %
Peak HR: 105 {beats}/min
Rest HR: 72 {beats}/min
Rest Nuclear Isotope Dose: 30.8 mCi
SDS: 0
SRS: 0
SSS: 0
ST Depression (mm): 0 mm
Stress Nuclear Isotope Dose: 850 mCi
TID: 1.08

## 2022-02-20 MED ORDER — TECHNETIUM TC 99M TETROFOSMIN IV KIT: 30.8000 | PACK | Freq: Once | INTRAVENOUS | Status: DC | PRN

## 2022-02-20 MED ORDER — TECHNETIUM TC 99M TETROFOSMIN IV KIT
10.4000 | PACK | Freq: Once | INTRAVENOUS | Status: AC | PRN
  Administered 2022-02-20: 10.4 via INTRAVENOUS

## 2022-02-20 MED ORDER — REGADENOSON 0.4 MG/5ML IV SOLN: 0.4000 mg | Freq: Once | INTRAVENOUS | Status: DC

## 2022-02-27 DIAGNOSIS — M5116 Intervertebral disc disorders with radiculopathy, lumbar region: Secondary | ICD-10-CM | POA: Diagnosis not present

## 2022-02-27 DIAGNOSIS — M5126 Other intervertebral disc displacement, lumbar region: Secondary | ICD-10-CM | POA: Diagnosis not present

## 2022-03-19 ENCOUNTER — Other Ambulatory Visit: Payer: Self-pay | Admitting: Cardiovascular Disease

## 2022-04-18 ENCOUNTER — Ambulatory Visit: Payer: BC Managed Care – PPO | Attending: Radiation Oncology | Admitting: Radiation Oncology

## 2022-04-19 ENCOUNTER — Other Ambulatory Visit: Payer: Self-pay | Admitting: Cardiovascular Disease

## 2022-04-30 DIAGNOSIS — C61 Malignant neoplasm of prostate: Secondary | ICD-10-CM | POA: Diagnosis not present

## 2022-05-03 DIAGNOSIS — C61 Malignant neoplasm of prostate: Secondary | ICD-10-CM | POA: Diagnosis not present

## 2022-05-28 DIAGNOSIS — C61 Malignant neoplasm of prostate: Secondary | ICD-10-CM | POA: Diagnosis not present

## 2022-06-03 DIAGNOSIS — C61 Malignant neoplasm of prostate: Secondary | ICD-10-CM | POA: Diagnosis not present

## 2022-06-03 DIAGNOSIS — N410 Acute prostatitis: Secondary | ICD-10-CM | POA: Diagnosis not present

## 2022-06-04 ENCOUNTER — Ambulatory Visit: Payer: BC Managed Care – PPO | Admitting: Nurse Practitioner

## 2022-06-24 DIAGNOSIS — M4716 Other spondylosis with myelopathy, lumbar region: Secondary | ICD-10-CM | POA: Diagnosis not present

## 2022-06-24 DIAGNOSIS — C61 Malignant neoplasm of prostate: Secondary | ICD-10-CM | POA: Diagnosis not present

## 2022-06-24 DIAGNOSIS — B3742 Candidal balanitis: Secondary | ICD-10-CM | POA: Diagnosis not present

## 2022-07-10 ENCOUNTER — Telehealth: Payer: Self-pay | Admitting: Cardiovascular Disease

## 2022-07-10 MED ORDER — LOSARTAN POTASSIUM 50 MG PO TABS: ORAL_TABLET | ORAL | 3 refills | Status: DC

## 2022-07-10 MED ORDER — TICAGRELOR 90 MG PO TABS: 90.0000 mg | ORAL_TABLET | Freq: Two times a day (BID) | ORAL | 3 refills | Status: DC

## 2022-07-10 MED ORDER — AMLODIPINE BESYLATE 10 MG PO TABS: 10.0000 mg | ORAL_TABLET | Freq: Every day | ORAL | 3 refills | Status: DC

## 2022-07-10 MED ORDER — TICAGRELOR 90 MG PO TABS: 90.0000 mg | ORAL_TABLET | Freq: Two times a day (BID) | ORAL | 6 refills | Status: DC

## 2022-07-10 MED ORDER — ATORVASTATIN CALCIUM 80 MG PO TABS: 80.0000 mg | ORAL_TABLET | Freq: Every day | ORAL | 3 refills | Status: DC

## 2022-07-10 MED ORDER — CARVEDILOL 6.25 MG PO TABS: ORAL_TABLET | ORAL | 3 refills | Status: DC

## 2022-07-10 NOTE — Telephone Encounter (Signed)
Patient has been having tingling in left arm and hand off and on for the past 6 months. Wife states he had back surgery in October and was told he has neck problems that could be contributing to this. Patient has been scheduled for a f/u on 01/15 with the NP [facility]. Please advise.

## 2022-07-10 NOTE — Telephone Encounter (Signed)
Spoke to patient's wife she stated husband has been having tingling in left arm and hand off and on for the past 6 months.No chest pain.He has to have a Monday appointment.Advised to keep appointment already scheduled Mon 1/15 at 2:20 pm with Diona Browner NP.She requested 90 day refills on B/P meds and Brilinta.Refills sent to his pharmacy.

## 2022-07-10 NOTE — Telephone Encounter (Signed)
*  STAT* If patient is at the pharmacy, call can be transferred to refill team.   1. Which medications need to be refilled? (please list name of each medication and dose if known)   losartan (COZAAR) 50 MG tablet    ticagrelor (BRILINTA) 90 MG TABS tablet    2. Which pharmacy/location (including street and city if local pharmacy) is medication to be sent to? [facility] [place], [address]   3. Do they need a 30 day or 90 day supply? 90 day supply   Patient is out of losartan

## 2022-07-22 ENCOUNTER — Ambulatory Visit: Payer: BC Managed Care – PPO | Admitting: Nurse Practitioner

## 2022-07-22 NOTE — Progress Notes (Deleted)
Office Visit    Patient Name: Stephen Avila Date of Encounter: 07/22/2022  Primary Care Provider:  [name], MD Primary Cardiologist:  Shelva Majestic, MD  Chief Complaint     47 year old male with a history of CAD (s/p STEMI, DES-OM2 in 11/2019), hypertension, hyperlipidemia, peptic ulcer disease, central hypopituitarism and prostate cancer who presents for follow-up related to CAD.    Past Medical History    Past Medical History:  Diagnosis Date   Anxiety    Constipation    GERD (gastroesophageal reflux disease)    Heart murmur    History of chicken pox    Hypertension    Kidney stones    PUD (peptic ulcer disease)    Past Surgical History:  Procedure Laterality Date   CHOLECYSTECTOMY N/A 06/26/2015   Procedure: LAPAROSCOPIC CHOLECYSTECTOMY;  Surgeon: [name], MD;  Location: [facility];  Service: General;  Laterality: N/A;   COLONOSCOPY WITH PROPOFOL N/A 05/07/2019   Procedure: COLONOSCOPY WITH PROPOFOL;  Surgeon: [name], MD;  Location: [facility] ENDOSCOPY;  Service: Gastroenterology;  Laterality: N/A;   CORONARY/GRAFT ACUTE MI REVASCULARIZATION N/A 11/07/2019   Procedure: Coronary/Graft Acute MI Revascularization;  Surgeon: [name], MD;  Location: [place] [facility];  Service: Cardiovascular;  Laterality: N/A;   ESOPHAGOGASTRODUODENOSCOPY N/A 05/07/2019   Procedure: ESOPHAGOGASTRODUODENOSCOPY (EGD);  Surgeon: [name], MD;  Location: [facility] ENDOSCOPY;  Service: Gastroenterology;  Laterality: N/A;   HERNIA REPAIR  1979, 1981   x 2    LEFT HEART CATH AND CORONARY ANGIOGRAPHY N/A 11/07/2019   Procedure: LEFT HEART CATH AND CORONARY ANGIOGRAPHY;  Surgeon: [name], MD;  Location: [place] [facility];  Service: Cardiovascular;  Laterality: N/A;   LITHOTRIPSY     multiple    Allergies  Allergies  Allergen Reactions   Erythromycin     Made chest feel "funny"     Labs/Other Studies Reviewed    The following studies were reviewed  today: Lexiscan Myoview 02/20/2022:   The study is normal. The study is low risk.   No ST deviation was noted.   Nuclear stress EF: 53 %. The left ventricular ejection fraction is mildly decreased (45-54%). End diastolic cavity size is normal. Recommend TTE correlation if clinically indicated   Prior study not available for comparison.   Echo 11/07/2019: IMPRESSIONS    1. Normal LV systolic function; mild LVH; grade 2 diastolic dysfunction;  mild LAE.   2. Left ventricular ejection fraction, by estimation, is 60 to 65%. The  left ventricle has normal function. The left ventricle has no regional  wall motion abnormalities. There is mild left ventricular hypertrophy.  Left ventricular diastolic parameters  are consistent with Grade II diastolic dysfunction (pseudonormalization).   3. Right ventricular systolic function is normal. The right ventricular  size is normal.   4. Left atrial size was mildly dilated.   5. The mitral valve is normal in structure. Trivial mitral valve  regurgitation. No evidence of mitral stenosis.   6. The aortic valve is tricuspid. Aortic valve regurgitation is not  visualized. Mild aortic valve sclerosis is present, with no evidence of  aortic valve stenosis.   7. The inferior vena cava is normal in size with greater than 50%  respiratory variability, suggesting right atrial pressure of 3 mmHg.   LHC 11/07/2019: Prox RCA lesion is 70% stenosed. 1st Mrg lesion is 70% stenosed. 2nd Mrg lesion is 100% stenosed.   Acute ST segment elevation myocardial infarction secondary to total  occlusion of a large OM 2 vessel in a left dominant circulation.   The left main is very short and immediately bifurcates into a normal large LAD vessel without significant obstruction; circumflex vessel gives rise to a high marginal OM1 which has a ramus distribution and has diffuse 70% proximal narrowing, the OM 2 vessel is totally occluded with TIMI 0 flow and the distal circumflex  supplies the inferolateral and posterior lateral wall; small nondominant RCA with diffuse 60-70% proximal to mid irregularity/stenosis.   Preserved global LV contractility with EF estimated at 55% with possible subtle focal inferior hypocontractility.   Successful PCI of the OM 2 vessel with PTCA and ultimate stenting with a 3.0 x 22 mm Resolute DES stent dilated to 3.1 mm with 100% occlusion being reduced to 0% and resumption of brisk TIMI-3 flow.   RECOMMENDATION:  DAPT for minimum of 12 months.  Initial adequate therapy for concomitant CAD of his large high marginal branch and small RCA vessel.  Initiate atorvastatin 80 mg for aggressive lipid-lowering therapy and attempt to induce plaque regression with target LDL less than 70 and preferably in the fifties or below.  Optimal blood pressure control.  Recent Labs: 12/28/2021: ALT 17; BUN 14; Creatinine, Ser 1.03; Hemoglobin 18.3; Platelets 302; Potassium 4.1; Sodium 140  Recent Lipid Panel    Component Value Date/Time   CHOL 181 12/11/2020 0904   CHOL 184 12/11/2020 0904   TRIG 102 12/11/2020 0904   TRIG 110 12/11/2020 0904   HDL 40 12/11/2020 0904   HDL 40 12/11/2020 0904   CHOLHDL 4.5 12/11/2020 0904   CHOLHDL 4.6 12/11/2020 0904   CHOLHDL 4.5 11/07/2019 0304   VLDL 34 11/07/2019 0304   LDLCALC 122 (H) 12/11/2020 0904   LDLCALC 124 (H) 12/11/2020 0904    History of Present Illness    47 year old male with the above past medical history including CAD (s/p STEMI, DES-OM2 in 11/2019), hypertension, hyperlipidemia, peptic ulcer disease, central hypopituitarism, and prostate cancer.   He presented to [facility] ED in 11/2019 in the setting of STEMI. Cardiac catheterization showed total occlusion of OM2 vessel treated with a DES (on DAPT with ASA and Brilinta), as well as a 70% OM1 stenosis and 70% proximal RCA stenosis (small vessel), managed medically. Echocardiogram at the time showed an EF of 60 to 65%, normal LV systolic function, mild LVH,  G2DD, and mild LAE. He was last seen in the office on 07/11/2021 and was stable overall from a cardiac standpoint.  He did note some mildly elevated BP readings at home, fleeting palpitations, as well as significant anxiety.  See scanned Myoview in 02/2022 in the setting of preoperative cardiac evaluation was low risk, no evidence of ischemia, EF 53%.-He contacted our office on 07/10/2022 with complaints of tingling in his left arm and hand off and on for the past 6 months.   He presents today for routine follow-up. Since his last visit he has     1. CAD: S/p STEMI, DES OM2 in 11/2019 (on DAPT with ASA and Brilinta). Residual disease including 70% OM1 stenosis and 70% proximal RCA stenosis (small vessel), managed medically. Echo at the time showed an EF of 60 to 65%, normal LV systolic function, mild LVH, G2DD, and mild LAE. Stable with no anginal symptoms. No indication for ischemic evaluation.  He was told that he would need DAPT for 1 year. Since it has been over a year since his stent was placed, I will reach out to Dr. Claiborne Billings to  ask about transitioning off of Brilinta. For now, continue aspirin, Brilinta, carvedilol, losartan, amlodipine as below, and Lipitor.   2. Grade 2 diastolic dysfunction: Most recent echo as above. Euvolemic and well compensated on exam. Continue current medication as above.   3. Hypertension: BP elevated in office today 152/90.  He also reports elevated blood pressures at home.  I will increase his amlodipine to 10 mg daily.  We discussed the possibility of ankle edema with increased dose.  He verbalized understanding.  I encouraged him to keep a blood pressure log and report blood pressure consistently greater than 130/80. Otherwise, continue current antihypertensive regimen.    4. Hyperlipidemia: LDL was 124 on 12/11/2020.  We will have him repeat fasting lipids, LFTs. For now, continue atorvastatin.  If LDL remains elevated, will consider adding Zetia.   5. Anxiety: Persistent  anxiety with associated physical symptoms including excessive diaphoresis per patient. He states he has tried several medications in the past though he has only taken them for 1 month at a time and then quit as he felt they were not working. He feels his symptoms have worsened since his heart attack and since his diagnosis of cancer. We discussed options for treatment including medication, therapy, and mindfulness meditation.  He is agreeable to pursue further management. I will refer him to Conception Chancy, psychologist with [facility]. I encouraged him to follow-up with his PCP for further management.    6. Disposition: Follow-up in  Home Medications    Current Outpatient Medications  Medication Sig Dispense Refill   acetaminophen (TYLENOL) 325 MG tablet Take 2 tablets (650 mg total) by mouth every 6 (six) hours as needed for mild pain (or Fever >/= 101).     amLODipine (NORVASC) 10 MG tablet Take 1 tablet (10 mg total) by mouth daily. 90 tablet 3   amphetamine-dextroamphetamine (ADDERALL) 10 MG tablet Take 10 mg by mouth as needed.     atorvastatin (LIPITOR) 80 MG tablet Take 1 tablet (80 mg total) by mouth daily. 90 tablet 3   buprenorphine (SUBUTEX) 8 MG SUBL SL tablet Place 20 mg under the tongue daily.      carvedilol (COREG) 6.25 MG tablet TAKE 2 TABLETS(12.5 MG) BY MOUTH TWICE DAILY WITH A MEAL 360 tablet 3   gabapentin (NEURONTIN) 300 MG capsule Take 1 capsule (300 mg total) by mouth 3 (three) times daily.     linaclotide (LINZESS) 145 MCG CAPS capsule Take 1 capsule (145 mcg total) by mouth daily before breakfast. 30 capsule 3   losartan (COZAAR) 50 MG tablet TAKE 1 TABLET(50 MG) BY MOUTH DAILY 90 tablet 3   methocarbamol (ROBAXIN) 500 MG tablet Take 1 tablet (500 mg total) by mouth 2 (two) times daily. 20 tablet 0   nitroGLYCERIN (NITROSTAT) 0.4 MG SL tablet Place 1 tablet (0.4 mg total) under the tongue every 5 (five) minutes x 3 doses as needed for chest pain. 25 tablet 3    predniSONE (DELTASONE) 20 MG tablet Take two tablets daily for 3 days followed by one tablet daily for 4 days 10 tablet 0   testosterone cypionate (DEPOTESTOSTERONE CYPIONATE) 200 MG/ML injection INJECT 1 ML (200 MG TOTAL) INTO THE MUSCLE EVERY 14 DAYS 10 mL 0   ticagrelor (BRILINTA) 90 MG TABS tablet Take 1 tablet (90 mg total) by mouth 2 (two) times daily. 180 tablet 3   No current facility-administered medications for this visit.   Facility-Administered Medications Ordered in Other Visits  Medication Dose Route Frequency Provider  Last Rate Last Admin   regadenoson (LEXISCAN) injection SOLN 0.4 mg  0.4 mg Intravenous Once Janina Mayo, MD       technetium tetrofosmin (TC-MYOVIEW) injection [id number] millicurie  [id number] millicurie Intravenous Once PRN Janina Mayo, MD         Review of Systems    ***.  All other systems reviewed and are otherwise negative except as noted above.    Physical Exam    VS:  There were no vitals taken for this visit. , BMI There is no height or weight on file to calculate BMI.     GEN: Well nourished, well developed, in no acute distress. HEENT: normal. Neck: Supple, no JVD, carotid bruits, or masses. Cardiac: RRR, no murmurs, rubs, or gallops. No clubbing, cyanosis, edema.  Radials/DP/PT 2+ and equal bilaterally.  Respiratory:  Respirations regular and unlabored, clear to auscultation bilaterally. GI: Soft, nontender, nondistended, BS + x 4. MS: no deformity or atrophy. Skin: warm and dry, no rash. Neuro:  Strength and sensation are intact. Psych: Normal affect.  Accessory Clinical Findings    ECG personally reviewed by me today - *** - no acute changes.   Lab Results  Component Value Date   WBC 9.8 12/28/2021   HGB 18.3 (H) 12/28/2021   HCT 54.0 (H) 12/28/2021   MCV 90.6 12/28/2021   PLT 302 12/28/2021   Lab Results  Component Value Date   CREATININE 1.03 12/28/2021   BUN 14 12/28/2021   NA 140 12/28/2021   K 4.1 12/28/2021   CL 104  12/28/2021   CO2 26 12/28/2021   Lab Results  Component Value Date   ALT 17 12/28/2021   AST 18 12/28/2021   ALKPHOS 76 12/28/2021   BILITOT 1.1 12/28/2021   Lab Results  Component Value Date   CHOL 181 12/11/2020   CHOL 184 12/11/2020   HDL 40 12/11/2020   HDL 40 12/11/2020   LDLCALC 122 (H) 12/11/2020   LDLCALC 124 (H) 12/11/2020   TRIG 102 12/11/2020   TRIG 110 12/11/2020   CHOLHDL 4.5 12/11/2020   CHOLHDL 4.6 12/11/2020    Lab Results  Component Value Date   HGBA1C 4.6 (L) 11/07/2019    Assessment & Plan    1.  ***  No BP recorded.  {Refresh Note OR Click here to enter BP  :1}***   Lenna Sciara, NP 07/22/2022, 6:25 AM

## 2022-08-07 ENCOUNTER — Encounter: Payer: Self-pay | Admitting: Urology

## 2022-08-07 ENCOUNTER — Ambulatory Visit (INDEPENDENT_AMBULATORY_CARE_PROVIDER_SITE_OTHER): Payer: BC Managed Care – PPO | Admitting: Urology

## 2022-08-07 ENCOUNTER — Other Ambulatory Visit: Payer: Self-pay | Admitting: *Deleted

## 2022-08-07 VITALS — BP 148/76 | HR 76 | Ht 66.0 in | Wt 194.0 lb

## 2022-08-07 DIAGNOSIS — R399 Unspecified symptoms and signs involving the genitourinary system: Secondary | ICD-10-CM | POA: Diagnosis not present

## 2022-08-07 DIAGNOSIS — N401 Enlarged prostate with lower urinary tract symptoms: Secondary | ICD-10-CM

## 2022-08-07 DIAGNOSIS — C61 Malignant neoplasm of prostate: Secondary | ICD-10-CM | POA: Diagnosis not present

## 2022-08-07 DIAGNOSIS — R350 Frequency of micturition: Secondary | ICD-10-CM

## 2022-08-07 DIAGNOSIS — R351 Nocturia: Secondary | ICD-10-CM

## 2022-08-07 LAB — BLADDER SCAN AMB NON-IMAGING

## 2022-08-07 MED ORDER — TAMSULOSIN HCL 0.4 MG PO CAPS: 0.8000 mg | ORAL_CAPSULE | Freq: Every day | ORAL | 3 refills | Status: DC

## 2022-08-07 NOTE — Progress Notes (Signed)
   08/07/2022 4:06 PM   Stephen Avila [birth year] [id number]  Reason for visit: Follow up prostate cancer, urinary symptoms  HPI: 47 year old male who was diagnosed with prostate cancer in November 2022 with an elevated PSA of 6.1, and favorable intermediate risk disease.  He was seen by a urologist at associated [facility] in [place] to request HIFU, but this was not covered by insurance and he ultimately underwent brachytherapy in November 2023.  He did not get any ADT.  He was unhappy with his care there and would like to transfer his urologic care back to [place].  I was able to review the outside operative note from his brachytherapy procedure.  Urinalysis has been benign, PVR today normal at 47m.  He has been on Flomax 0.4 mg nightly.  He still drinks a fair amount of soda during the day.  His primary urinary complaints right now are urgency and frequency as well as nocturia.  He denies any dysuria or gross hematuria.  He was tried on Gemtesa by his outside urologist without any improvement.  We discussed the effect of brachytherapy on urinary symptoms, and that this can take 3 months to resolve.  I recommended avoiding all sodas, diet drinks, and carbonation, and increase the Flomax to 0.8 mg nightly.  We discussed other etiologies like urethral stricture, and could consider cystoscopy in the future if no improvement.  He also had a number of questions about resuming his testosterone.  We had a long conversation about the controversy surrounding testosterone replacement and prostate cancer patients, and ideally would wait at least 1 to 2 years to determine PSA stability prior to resuming testosterone.  He feels that he has been on testosterone for over 20 years and his mood and quality of life is significantly decreased since he has been off the testosterone after time of diagnosis.  -We discussed the importance of monitoring the PSA after brachytherapy, and he will  follow-up in 1 to 2 months with initial PSA after brachytherapy treatment -Flomax increased to 0.8 mg nightly, behavioral strategies discussed -Consider cystoscopy if worsening/persistent symptoms to evaluate for urethral stricture  BBilley Co MD  [address]inthicum[address] [place] [phone]

## 2022-08-16 ENCOUNTER — Ambulatory Visit: Payer: BC Managed Care – PPO | Admitting: Nurse Practitioner

## 2022-08-16 ENCOUNTER — Encounter: Payer: Self-pay | Admitting: Nurse Practitioner

## 2022-08-16 ENCOUNTER — Telehealth: Payer: Self-pay

## 2022-08-16 VITALS — BP 136/72 | HR 81 | Temp 98.0°F | Resp 16 | Ht 66.0 in | Wt 198.5 lb

## 2022-08-16 DIAGNOSIS — T1592XA Foreign body on external eye, part unspecified, left eye, initial encounter: Secondary | ICD-10-CM | POA: Diagnosis not present

## 2022-08-16 DIAGNOSIS — S0502XA Injury of conjunctiva and corneal abrasion without foreign body, left eye, initial encounter: Secondary | ICD-10-CM

## 2022-08-16 MED ORDER — ERYTHROMYCIN 5 MG/GM OP OINT: 1.0000 | TOPICAL_OINTMENT | Freq: Three times a day (TID) | OPHTHALMIC | 0 refills | Status: AC

## 2022-08-16 NOTE — Telephone Encounter (Signed)
Noted will evaluate in office at visit

## 2022-08-16 NOTE — Assessment & Plan Note (Signed)
Foreign body removed with sterile Q-tip.  Did do a reexamination with Woods lamp utilizing polyp came eyedrops and fluorescein strip.  Corneal abrasion noted patient was erythromycin 0.5% ointment 3 times daily clinic 7 days.  Blurred vision precautions reviewed.  Follow-up if no improvement

## 2022-08-16 NOTE — Patient Instructions (Signed)
Nice to see you today I have sent in eye ointment It will cause blurry vision for approx 10-15 mins after each application.  Follow up if no improvement

## 2022-08-16 NOTE — Telephone Encounter (Signed)
[name] - Client Nonclinical Telephone Record  [facility] Client [name] Night - Client Client Site [name] Provider [name] - MD Contact Type Call Who Is Calling Patient / Member / Family / Caregiver Caller Name [name] Phone Number [phone] Patient Name Stephen Avila Patient DOB 06-30-1976 Call Type Message Only Information Provided Reason for Call Request to Schedule Office Appointment Initial Comment Caller needs to schedule an appt for her boyfriend, please call back. Patient request to speak to RN No Disp. Time Disposition Final User 08/16/2022 8:07:11 AM General Information Provided Yes Rhea Belton Call Closed By: Rhea Belton Transaction Date/Time: 08/16/2022 8:04:46 AM (ET   Per appt notes pt has appt scheduled with Romilda Garret today at 2 pm. Sending note to Romilda Garret NP and [facility].

## 2022-08-16 NOTE — Assessment & Plan Note (Signed)
Foreign body appreciated on plain eye exam.  Was able to remove with a sterile Q-tip.  Patient had a Woods lamp exam done before foreign body removal and post foreign body removal.  Patient tolerated procedure well.  Erythromycin ointment 0.5% 3 times daily for the next 7 days.  Follow-up with improvement

## 2022-08-16 NOTE — Progress Notes (Signed)
   Acute Office Visit  Subjective:     Patient ID: Stephen Avila, male    DOB: Feb 18, 1976, 47 y.o.   MRN: [id number]  Chief Complaint  Patient presents with   Foreign Body in Eye    Left eyes since last night. Burns     Patient is in today for eye problem with a history of Migraine, HTN, Peptic ulcer disease, HLD, STEMI,  State that it started last night. States that he redoing with lumbar and shaving. States that he was wearing protective lenses. States started after his shower. No corrective lenses.    Review of Systems  Constitutional:  Negative for chills and fever.  Eyes:  Negative for blurred vision, double vision, pain (burns), discharge and redness.        Objective:    BP 136/72   Pulse 81   Temp 98 F (36.7 C)   Resp 16   Ht '5\' 6"'$  (1.676 m)   Wt 198 lb 8 oz (90 kg)   SpO2 96%   BMI 32.04 kg/m    Physical Exam Vitals and nursing note reviewed.  Constitutional:      Appearance: Normal appearance.  Eyes:     General:        Left eye: Foreign body present.    Extraocular Movements: Extraocular movements intact.     Pupils: Pupils are equal, round, and reactive to light.   Cardiovascular:     Rate and Rhythm: Normal rate and regular rhythm.     Heart sounds: Normal heart sounds.  Pulmonary:     Effort: Pulmonary effort is normal.     Breath sounds: Normal breath sounds.  Neurological:     Mental Status: He is alert.     No results found for any visits on 08/16/22.      Assessment & Plan:   Problem List Items Addressed This Visit       Other   Left corneal abrasion    Foreign body removed with sterile Q-tip.  Did do a reexamination with Woods lamp utilizing polyp came eyedrops and fluorescein strip.  Corneal abrasion noted patient was erythromycin 0.5% ointment 3 times daily clinic 7 days.  Blurred vision precautions reviewed.  Follow-up if no improvement      Relevant Medications   erythromycin ophthalmic ointment   Foreign body of  left eye - Primary    Foreign body appreciated on plain eye exam.  Was able to remove with a sterile Q-tip.  Patient had a Woods lamp exam done before foreign body removal and post foreign body removal.  Patient tolerated procedure well.  Erythromycin ointment 0.5% 3 times daily for the next 7 days.  Follow-up with improvement       Meds ordered this encounter  Medications   erythromycin ophthalmic ointment    Sig: Place 1 Application into the left eye 3 (three) times daily for 7 days.    Dispense:  21 g    Refill:  0    Order Specific Question:   Supervising Provider    Answer:   TOWER, MARNE A [[id number]    Return if symptoms worsen or fail to improve.  Romilda Garret, NP

## 2022-08-19 NOTE — Telephone Encounter (Signed)
Patient was seen that day for evaluation. No further action needed today.

## 2022-08-26 ENCOUNTER — Ambulatory Visit: Payer: BC Managed Care – PPO | Attending: Nurse Practitioner | Admitting: Nurse Practitioner

## 2022-08-26 ENCOUNTER — Encounter: Payer: Self-pay | Admitting: Nurse Practitioner

## 2022-08-26 VITALS — BP 138/82 | HR 90 | Ht 66.0 in | Wt 192.0 lb

## 2022-08-26 DIAGNOSIS — I5189 Other ill-defined heart diseases: Secondary | ICD-10-CM

## 2022-08-26 DIAGNOSIS — I25118 Atherosclerotic heart disease of native coronary artery with other forms of angina pectoris: Secondary | ICD-10-CM

## 2022-08-26 DIAGNOSIS — F419 Anxiety disorder, unspecified: Secondary | ICD-10-CM

## 2022-08-26 DIAGNOSIS — E785 Hyperlipidemia, unspecified: Secondary | ICD-10-CM | POA: Diagnosis not present

## 2022-08-26 DIAGNOSIS — I10 Essential (primary) hypertension: Secondary | ICD-10-CM | POA: Diagnosis not present

## 2022-08-26 DIAGNOSIS — M25471 Effusion, right ankle: Secondary | ICD-10-CM

## 2022-08-26 DIAGNOSIS — M25472 Effusion, left ankle: Secondary | ICD-10-CM

## 2022-08-26 MED ORDER — OLMESARTAN MEDOXOMIL 40 MG PO TABS: 40.0000 mg | ORAL_TABLET | Freq: Every day | ORAL | 3 refills | Status: DC

## 2022-08-26 MED ORDER — AMLODIPINE BESYLATE 5 MG PO TABS: 5.0000 mg | ORAL_TABLET | Freq: Every day | ORAL | 3 refills | Status: DC

## 2022-08-26 NOTE — Patient Instructions (Signed)
Medication Instructions:  Stop Losartan as directed Start Olmesartan 40 mg daily Decrease Amlodipine 5 mg daily   *If you need a refill on your cardiac medications before your next appointment, please call your pharmacy*   Lab Work: Your physician recommends that you return for lab work in 2 weeks. Fating Lipid panel & CMET  If you have labs (blood work) drawn today and your tests are completely normal, you will receive your results only by: MyChart Message (if you have MyChart) OR A paper copy in the mail If you have any lab test that is abnormal or we need to change your treatment, we will call you to review the results.   Testing/Procedures: NONE ordered at this time of appointment     Follow-Up: At [facility], you and your health needs are our priority.  As part of our continuing mission to provide you with exceptional heart care, we have created designated Provider Care Teams.  These Care Teams include your primary Cardiologist (physician) and Advanced Practice Providers (APPs -  Physician Assistants and Nurse Practitioners) who all work together to provide you with the care you need, when you need it.  We recommend signing up for the patient portal called "MyChart".  Sign up information is provided on this After Visit Summary.  MyChart is used to connect with patients for Virtual Visits (Telemedicine).  Patients are able to view lab/test results, encounter notes, upcoming appointments, etc.  Non-urgent messages can be sent to your provider as well.   To learn more about what you can do with MyChart, go to [email or web address].    Your next appointment:   3 month(s)  Provider:   Shelva Majestic, MD     Other Instructions

## 2022-08-26 NOTE — Progress Notes (Signed)
Office Visit    Patient Name: Stephen Avila Date of Encounter: 08/26/2022  Primary Care Provider:  [name], MD Primary Cardiologist:  Shelva Majestic, MD  Chief Complaint    47 year old male with the above past medical history including CAD (s/p STEMI, DES-OM2 in 11/2019), hypertension, hyperlipidemia, peptic ulcer disease, central hypopituitarism, and prostate cancer who presents for follow-up related to CAD.   Past Medical History    Past Medical History:  Diagnosis Date   Anxiety    Constipation    GERD (gastroesophageal reflux disease)    Heart murmur    History of chicken pox    Hypertension    Kidney stones    PUD (peptic ulcer disease)    Past Surgical History:  Procedure Laterality Date   CHOLECYSTECTOMY N/A 06/26/2015   Procedure: LAPAROSCOPIC CHOLECYSTECTOMY;  Surgeon: [name], MD;  Location: [facility];  Service: General;  Laterality: N/A;   COLONOSCOPY WITH PROPOFOL N/A 05/07/2019   Procedure: COLONOSCOPY WITH PROPOFOL;  Surgeon: [name], MD;  Location: [facility];  Service: Gastroenterology;  Laterality: N/A;   CORONARY/GRAFT ACUTE MI REVASCULARIZATION N/A 11/07/2019   Procedure: Coronary/Graft Acute MI Revascularization;  Surgeon: [name], MD;  Location: [facility];  Service: Cardiovascular;  Laterality: N/A;   ESOPHAGOGASTRODUODENOSCOPY N/A 05/07/2019   Procedure: ESOPHAGOGASTRODUODENOSCOPY (EGD);  Surgeon: [name], MD;  Location: [facility];  Service: Gastroenterology;  Laterality: N/A;   HERNIA REPAIR  1979, 1981   x 2    LEFT HEART CATH AND CORONARY ANGIOGRAPHY N/A 11/07/2019   Procedure: LEFT HEART CATH AND CORONARY ANGIOGRAPHY;  Surgeon: [name], MD;  Location: [place];  Service: Cardiovascular;  Laterality: N/A;   LITHOTRIPSY     multiple    Allergies  Allergies  Allergen Reactions   Erythromycin     Made chest feel "funny"     Labs/Other Studies Reviewed    The  following studies were reviewed today: Lexiscan Myoview 02/20/2022:   The study is normal. The study is low risk.   No ST deviation was noted.   Nuclear stress EF: 53 %. The left ventricular ejection fraction is mildly decreased (45-54%). End diastolic cavity size is normal. Recommend TTE correlation if clinically indicated   Prior study not available for comparison.    Echo 11/07/2019: IMPRESSIONS    1. Normal LV systolic function; mild LVH; grade 2 diastolic dysfunction;  mild LAE.   2. Left ventricular ejection fraction, by estimation, is 60 to 65%. The  left ventricle has normal function. The left ventricle has no regional  wall motion abnormalities. There is mild left ventricular hypertrophy.  Left ventricular diastolic parameters  are consistent with Grade II diastolic dysfunction (pseudonormalization).   3. Right ventricular systolic function is normal. The right ventricular  size is normal.   4. Left atrial size was mildly dilated.   5. The mitral valve is normal in structure. Trivial mitral valve  regurgitation. No evidence of mitral stenosis.   6. The aortic valve is tricuspid. Aortic valve regurgitation is not  visualized. Mild aortic valve sclerosis is present, with no evidence of  aortic valve stenosis.   7. The inferior vena cava is normal in size with greater than 50%  respiratory variability, suggesting right atrial pressure of 3 mmHg.    LHC 11/07/2019: Prox RCA lesion is 70% stenosed. 1st Mrg lesion is 70% stenosed. 2nd Mrg lesion is 100% stenosed.   Acute ST segment elevation myocardial infarction  secondary to total occlusion of a large OM 2 vessel in a left dominant circulation.   The left main is very short and immediately bifurcates into a normal large LAD vessel without significant obstruction; circumflex vessel gives rise to a high marginal OM1 which has a ramus distribution and has diffuse 70% proximal narrowing, the OM 2 vessel is totally occluded with TIMI 0  flow and the distal circumflex supplies the inferolateral and posterior lateral wall; small nondominant RCA with diffuse 60-70% proximal to mid irregularity/stenosis.   Preserved global LV contractility with EF estimated at 55% with possible subtle focal inferior hypocontractility.   Successful PCI of the OM 2 vessel with PTCA and ultimate stenting with a 3.0 x 22 mm Resolute DES stent dilated to 3.1 mm with 100% occlusion being reduced to 0% and resumption of brisk TIMI-3 flow.   RECOMMENDATION:  DAPT for minimum of 12 months.  Initial adequate therapy for concomitant CAD of his large high marginal branch and small RCA vessel.  Initiate atorvastatin 80 mg for aggressive lipid-lowering therapy and attempt to induce plaque regression with target LDL less than 70 and preferably in the fifties or below.  Optimal blood pressure control.  Recent Labs: 12/28/2021: ALT 17; BUN 14; Creatinine, Ser 1.03; Hemoglobin 18.3; Platelets 302; Potassium 4.1; Sodium 140  Recent Lipid Panel    Component Value Date/Time   CHOL 181 12/11/2020 0904   CHOL 184 12/11/2020 0904   TRIG 102 12/11/2020 0904   TRIG 110 12/11/2020 0904   HDL 40 12/11/2020 0904   HDL 40 12/11/2020 0904   CHOLHDL 4.5 12/11/2020 0904   CHOLHDL 4.6 12/11/2020 0904   CHOLHDL 4.5 11/07/2019 0304   VLDL 34 11/07/2019 0304   LDLCALC 122 (H) 12/11/2020 0904   LDLCALC 124 (H) 12/11/2020 0904    History of Present Illness    47 year old male with the above past medical history including CAD (s/p STEMI, DES-OM2 in 11/2019), hypertension, hyperlipidemia, peptic ulcer disease, central hypopituitarism, and prostate cancer.   He presented to [facility] ED in 11/2019 in the setting of STEMI. Cardiac catheterization  showed total occlusion of OM2 vessel treated with a DES (on DAPT with ASA and Brilinta), as well as a 70% OM1 stenosis and 70% proximal RCA stenosis (small vessel), managed medically. Echocardiogram at the time showed an EF of 60 to 65%, normal  LV systolic function, mild LVH, G2DD, and mild LAE. He was last seen in the office on 07/11/2021 and was stable overall from a cardiac standpoint.  He did note some mildly elevated BP readings at home, fleeting palpitations, as well as significant anxiety. Lexiscan Myoview in 02/2022 in the setting of preoperative cardiac evaluation was low risk, no evidence of ischemia, EF 53%. He contacted our office on 07/10/2022 with complaints of tingling in his left arm and hand off and on for the past 6 months.   He presents today for routine follow-up. Since his last visit he has been stable overall from a cardiac standpoint.  He had back surgery in 03/2022.  He underwent seed implantation for treatment of prostate cancer in 05/2022.  Since increasing his amlodipine to 10 mg daily, he has noted some mild dependent nonpitting bilateral ankle edema well as mild swelling in his hands at times.  He denies any dyspnea, PND, orthopnea, weight gain.  He notes that his symptoms of left arm tingling have improved, he thinks that this was related to cervical radiculopathy.  He is no longer taking aspirin (patient states  he was not told to take aspirin as he has a history of GI bleed).  He has been taking Brilinta 90 mg twice daily.  He continues to experience daily anxiety.  He plans to follow-up with his psychologist/PCP for this.  Other than his dependent edema, he reports feeling well and denies any additional concerns today.  Home Medications    Current Outpatient Medications  Medication Sig Dispense Refill   amLODipine (NORVASC) 10 MG tablet Take 1 tablet (10 mg total) by mouth daily. 90 tablet 3   amphetamine-dextroamphetamine (ADDERALL) 10 MG tablet Take 10 mg by mouth as needed.     atorvastatin (LIPITOR) 80 MG tablet Take 1 tablet (80 mg total) by mouth daily. 90 tablet 3   buprenorphine (SUBUTEX) 8 MG SUBL SL tablet Place 20 mg under the tongue daily.      carvedilol (COREG) 6.25 MG tablet TAKE 2 TABLETS(12.5 MG) BY  MOUTH TWICE DAILY WITH A MEAL 360 tablet 3   ibuprofen (ADVIL) 600 MG tablet Take 600 mg by mouth every 6 (six) hours as needed.     losartan (COZAAR) 50 MG tablet TAKE 1 TABLET(50 MG) BY MOUTH DAILY 90 tablet 3   nitroGLYCERIN (NITROSTAT) 0.4 MG SL tablet Place 1 tablet (0.4 mg total) under the tongue every 5 (five) minutes x 3 doses as needed for chest pain. 25 tablet 3   tamsulosin (FLOMAX) 0.4 MG CAPS capsule Take 2 capsules (0.8 mg total) by mouth daily after supper. 180 capsule 3   ticagrelor (BRILINTA) 90 MG TABS tablet Take 1 tablet (90 mg total) by mouth 2 (two) times daily. 180 tablet 3   No current facility-administered medications for this visit.   Facility-Administered Medications Ordered in Other Visits  Medication Dose Route Frequency Provider Last Rate Last Admin   regadenoson (LEXISCAN) injection SOLN 0.4 mg  0.4 mg Intravenous Once Janina Mayo, MD       technetium tetrofosmin (TC-MYOVIEW) injection [id number] millicurie  [id number] millicurie Intravenous Once PRN Janina Mayo, MD         Review of Systems    He denies chest pain, palpitations, dyspnea, pnd, orthopnea, n, v, dizziness, syncope, edema, weight gain, or early satiety. All other systems reviewed and are otherwise negative except as noted above.   Physical Exam    VS:  BP 138/82   Pulse 90   Ht 5' 6"$  (1.676 m)   Wt 192 lb (87.1 kg)   SpO2 98%   BMI 30.99 kg/m  GEN: Well nourished, well developed, in no acute distress. HEENT: normal. Neck: Supple, no JVD, carotid bruits, or masses. Cardiac: RRR, no murmurs, rubs, or gallops. No clubbing, cyanosis, edema.  Radials/DP/PT 2+ and equal bilaterally.  Respiratory:  Respirations regular and unlabored, clear to auscultation bilaterally. GI: Soft, nontender, nondistended, BS + x 4. MS: no deformity or atrophy. Skin: warm and dry, no rash. Neuro:  Strength and sensation are intact. Psych: Normal affect.  Accessory Clinical Findings    ECG personally reviewed by me  today -NSR, 80 bpm, nonspecific ST/T wave abnormality- no acute changes.   Lab Results  Component Value Date   WBC 9.8 12/28/2021   HGB 18.3 (H) 12/28/2021   HCT 54.0 (H) 12/28/2021   MCV 90.6 12/28/2021   PLT 302 12/28/2021   Lab Results  Component Value Date   CREATININE 1.03 12/28/2021   BUN 14 12/28/2021   NA 140 12/28/2021   K 4.1 12/28/2021   CL 104 12/28/2021  CO2 26 12/28/2021   Lab Results  Component Value Date   ALT 17 12/28/2021   AST 18 12/28/2021   ALKPHOS 76 12/28/2021   BILITOT 1.1 12/28/2021   Lab Results  Component Value Date   CHOL 181 12/11/2020   CHOL 184 12/11/2020   HDL 40 12/11/2020   HDL 40 12/11/2020   LDLCALC 122 (H) 12/11/2020   LDLCALC 124 (H) 12/11/2020   TRIG 102 12/11/2020   TRIG 110 12/11/2020   CHOLHDL 4.5 12/11/2020   CHOLHDL 4.6 12/11/2020    Lab Results  Component Value Date   HGBA1C 4.6 (L) 11/07/2019    Assessment & Plan    1. CAD: S/p STEMI, DES OM2 in 11/2019 (on DAPT with ASA and Brilinta). Residual disease including 70% OM1 stenosis and 70% proximal RCA stenosis (small vessel), managed medically. Echo at the time showed an EF of 60 to 65%, normal LV systolic function, mild LVH, G2DD, and mild LAE.  Myoview in 02/2022 was negative for ischemia.  Stable with no anginal symptoms. He is no longer taking aspirin (patient states he was told he could stop aspirin given history of GI bleed).  He has been taking Brilinta 90 mg twice daily.  I will reach out to Dr. Claiborne Billings to ask about transitioning off of Brilinta to maintenance aspirin, versus maintenance dose of Brilinta.. For now, continue Brilinta, carvedilol, olmesartan as below, amlodipine as below, and Lipitor.   2. Grade 2 diastolic dysfunction: Most recent echo as above.  Mild nonpitting dependent bilateral ankle edema (this began after his amlodipine was increased to 10 mg daily).  Euvolemic and well compensated on exam.  Will decrease amlodipine to 5 mg daily to see if this  helps with his swelling.  Continue current medication as above.   3. Hypertension: BP elevated slightly above goal in office today.  He has had some ankle edema with increased amlodipine dosing.  Will decrease amlodipine to 5 mg daily.  Will stop losartan and start olmesartan 40 mg daily.  Plan for CMET in 2 weeks.  I encouraged him to keep a blood pressure log and report blood pressure consistently greater than 130/80. Otherwise, continue current antihypertensive regimen.    4. Hyperlipidemia: LDL was 124 on 12/11/2020.  We will have him repeat fasting lipids, LFTs. For now, continue atorvastatin.  If LDL remains elevated, will consider adding Zetia.   5. Anxiety: Ongoing.  Follow-up with Conception Chancy, psychologist/PCP for further management.     6. Disposition: Follow-up in 3 months.      Lenna Sciara, NP 08/26/2022, 4:10 PM

## 2022-09-24 ENCOUNTER — Other Ambulatory Visit: Payer: BC Managed Care – PPO

## 2022-09-24 DIAGNOSIS — N401 Enlarged prostate with lower urinary tract symptoms: Secondary | ICD-10-CM | POA: Diagnosis not present

## 2022-09-24 DIAGNOSIS — C61 Malignant neoplasm of prostate: Secondary | ICD-10-CM | POA: Diagnosis not present

## 2022-09-25 ENCOUNTER — Encounter: Payer: Self-pay | Admitting: Urology

## 2022-09-25 ENCOUNTER — Ambulatory Visit (INDEPENDENT_AMBULATORY_CARE_PROVIDER_SITE_OTHER): Payer: BC Managed Care – PPO | Admitting: Urology

## 2022-09-25 ENCOUNTER — Ambulatory Visit: Payer: BC Managed Care – PPO | Admitting: Urology

## 2022-09-25 VITALS — BP 149/91 | HR 76 | Ht 66.0 in | Wt 195.0 lb

## 2022-09-25 DIAGNOSIS — C61 Malignant neoplasm of prostate: Secondary | ICD-10-CM | POA: Diagnosis not present

## 2022-09-25 DIAGNOSIS — R399 Unspecified symptoms and signs involving the genitourinary system: Secondary | ICD-10-CM

## 2022-09-25 DIAGNOSIS — Z125 Encounter for screening for malignant neoplasm of prostate: Secondary | ICD-10-CM

## 2022-09-25 DIAGNOSIS — N3281 Overactive bladder: Secondary | ICD-10-CM | POA: Diagnosis not present

## 2022-09-25 LAB — PSA: Prostate Specific Ag, Serum: 1.3 ng/mL (ref 0.0–4.0)

## 2022-09-25 LAB — BLADDER SCAN AMB NON-IMAGING

## 2022-09-25 MED ORDER — MIRABEGRON ER 50 MG PO TB24: 50.0000 mg | ORAL_TABLET | Freq: Every day | ORAL | 0 refills | Status: DC

## 2022-09-25 NOTE — Progress Notes (Signed)
   09/25/2022 12:15 PM   Stephen Avila March 17, 1976 [id number]  Reason for visit: Follow up prostate cancer, urinary symptoms  HPI: 47 year old male who was diagnosed with prostate cancer in November 2022 with an elevated PSA of 6.1, and favorable intermediate risk disease.  He was seen by a urologist at [facility] in [place] to request HIFU, but this was not covered by insurance and he ultimately underwent brachytherapy in November 2023.  He did not get any ADT.  He was unhappy with his care there and would like to transfer his urologic care back to [place].  I was able to review the outside operative note from his brachytherapy procedure.    Initial postirradiation PSA on 09/24/2022 was 1.3.  We discussed the importance of monitoring the PSA trend, and concept of a PSA bounce after brachytherapy.  At our visit in January 2024 he was having bothersome urinary symptoms with primarily frequency and nocturia every hour overnight.  No dysuria or gross hematuria.  UAs have been benign.  PVRs have been normal, including 178ml today.  He continues to drink primarily soda during the day.  He was previously trialed on Gemtesa by his outside urologist for about 2 weeks with no significant improvement.  We increased his Flomax to 0.8 mg nightly, but he does not feel this has improved his symptoms significantly.  I recommended a trial of Myrbetriq for his persistent urinary symptoms after radiation(4 weeks samples given, okay to fill prescription if helpful).  We reviewed possible other etiologies like urethral stricture, and would consider cystoscopy at follow-up if persistent bothersome urinary symptoms.  Continue Flomax.  Will also recheck PSA at 3 to 78-month follow-up.   Billey Co, [facility] [address], [place], [place] [address] [phone]

## 2022-11-18 ENCOUNTER — Ambulatory Visit: Payer: BC Managed Care – PPO | Admitting: Nurse Practitioner

## 2022-11-18 ENCOUNTER — Telehealth: Payer: Self-pay | Admitting: Cardiovascular Disease

## 2022-11-18 ENCOUNTER — Encounter: Payer: Self-pay | Admitting: Nurse Practitioner

## 2022-11-18 NOTE — Telephone Encounter (Signed)
Pt c/o swelling: STAT is pt has developed SOB within 24 hours  If swelling, where is the swelling located?   Legs and left hand  How much weight have you gained and in what time span? Yes  Have you gained 3 pounds in a day or 5 pounds in a week?   No  Do you have a log of your daily weights (if so, list)?   Yes  Are you currently taking a fluid pill?   Yes  Are you currently SOB?   Yes  Have you traveled recently?  No   Wife states patient has had these symptoms for a while and is concerned his medication is not working.

## 2022-11-18 NOTE — Telephone Encounter (Signed)
Stephen Avila answers the phone.  She states it has been addressed before with [name] in ankles and hands.  Changed meds but still continues with swelling.  He had an appt but was cancelled today by our office. States maybe a little better but looks the same. Swelling in hands and more predominant in right ankle. Pitting edema. No compression stockings. Non elevating during the day.  Sodium intake "could be better". No daily weights.  No SOB.  Girlfriend states it is worse, While he is away from the phone.  Advised to take BP log  in the morning before meds and 2 hours after. Elevate legs and hands during the day when possible.  Watch sodium intake.  Advised will let him know if any changes prior to appt.   Patient appt. Made for May 30.

## 2022-11-18 NOTE — Telephone Encounter (Signed)
Attempted to contact patient number below. LVM to call back.   Left call back number.

## 2022-11-19 NOTE — Telephone Encounter (Signed)
Discussed with patient wife instructions from provider.  She states understanding and will keep log of BP.  Will notify if any concerns prior to appt May 30

## 2022-12-05 ENCOUNTER — Ambulatory Visit: Payer: BC Managed Care – PPO | Admitting: Nurse Practitioner

## 2022-12-05 NOTE — Progress Notes (Deleted)
Office Visit    Patient Name: Stephen Avila Date of Encounter: 12/05/2022  Primary Care Provider:  Eustaquio Boyden, MD Primary Cardiologist:  Nicki Guadalajara, MD  Chief Complaint    47 year old male with the above past medical history including CAD (s/p STEMI, DES-OM2 in 11/2019), hypertension, hyperlipidemia, peptic ulcer disease, central hypopituitarism, and prostate cancer who presents for follow-up related to CAD.   Past Medical History    Past Medical History:  Diagnosis Date   Anxiety    Constipation    GERD (gastroesophageal reflux disease)    Heart murmur    History of chicken pox    Hypertension    Kidney stones    PUD (peptic ulcer disease)    Past Surgical History:  Procedure Laterality Date   CHOLECYSTECTOMY N/A 06/26/2015   Procedure: LAPAROSCOPIC CHOLECYSTECTOMY;  Surgeon: Franky Macho, MD;  Location: [facility];  Service: General;  Laterality: N/A;   COLONOSCOPY WITH PROPOFOL N/A 05/07/2019   Procedure: COLONOSCOPY WITH PROPOFOL;  Surgeon: Toney Reil, MD;  Location: [facility] ENDOSCOPY;  Service: Gastroenterology;  Laterality: N/A;   CORONARY/GRAFT ACUTE MI REVASCULARIZATION N/A 11/07/2019   Procedure: Coronary/Graft Acute MI Revascularization;  Surgeon: Lennette Bihari, MD;  Location: [facility] INVASIVE CV LAB;  Service: Cardiovascular;  Laterality: N/A;   ESOPHAGOGASTRODUODENOSCOPY N/A 05/07/2019   Procedure: ESOPHAGOGASTRODUODENOSCOPY (EGD);  Surgeon: Toney Reil, MD;  Location: [facility] ENDOSCOPY;  Service: Gastroenterology;  Laterality: N/A;   HERNIA REPAIR  1979, 1981   x 2    LEFT HEART CATH AND CORONARY ANGIOGRAPHY N/A 11/07/2019   Procedure: LEFT HEART CATH AND CORONARY ANGIOGRAPHY;  Surgeon: Lennette Bihari, MD;  Location: [facility] INVASIVE CV LAB;  Service: Cardiovascular;  Laterality: N/A;   LITHOTRIPSY     multiple    Allergies  Allergies  Allergen Reactions   Erythromycin     Made chest feel "funny"     Labs/Other Studies Reviewed    The  following studies were reviewed today:  Cardiac Studies & Procedures   CARDIAC CATHETERIZATION  CARDIAC CATHETERIZATION 11/07/2019  Narrative  Prox RCA lesion is 70% stenosed.  1st Mrg lesion is 70% stenosed.  2nd Mrg lesion is 100% stenosed.  Acute ST segment elevation myocardial infarction secondary to total occlusion of a large OM 2 vessel in a left dominant circulation.  The left main is very short and immediately bifurcates into a normal large LAD vessel without significant obstruction; circumflex vessel gives rise to a high marginal OM1 which has a ramus distribution and has diffuse 70% proximal narrowing, the OM 2 vessel is totally occluded with TIMI 0 flow and the distal circumflex supplies the inferolateral and posterior lateral wall; small nondominant RCA with diffuse 60-70% proximal to mid irregularity/stenosis.  Preserved global LV contractility with EF estimated at 55% with possible subtle focal inferior hypocontractility.  Successful PCI of the OM 2 vessel with PTCA and ultimate stenting with a 3.0 x 22 mm Resolute DES stent dilated to 3.1 mm with 100% occlusion being reduced to 0% and resumption of brisk TIMI-3 flow.  RECOMMENDATION: DAPT for minimum of 12 months.  Initial adequate therapy for concomitant CAD of his large high marginal branch and small RCA vessel.  Initiate atorvastatin 80 mg for aggressive lipid-lowering therapy and attempt to induce plaque regression with target LDL less than 70 and preferably in the fifties or below.  Optimal blood pressure control.  Findings Coronary Findings Diagnostic  Dominance: Left  Left Circumflex  First Obtuse Marginal Branch 1st Mrg  lesion is 70% stenosed.  Second Obtuse Marginal Branch 2nd Mrg lesion is 100% stenosed.  Right Coronary Artery Vessel is small. Prox RCA lesion is 70% stenosed.  Intervention  No interventions have been documented.   STRESS TESTS  MYOCARDIAL PERFUSION IMAGING  02/20/2022  Narrative   The study is normal. The study is low risk.   No ST deviation was noted.   Nuclear stress EF: 53 %. The left ventricular ejection fraction is mildly decreased (45-54%). End diastolic cavity size is normal. Recommend TTE correlation if clinically indicated   Prior study not available for comparison.   ECHOCARDIOGRAM  ECHOCARDIOGRAM COMPLETE 11/07/2019  Narrative ECHOCARDIOGRAM REPORT    Patient Name:   Stephen Avila Date of Exam: 11/07/2019 Medical Rec #:  [id number]        Height:       66.0 in Accession #:    [id number]       Weight:       188.1 lb Date of Birth:  11-22-1975        BSA:          1.948 m Patient Age:    44 years         BP:           118/76 mmHg Patient Gender: M                HR:           78 bpm. Exam Location:  Inpatient  Procedure: 2D Echo, Cardiac Doppler and Color Doppler  Indications:    Acute Myocardial Infarction 410  History:        Patient has prior history of Echocardiogram examinations, most recent 05/06/2019. Risk Factors:Hypertension and GERD.  Sonographer:    Elmarie Shiley Dance Referring Phys: [id number] KELLY E ARPS  IMPRESSIONS   1. Normal LV systolic function; mild LVH; grade 2 diastolic dysfunction; mild LAE. 2. Left ventricular ejection fraction, by estimation, is 60 to 65%. The left ventricle has normal function. The left ventricle has no regional wall motion abnormalities. There is mild left ventricular hypertrophy. Left ventricular diastolic parameters are consistent with Grade II diastolic dysfunction (pseudonormalization). 3. Right ventricular systolic function is normal. The right ventricular size is normal. 4. Left atrial size was mildly dilated. 5. The mitral valve is normal in structure. Trivial mitral valve regurgitation. No evidence of mitral stenosis. 6. The aortic valve is tricuspid. Aortic valve regurgitation is not visualized. Mild aortic valve sclerosis is present, with no evidence of aortic valve  stenosis. 7. The inferior vena cava is normal in size with greater than 50% respiratory variability, suggesting right atrial pressure of 3 mmHg.  FINDINGS Left Ventricle: Left ventricular ejection fraction, by estimation, is 60 to 65%. The left ventricle has normal function. The left ventricle has no regional wall motion abnormalities. The left ventricular internal cavity size was normal in size. There is mild left ventricular hypertrophy. Left ventricular diastolic parameters are consistent with Grade II diastolic dysfunction (pseudonormalization).  Right Ventricle: The right ventricular size is normal. Right ventricular systolic function is normal.  Left Atrium: Left atrial size was mildly dilated.  Right Atrium: Right atrial size was normal in size.  Pericardium: There is no evidence of pericardial effusion.  Mitral Valve: The mitral valve is normal in structure. Normal mobility of the mitral valve leaflets. Trivial mitral valve regurgitation. No evidence of mitral valve stenosis.  Tricuspid Valve: The tricuspid valve is normal in structure. Tricuspid valve regurgitation is trivial. No evidence  of tricuspid stenosis.  Aortic Valve: The aortic valve is tricuspid. Aortic valve regurgitation is not visualized. Mild aortic valve sclerosis is present, with no evidence of aortic valve stenosis.  Pulmonic Valve: The pulmonic valve was normal in structure. Pulmonic valve regurgitation is not visualized. No evidence of pulmonic stenosis.  Aorta: The aortic root is normal in size and structure.  Venous: The inferior vena cava is normal in size with greater than 50% respiratory variability, suggesting right atrial pressure of 3 mmHg.  IAS/Shunts: No atrial level shunt detected by color flow Doppler.  Additional Comments: Normal LV systolic function; mild LVH; grade 2 diastolic dysfunction; mild LAE.   LEFT VENTRICLE PLAX 2D LVIDd:         4.41 cm  Diastology LVIDs:         2.87 cm  LV e'  lateral:   8.16 cm/s LV PW:         1.24 cm  LV E/e' lateral: 10.5 LV IVS:        0.80 cm  LV e' medial:    7.83 cm/s LVOT diam:     1.90 cm  LV E/e' medial:  11.0 LV SV:         84 LV SV Index:   43 LVOT Area:     2.84 cm   RIGHT VENTRICLE             IVC RV Basal diam:  2.61 cm     IVC diam: 1.92 cm RV S prime:     18.50 cm/s TAPSE (M-mode): 2.3 cm  LEFT ATRIUM             Index       RIGHT ATRIUM           Index LA diam:        4.30 cm 2.21 cm/m  RA Area:     14.10 cm LA Vol (A2C):   94.0 ml 48.25 ml/m RA Volume:   34.50 ml  17.71 ml/m LA Vol (A4C):   54.5 ml 27.98 ml/m LA Biplane Vol: 72.7 ml 37.32 ml/m AORTIC VALVE LVOT Vmax:   160.00 cm/s LVOT Vmean:  97.800 cm/s LVOT VTI:    0.298 m  AORTA Ao Root diam: 2.90 cm Ao Asc diam:  3.00 cm  MITRAL VALVE MV Area (PHT): 3.31 cm    SHUNTS MV Decel Time: 229 msec    Systemic VTI:  0.30 m MV E velocity: 85.90 cm/s  Systemic Diam: 1.90 cm MV A velocity: 76.60 cm/s MV E/A ratio:  1.12  Olga Millers MD Electronically signed by Olga Millers MD Signature Date/Time: 11/07/2019/2:45:42 PM    Final            Recent Labs: 12/28/2021: ALT 17; BUN 14; Creatinine, Ser 1.03; Hemoglobin 18.3; Platelets 302; Potassium 4.1; Sodium 140  Recent Lipid Panel    Component Value Date/Time   CHOL 181 12/11/2020 0904   CHOL 184 12/11/2020 0904   TRIG 102 12/11/2020 0904   TRIG 110 12/11/2020 0904   HDL 40 12/11/2020 0904   HDL 40 12/11/2020 0904   CHOLHDL 4.5 12/11/2020 0904   CHOLHDL 4.6 12/11/2020 0904   CHOLHDL 4.5 11/07/2019 0304   VLDL 34 11/07/2019 0304   LDLCALC 122 (H) 12/11/2020 0904   LDLCALC 124 (H) 12/11/2020 0904    History of Present Illness    47 year old male with the above past medical history including CAD (s/p STEMI, DES-OM2 in 11/2019), hypertension, hyperlipidemia, peptic ulcer disease,  central hypopituitarism, and prostate cancer.   He presented to [facility] ED in 11/2019 in the setting of STEMI. Cardiac  catheterization  showed total occlusion of OM2 vessel treated with a DES (on DAPT with ASA and Brilinta), as well as a 70% OM1 stenosis and 70% proximal RCA stenosis (small vessel), managed medically. Echocardiogram at the time showed an EF of 60 to 65%, normal LV systolic function, mild LVH, G2DD, and mild LAE. He was last seen in the office on 07/11/2021 and was stable overall from a cardiac standpoint.  He did note some mildly elevated BP readings at home, fleeting palpitations, as well as significant anxiety. Lexiscan Myoview in 02/2022 in the setting of preoperative cardiac evaluation was low risk, no evidence of ischemia, EF 53%. H stable in the office on 08/26/2022 and noted mild dependent nonpitting bilateral ankle edema, swelling in his hands.  Amlodipine was decreased to 5 mg daily.  He was transitioned from losartan to olmesartan.  He contacted our office on 11/18/2022 and noted ongoing swelling in his hands and ankles.  Amlodipine was ultimately discontinued.  He was advised to keep a BP log.   He presents today for follow-up. Since his last visit he has been  1. CAD: S/p STEMI, DES OM2 in 11/2019 (on DAPT with ASA and Brilinta). Residual disease including 70% OM1 stenosis and 70% proximal RCA stenosis (small vessel), managed medically. Echo at the time showed an EF of 60 to 65%, normal LV systolic function, mild LVH, G2DD, and mild LAE.  Myoview in 02/2022 was negative for ischemia.  Stable with no anginal symptoms. He is no longer taking aspirin (patient states he was told he could stop aspirin given history of GI bleed).  He has been taking Brilinta 90 mg twice daily.  I will reach out to Dr. Tresa Endo to ask about transitioning off of Brilinta to maintenance aspirin, versus maintenance dose of Brilinta.. For now, continue Brilinta, carvedilol, olmesartan as below, amlodipine as below, and Lipitor.   2. Grade 2 diastolic dysfunction: Most recent echo as above.  Mild nonpitting dependent bilateral ankle  edema (this began after his amlodipine was increased to 10 mg daily).  Euvolemic and well compensated on exam.  Will decrease amlodipine to 5 mg daily to see if this helps with his swelling.  Continue current medication as above.   3. Hypertension: BP elevated slightly above goal in office today.  He has had some ankle edema with increased amlodipine dosing.  Will decrease amlodipine to 5 mg daily.  Will stop losartan and start olmesartan 40 mg daily.  Plan for CMET in 2 weeks.  I encouraged him to keep a blood pressure log and report blood pressure consistently greater than 130/80. Otherwise, continue current antihypertensive regimen.    4. Hyperlipidemia: LDL was 124 on 12/11/2020.  We will have him repeat fasting lipids, LFTs. For now, continue atorvastatin.  If LDL remains elevated, will consider adding Zetia.   5. Anxiety: Ongoing.  Follow-up with Hilbert Corrigan, psychologist/PCP for further management.     6. Disposition: Follow-up in    Home Medications    Current Outpatient Medications  Medication Sig Dispense Refill   amLODipine (NORVASC) 5 MG tablet Take 1 tablet (5 mg total) by mouth daily. 90 tablet 3   amphetamine-dextroamphetamine (ADDERALL) 10 MG tablet Take 10 mg by mouth as needed.     atorvastatin (LIPITOR) 80 MG tablet Take 1 tablet (80 mg total) by mouth daily. 90 tablet 3   buprenorphine (SUBUTEX)  8 MG SUBL SL tablet Place 20 mg under the tongue daily.      carvedilol (COREG) 6.25 MG tablet TAKE 2 TABLETS(12.5 MG) BY MOUTH TWICE DAILY WITH A MEAL 360 tablet 3   ibuprofen (ADVIL) 600 MG tablet Take 600 mg by mouth every 6 (six) hours as needed.     mirabegron ER (MYRBETRIQ) 50 MG TB24 tablet Take 1 tablet (50 mg total) by mouth daily. 30 tablet 0   nitroGLYCERIN (NITROSTAT) 0.4 MG SL tablet Place 1 tablet (0.4 mg total) under the tongue every 5 (five) minutes x 3 doses as needed for chest pain. 25 tablet 3   olmesartan (BENICAR) 40 MG tablet Take 1 tablet (40 mg total) by  mouth daily. 90 tablet 3   tamsulosin (FLOMAX) 0.4 MG CAPS capsule Take 2 capsules (0.8 mg total) by mouth daily after supper. 180 capsule 3   ticagrelor (BRILINTA) 90 MG TABS tablet Take 1 tablet (90 mg total) by mouth 2 (two) times daily. 180 tablet 3   No current facility-administered medications for this visit.   Facility-Administered Medications Ordered in Other Visits  Medication Dose Route Frequency Provider Last Rate Last Admin   regadenoson (LEXISCAN) injection SOLN 0.4 mg  0.4 mg Intravenous Once Maisie Fus, MD       technetium tetrofosmin (TC-MYOVIEW) injection 30.8 millicurie  30.8 millicurie Intravenous Once PRN Maisie Fus, MD         Review of Systems    ***.  All other systems reviewed and are otherwise negative except as noted above.    Physical Exam    VS:  There were no vitals taken for this visit. , BMI There is no height or weight on file to calculate BMI.     GEN: Well nourished, well developed, in no acute distress. HEENT: normal. Neck: Supple, no JVD, carotid bruits, or masses. Cardiac: RRR, no murmurs, rubs, or gallops. No clubbing, cyanosis, edema.  Radials/DP/PT 2+ and equal bilaterally.  Respiratory:  Respirations regular and unlabored, clear to auscultation bilaterally. GI: Soft, nontender, nondistended, BS + x 4. MS: no deformity or atrophy. Skin: warm and dry, no rash. Neuro:  Strength and sensation are intact. Psych: Normal affect.  Accessory Clinical Findings    ECG personally reviewed by me today - *** - no acute changes.   Lab Results  Component Value Date   WBC 9.8 12/28/2021   HGB 18.3 (H) 12/28/2021   HCT 54.0 (H) 12/28/2021   MCV 90.6 12/28/2021   PLT 302 12/28/2021   Lab Results  Component Value Date   CREATININE 1.03 12/28/2021   BUN 14 12/28/2021   NA 140 12/28/2021   K 4.1 12/28/2021   CL 104 12/28/2021   CO2 26 12/28/2021   Lab Results  Component Value Date   ALT 17 12/28/2021   AST 18 12/28/2021   ALKPHOS 76  12/28/2021   BILITOT 1.1 12/28/2021   Lab Results  Component Value Date   CHOL 181 12/11/2020   CHOL 184 12/11/2020   HDL 40 12/11/2020   HDL 40 12/11/2020   LDLCALC 122 (H) 12/11/2020   LDLCALC 124 (H) 12/11/2020   TRIG 102 12/11/2020   TRIG 110 12/11/2020   CHOLHDL 4.5 12/11/2020   CHOLHDL 4.6 12/11/2020    Lab Results  Component Value Date   HGBA1C 4.6 (L) 11/07/2019    Assessment & Plan    1.  ***  No BP recorded.  {Refresh Note OR Click here to enter BP  :  1}***   Joylene Grapes, NP 12/05/2022, 7:28 AM

## 2022-12-09 ENCOUNTER — Encounter: Payer: Self-pay | Admitting: Intensive Care

## 2022-12-09 ENCOUNTER — Observation Stay
Admission: EM | Admit: 2022-12-09 | Discharge: 2022-12-10 | Disposition: A | Discharge status: Home / Self Care | Payer: BC Managed Care – PPO | Attending: Internal Medicine | Admitting: Internal Medicine

## 2022-12-09 ENCOUNTER — Other Ambulatory Visit: Payer: Self-pay

## 2022-12-09 DIAGNOSIS — W5911XA Bitten by nonvenomous snake, initial encounter: Secondary | ICD-10-CM | POA: Diagnosis present

## 2022-12-09 DIAGNOSIS — K5909 Other constipation: Secondary | ICD-10-CM | POA: Diagnosis present

## 2022-12-09 DIAGNOSIS — E039 Hypothyroidism, unspecified: Secondary | ICD-10-CM | POA: Insufficient documentation

## 2022-12-09 DIAGNOSIS — I2510 Atherosclerotic heart disease of native coronary artery without angina pectoris: Secondary | ICD-10-CM | POA: Diagnosis present

## 2022-12-09 DIAGNOSIS — Z8546 Personal history of malignant neoplasm of prostate: Secondary | ICD-10-CM | POA: Diagnosis not present

## 2022-12-09 DIAGNOSIS — F411 Generalized anxiety disorder: Secondary | ICD-10-CM | POA: Diagnosis present

## 2022-12-09 DIAGNOSIS — T63001A Toxic effect of unspecified snake venom, accidental (unintentional), initial encounter: Secondary | ICD-10-CM | POA: Diagnosis not present

## 2022-12-09 DIAGNOSIS — Z23 Encounter for immunization: Secondary | ICD-10-CM | POA: Diagnosis not present

## 2022-12-09 DIAGNOSIS — F419 Anxiety disorder, unspecified: Secondary | ICD-10-CM | POA: Diagnosis present

## 2022-12-09 DIAGNOSIS — R002 Palpitations: Secondary | ICD-10-CM | POA: Diagnosis not present

## 2022-12-09 DIAGNOSIS — R519 Headache, unspecified: Secondary | ICD-10-CM | POA: Diagnosis not present

## 2022-12-09 DIAGNOSIS — Z955 Presence of coronary angioplasty implant and graft: Secondary | ICD-10-CM | POA: Insufficient documentation

## 2022-12-09 DIAGNOSIS — T63091A Toxic effect of venom of other snake, accidental (unintentional), initial encounter: Secondary | ICD-10-CM | POA: Diagnosis not present

## 2022-12-09 DIAGNOSIS — I10 Essential (primary) hypertension: Secondary | ICD-10-CM | POA: Diagnosis not present

## 2022-12-09 DIAGNOSIS — G894 Chronic pain syndrome: Secondary | ICD-10-CM | POA: Diagnosis present

## 2022-12-09 DIAGNOSIS — F988 Other specified behavioral and emotional disorders with onset usually occurring in childhood and adolescence: Secondary | ICD-10-CM | POA: Diagnosis present

## 2022-12-09 DIAGNOSIS — E876 Hypokalemia: Secondary | ICD-10-CM | POA: Insufficient documentation

## 2022-12-09 DIAGNOSIS — Z79899 Other long term (current) drug therapy: Secondary | ICD-10-CM | POA: Insufficient documentation

## 2022-12-09 DIAGNOSIS — E785 Hyperlipidemia, unspecified: Secondary | ICD-10-CM | POA: Diagnosis present

## 2022-12-09 HISTORY — DX: Malignant neoplasm of prostate: C61

## 2022-12-09 HISTORY — DX: Nonrheumatic mitral (valve) insufficiency: I34.0

## 2022-12-09 LAB — COMPREHENSIVE METABOLIC PANEL
ALT: 21 U/L (ref 0–44)
AST: 29 U/L (ref 15–41)
Albumin: 4 g/dL (ref 3.5–5.0)
Alkaline Phosphatase: 97 U/L (ref 38–126)
Anion gap: 9 (ref 5–15)
BUN: 12 mg/dL (ref 6–20)
CO2: 23 mmol/L (ref 22–32)
Calcium: 9.1 mg/dL (ref 8.9–10.3)
Chloride: 108 mmol/L (ref 98–111)
Creatinine, Ser: 1.03 mg/dL (ref 0.61–1.24)
GFR, Estimated: >60 mL/min (ref >60)
Glucose, Bld: 146 mg/dL — ABNORMAL HIGH (ref 70–99)
Potassium: 3.3 mmol/L — ABNORMAL LOW (ref 3.5–5.1)
Sodium: 140 mmol/L (ref 135–145)
Total Bilirubin: 0.6 mg/dL (ref 0.3–1.2)
Total Protein: 7.6 g/dL (ref 6.5–8.1)

## 2022-12-09 LAB — CBC WITH DIFFERENTIAL/PLATELET
Abs Immature Granulocytes: 0.02 10*3/uL (ref 0.00–0.07)
Basophils Absolute: 0 10*3/uL (ref 0.0–0.1)
Basophils Relative: 0 %
Eosinophils Absolute: 0.2 10*3/uL (ref 0.0–0.5)
Eosinophils Relative: 4 %
HCT: 41.5 % (ref 39.0–52.0)
Hemoglobin: 14 g/dL (ref 13.0–17.0)
Immature Granulocytes: 0 %
Lymphocytes Relative: 25 %
Lymphs Abs: 1.7 10*3/uL (ref 0.7–4.0)
MCH: 29.4 pg (ref 26.0–34.0)
MCHC: 33.7 g/dL (ref 30.0–36.0)
MCV: 87 fL (ref 80.0–100.0)
Monocytes Absolute: 0.4 10*3/uL (ref 0.1–1.0)
Monocytes Relative: 6 %
Neutro Abs: 4.3 10*3/uL (ref 1.7–7.7)
Neutrophils Relative %: 65 %
Platelets: 243 10*3/uL (ref 150–400)
RBC: 4.77 MIL/uL (ref 4.22–5.81)
RDW: 12.7 % (ref 11.5–15.5)
WBC: 6.7 10*3/uL (ref 4.0–10.5)
nRBC: 0 % (ref 0.0–0.2)

## 2022-12-09 LAB — PROTIME-INR
INR: 1.1 (ref 0.8–1.2)
Prothrombin Time: 14.4 seconds (ref 11.4–15.2)

## 2022-12-09 LAB — FIBRINOGEN: Fibrinogen: 394 mg/dL (ref 210–475)

## 2022-12-09 MED ORDER — TETANUS-DIPHTH-ACELL PERTUSSIS 5-2.5-18.5 LF-MCG/0.5 IM SUSY
0.5000 mL | PREFILLED_SYRINGE | Freq: Once | INTRAMUSCULAR | Status: AC
  Administered 2022-12-09: 0.5 mL via INTRAMUSCULAR
  Filled 2022-12-09: qty 0.5

## 2022-12-09 MED ORDER — NITROGLYCERIN 0.4 MG SL SUBL: 0.4000 mg | SUBLINGUAL_TABLET | SUBLINGUAL | Status: DC | PRN

## 2022-12-09 MED ORDER — TAMSULOSIN HCL 0.4 MG PO CAPS: 0.8000 mg | ORAL_CAPSULE | Freq: Every day | ORAL | Status: DC

## 2022-12-09 MED ORDER — ENOXAPARIN SODIUM 60 MG/0.6ML IJ SOSY
0.5000 mg/kg | PREFILLED_SYRINGE | INTRAMUSCULAR | Status: DC
  Administered 2022-12-09: 42.5 mg via SUBCUTANEOUS
  Filled 2022-12-09: qty 0.6

## 2022-12-09 MED ORDER — SODIUM CHLORIDE 0.9 % IV SOLN: INTRAVENOUS | Status: DC

## 2022-12-09 MED ORDER — ACETAMINOPHEN 325 MG PO TABS
650.0000 mg | ORAL_TABLET | Freq: Four times a day (QID) | ORAL | Status: DC | PRN
  Administered 2022-12-10: 650 mg via ORAL
  Filled 2022-12-09: qty 2

## 2022-12-09 MED ORDER — DOCUSATE SODIUM 100 MG PO CAPS
100.0000 mg | ORAL_CAPSULE | Freq: Two times a day (BID) | ORAL | Status: DC
  Administered 2022-12-09: 100 mg via ORAL
  Filled 2022-12-09: qty 1

## 2022-12-09 MED ORDER — ACETAMINOPHEN 650 MG RE SUPP: 650.0000 mg | Freq: Four times a day (QID) | RECTAL | Status: DC | PRN

## 2022-12-09 MED ORDER — IRBESARTAN 75 MG PO TABS
37.5000 mg | ORAL_TABLET | Freq: Every day | ORAL | Status: DC
  Filled 2022-12-09: qty 0.5

## 2022-12-09 MED ORDER — ONDANSETRON HCL 4 MG/2ML IJ SOLN: 4.0000 mg | Freq: Four times a day (QID) | INTRAMUSCULAR | Status: DC | PRN

## 2022-12-09 MED ORDER — CARVEDILOL 6.25 MG PO TABS: 12.5000 mg | ORAL_TABLET | Freq: Two times a day (BID) | ORAL | Status: DC

## 2022-12-09 MED ORDER — TICAGRELOR 90 MG PO TABS: 90.0000 mg | ORAL_TABLET | Freq: Two times a day (BID) | ORAL | Status: DC

## 2022-12-09 MED ORDER — AMLODIPINE BESYLATE 5 MG PO TABS: 5.0000 mg | ORAL_TABLET | Freq: Every day | ORAL | Status: DC

## 2022-12-09 MED ORDER — POTASSIUM CHLORIDE 20 MEQ PO PACK
40.0000 meq | PACK | Freq: Once | ORAL | Status: AC
  Administered 2022-12-09: 40 meq via ORAL
  Filled 2022-12-09: qty 2

## 2022-12-09 MED ORDER — ONDANSETRON HCL 4 MG PO TABS: 4.0000 mg | ORAL_TABLET | Freq: Four times a day (QID) | ORAL | Status: DC | PRN

## 2022-12-09 MED ORDER — SODIUM CHLORIDE 0.9 % IV SOLN
4.0000 | Freq: Once | INTRAVENOUS | Status: AC
  Administered 2022-12-09: 4 via INTRAVENOUS
  Filled 2022-12-09: qty 72

## 2022-12-09 MED ORDER — ATORVASTATIN CALCIUM 20 MG PO TABS
80.0000 mg | ORAL_TABLET | Freq: Every day | ORAL | Status: DC
  Administered 2022-12-10: 80 mg via ORAL
  Filled 2022-12-09: qty 4

## 2022-12-09 NOTE — ED Notes (Signed)
Right arm Measurements: Hand  22.75 cm   Wrist 19.5 cm   Forearm 29 cm   Bicep 34 cm.  No axillary tenderness. Sensation movement and warmth intact. Cap refill < 3 seconds.

## 2022-12-09 NOTE — ED Notes (Addendum)
Poison control notified. Spoke with [employer]. Recommend washing the area with soapy water, drawing line around the swelling and measure every hour the first three hours. After the third hour, move to measuring every 2-3 hours. 6 hour observation with every bite. Elevate straight above heart. Update tetanus shot.  If swelling continuous to progress up the forearm then call back. Recommended labs after 6 hour observation if swelling continues up the arm.

## 2022-12-09 NOTE — ED Triage Notes (Addendum)
Patient believes he got bit by snake on right wrist. Swelling noted to arm.   Swelling outlined by Mya, PA in triage

## 2022-12-09 NOTE — ED Notes (Signed)
Right arm measurements: Hand 22.5  cm   Wrist 19.25 cm   Forearm 29 cm   Bicep 34 cm. No axillary tenderness. Redness does not exceed marked boarder from triage. Cap refill < 3 seconds. Sensation, warmth, movement intact. Radial and ulnar pulse easily palpated.

## 2022-12-09 NOTE — H&P (Addendum)
History and Physical    Stephen Avila UXL:[id number] DOB: 01/28/1976 DOA: 12/09/2022  PCP: Eustaquio Boyden, MD   Patient coming from: Home  I have personally briefly reviewed patient's old medical records in [facility] : [facility]  Chief Complaint:  Snake Bite   HPI: Stephen Avila is a 47 y.o. male with PMH significant for hyperlipidemia, hypertension, pituitary insufficiency, history of CAD with a stent 3 years back, taking Brilinta daily, chronic pain syndrome on Subutex,  ADD taking Adderall presented in the ED with complaints of presumed snakebite to the right wrist.  Patient reports he was wearing gloves and he was putting wood log in the fire, he felt like something sting in his right wrist, has not seen any insect or snake bite.  After he removed the gloves he has noted to bite marks, later he has developed significant swelling and pain to that area associated with a headache and intermittent palpitations.  Swelling has increased and involved the right hand.  Patient states that bite mark has closed over by the time he arrived in ED.  ED Course: He was hemodynamically stable except hypertension. Temp 98.3, HR 76, RR 15, BP 159/92, SpO2 100% on room air Labs include sodium 140, potassium 3.3, chloride 108, bicarb 23, glucose 146, BUN 12, creatinine 1.03, calcium 9.1, anion gap 9, alkaline phosphatase 97, albumin 4.0, AST 29, ALT 21, total protein 7.6, total bilirubin 0.6, WBC 6.7, hemoglobin 14.0, hematocrit 41.5, MCV 87, platelet 243, PT 14.4, INR 1.1, Patient qualified for CroFab.  Patient was given CroFab.  TRH called for overnight observation.   Review of Systems:  Review of Systems  Constitutional: Negative.   HENT: Negative.    Eyes: Negative.   Respiratory: Negative.    Cardiovascular: Negative.   Gastrointestinal: Negative.   Genitourinary: Negative.   Musculoskeletal: Negative.   Skin:        Presumed snake bite  Neurological: Negative.   Endo/Heme/Allergies: Negative.    Psychiatric/Behavioral:  Positive for depression. The patient is nervous/anxious.     Past Medical History:  Diagnosis Date   Anxiety    Constipation    GERD (gastroesophageal reflux disease)    Heart murmur    History of chicken pox    Hypertension    Kidney stones    MI (mitral incompetence)    Prostate cancer (HCC)    PUD (peptic ulcer disease)     Past Surgical History:  Procedure Laterality Date   CHOLECYSTECTOMY N/A 06/26/2015   Procedure: LAPAROSCOPIC CHOLECYSTECTOMY;  Surgeon: Franky Macho, MD;  Location: [facility];  Service: General;  Laterality: N/A;   COLONOSCOPY WITH PROPOFOL N/A 05/07/2019   Procedure: COLONOSCOPY WITH PROPOFOL;  Surgeon: Toney Reil, MD;  Location: [facility] ENDOSCOPY;  Service: Gastroenterology;  Laterality: N/A;   CORONARY/GRAFT ACUTE MI REVASCULARIZATION N/A 11/07/2019   Procedure: Coronary/Graft Acute MI Revascularization;  Surgeon: Lennette Bihari, MD;  Location: [facility] INVASIVE CV LAB;  Service: Cardiovascular;  Laterality: N/A;   ESOPHAGOGASTRODUODENOSCOPY N/A 05/07/2019   Procedure: ESOPHAGOGASTRODUODENOSCOPY (EGD);  Surgeon: Toney Reil, MD;  Location: [facility] ENDOSCOPY;  Service: Gastroenterology;  Laterality: N/A;   HERNIA REPAIR  1979, 1981   x 2    LEFT HEART CATH AND CORONARY ANGIOGRAPHY N/A 11/07/2019   Procedure: LEFT HEART CATH AND CORONARY ANGIOGRAPHY;  Surgeon: Lennette Bihari, MD;  Location: [facility] INVASIVE CV LAB;  Service: Cardiovascular;  Laterality: N/A;   LITHOTRIPSY     multiple     reports that he has  never smoked. He has never been exposed to tobacco smoke. His smokeless tobacco use includes snuff. He reports current alcohol use. He reports that he does not use drugs.  Allergies  Allergen Reactions   Erythromycin     Made chest feel "funny"    Family History  Problem Relation Age of Onset   COPD Mother    Arthritis Mother    Asthma Mother    Lung cancer Mother        smoker   Early death Mother 22   Heart attack  Mother    Heart disease Mother    Learning disabilities Mother    Miscarriages / Stillbirths Mother    Heart failure Father    Diabetes Father    Early death Father 60   Heart attack Father    Learning disabilities Father    Thyroid disease Neg Hx     Family history reviewed and not pertinent.  Prior to Admission medications   Medication Sig Start Date End Date Taking? Authorizing Provider  amLODipine (NORVASC) 5 MG tablet Take 1 tablet (5 mg total) by mouth daily. 08/26/22   Joylene Grapes, NP  amphetamine-dextroamphetamine (ADDERALL) 10 MG tablet Take 10 mg by mouth as needed. 06/04/20   [provider]  atorvastatin (LIPITOR) 80 MG tablet Take 1 tablet (80 mg total) by mouth daily. 07/10/22   Lennette Bihari, MD  buprenorphine (SUBUTEX) 8 MG SUBL SL tablet Place 20 mg under the tongue daily.  09/22/18   [provider]  carvedilol (COREG) 6.25 MG tablet TAKE 2 TABLETS(12.5 MG) BY MOUTH TWICE DAILY WITH A MEAL 07/10/22   Lennette Bihari, MD  ibuprofen (ADVIL) 600 MG tablet Take 600 mg by mouth every 6 (six) hours as needed. 05/28/22   [provider]  mirabegron ER (MYRBETRIQ) 50 MG TB24 tablet Take 1 tablet (50 mg total) by mouth daily. 09/25/22   Sondra Come, MD  nitroGLYCERIN (NITROSTAT) 0.4 MG SL tablet Place 1 tablet (0.4 mg total) under the tongue every 5 (five) minutes x 3 doses as needed for chest pain. 11/08/19   Kroeger, Ovidio Kin., PA-C  olmesartan (BENICAR) 40 MG tablet Take 1 tablet (40 mg total) by mouth daily. 08/26/22   Joylene Grapes, NP  tamsulosin (FLOMAX) 0.4 MG CAPS capsule Take 2 capsules (0.8 mg total) by mouth daily after supper. 08/07/22 08/07/23  Sondra Come, MD  ticagrelor (BRILINTA) 90 MG TABS tablet Take 1 tablet (90 mg total) by mouth 2 (two) times daily. 07/10/22   Lennette Bihari, MD    Physical Exam: Vitals:   12/09/22 1808 12/09/22 1945 12/09/22 2100 12/09/22 2130  BP:  (!) 157/93 (!) 153/98 (!) 159/92  Pulse:  91 66 77  Resp:   16 17 16   Temp:  98.5 F (36.9 C)    TempSrc:  Oral    SpO2:  97% 100% 98%  Weight: 86.2 kg     Height: 5\' 6"  (1.676 m)       Constitutional: Appears calm, comfortable, not in any distress. Vitals:   12/09/22 1808 12/09/22 1945 12/09/22 2100 12/09/22 2130  BP:  (!) 157/93 (!) 153/98 (!) 159/92  Pulse:  91 66 77  Resp:  16 17 16   Temp:  98.5 F (36.9 C)    TempSrc:  Oral    SpO2:  97% 100% 98%  Weight: 86.2 kg     Height: 5\' 6"  (1.676 m)      Eyes: PERRL,  lids and conjunctivae normal ENMT: Mucous membranes are moist. Posterior pharynx clear of any exudate or lesions. Neck: normal, supple, no masses, no thyromegaly Respiratory: Clear to auscultation bilaterally, no wheezing, no crackles. Normal respiratory effort. No accessory muscle use.  Cardiovascular: S1-S2 heard, regular rate and rhythm, no murmurs / rubs / gallops.  Abdomen: Soft, no tenderness, no masses palpated. No hepatosplenomegaly. Bowel sounds positive.  Musculoskeletal: Right wrist, mildly erythematous, swollen, no puncture wounds noted.  Good ROM, no contractures. Normal muscle tone.  Skin: no rashes, lesions, ulcers. No induration Neurologic: CN 2-12 grossly intact. Sensation intact, DTR normal. Strength 5/5 in all 4.  Psychiatric: Normal judgment and insight. Alert and oriented x 3. Normal mood.    Labs on Admission: I have personally reviewed following labs and imaging studies  CBC: Recent Labs  Lab 12/09/22 1815  WBC 6.7  NEUTROABS 4.3  HGB 14.0  HCT 41.5  MCV 87.0  PLT 243   Basic Metabolic Panel: Recent Labs  Lab 12/09/22 1815  NA 140  K 3.3*  CL 108  CO2 23  GLUCOSE 146*  BUN 12  CREATININE 1.03  CALCIUM 9.1   GFR: Estimated Creatinine Clearance: 91.3 mL/min (by C-G formula based on SCr of 1.03 mg/dL). Liver Function Tests: Recent Labs  Lab 12/09/22 1815  AST 29  ALT 21  ALKPHOS 97  BILITOT 0.6  PROT 7.6  ALBUMIN 4.0   No results for input(s): "LIPASE", "AMYLASE" in the  last 168 hours. No results for input(s): "AMMONIA" in the last 168 hours. Coagulation Profile: Recent Labs  Lab 12/09/22 1815  INR 1.1   Cardiac Enzymes: No results for input(s): "CKTOTAL", "CKMB", "CKMBINDEX", "TROPONINI" in the last 168 hours. BNP (last 3 results) No results for input(s): "PROBNP" in the last 8760 hours. HbA1C: No results for input(s): "HGBA1C" in the last 72 hours. CBG: No results for input(s): "GLUCAP" in the last 168 hours. Lipid Profile: No results for input(s): "CHOL", "HDL", "LDLCALC", "TRIG", "CHOLHDL", "LDLDIRECT" in the last 72 hours. Thyroid Function Tests: No results for input(s): "TSH", "T4TOTAL", "FREET4", "T3FREE", "THYROIDAB" in the last 72 hours. Anemia Panel: No results for input(s): "VITAMINB12", "FOLATE", "FERRITIN", "TIBC", "IRON", "RETICCTPCT" in the last 72 hours. Urine analysis:    Component Value Date/Time   COLORURINE YELLOW 12/28/2021 1051   APPEARANCEUR CLEAR 12/28/2021 1051   APPEARANCEUR Clear 02/28/2021 1532   LABSPEC 1.010 12/28/2021 1051   PHURINE 6.0 12/28/2021 1051   GLUCOSEU NEGATIVE 12/28/2021 1051   HGBUR NEGATIVE 12/28/2021 1051   BILIRUBINUR NEGATIVE 12/28/2021 1051   BILIRUBINUR negative 04/30/2021 1431   BILIRUBINUR Negative 02/28/2021 1532   KETONESUR NEGATIVE 12/28/2021 1051   PROTEINUR NEGATIVE 12/28/2021 1051   UROBILINOGEN 0.2 04/30/2021 1431   NITRITE NEGATIVE 12/28/2021 1051   LEUKOCYTESUR NEGATIVE 12/28/2021 1051    Radiological Exams on Admission: No results found.  EKG: EKG ordered please review.  Assessment/Plan Principal Problem:   Snake bite, initial encounter Active Problems:   Chronic constipation   Essential hypertension   HLD (hyperlipidemia)   Coronary artery disease   ADD (attention deficit disorder)   Chronic pain syndrome   Anxiety  Snake bite, initial encounter: Patient presented with presumed snakebite. Patient reports bite marks, pain, erythema ,  paresthesia, headache,  lightheadedness. Patient was given CroFab, Tdap.. Continue adequate pain control.  Continue IV fluids. Monitor overnight for observation.  Essential hypertension: Patient reports he only takes losartan. He does not take Coreg and amlodipine. Continue losartan.  Hyperlipidemia: Continue Lipitor 80 mg  dialy  Coronary artery disease: Patient takes Lipitor, Brilinta.   He reports stent 3 years back. Hold Brilinta as patient has received CroFab.  ADHD:  Resume Adderall once medication reconciliation complete  Chronic pain syndrome: Resume Subutex once dose is confirmed  Hypokalemia: Replaced.  Continue to monitor  DVT prophylaxis: Lovenox Code Status: Full code Family Communication: No family at bed side. Disposition Plan:     Status is: Observation The patient remains OBS appropriate and will d/c before 2 midnights.  Patient admitted for snakebite to the right wrist, was given CroFab.  Overnight observation.   Consults called: None Admission status: Observation   Willeen Niece MD [facility]   If 7PM-7AM, please contact night-coverage   12/09/2022, 10:11 PM

## 2022-12-09 NOTE — ED Notes (Signed)
Patient without symptoms of allergic reaction from Crofab. Rate increased to 250 ml/hr.

## 2022-12-09 NOTE — ED Provider Triage Note (Signed)
Emergency Medicine Provider Triage Evaluation Note  CORINNE BAYLESS , a 47 y.o. male  was evaluated in triage.  Pt complains of snake bite to the right forearm x 2hrs ago. Snake was not seen. Pt reports he was cutting wood and felt 2 puncture  bites into right distal forearm following immediate swelling spreading proximally. Induration marked with marker @ 1837.  Physical Exam  BP (!) 161/101 (BP Location: Left Arm)   Pulse (!) 104   Temp 98.2 F (36.8 C) (Oral)   Resp 16   Ht 5\' 6"  (1.676 m)   Wt 86.2 kg   SpO2 95%   BMI 30.67 kg/m  Gen:   Awake, no distress   Resp:  Normal effort  MSK:   Moves extremities without difficulty  Other:  R forearm reveal moderate swelling. Neurovascular status intact. Full AROM of all finger joints. Flexion and extension of wrist without difficulty. Mildly tender to palpation.  Medical Decision Making  Medically screening exam initiated at 6:09 PM.  Appropriate orders placed.  Inez Pilgrim was informed that the remainder of the evaluation will be completed by another provider, this initial triage assessment does not replace that evaluation, and the importance of remaining in the ED until their evaluation is complete.  Poison control called. Please see triage RN note    Kern Reap A, PA-C 12/09/22 [phone]

## 2022-12-09 NOTE — ED Notes (Signed)
First measurement drawn around swelling

## 2022-12-09 NOTE — ED Notes (Signed)
Post snake bite right arm measurements: Hand 22.5cm   Wrist 19.25cm     Forearm 29 cm    Bicep 34 cm.  Patient denies Axillary tenderness. Redness does not currently exceed boarder marked in triage. Cap refill < 3 seconds. Patient with good sensation, warmth and movement to fingers. Radial and ulnar pulse easily palpated. Arm elevated at this time.

## 2022-12-09 NOTE — ED Provider Notes (Signed)
[facility] Provider Note   Event Date/Time   First MD Initiated Contact with Patient 12/09/22 1927     (approximate) History  No chief complaint on file.  HPI Stephen Avila is a 47 y.o. male with a past medical history of hyperlipidemia, hypothyroidism, and pituitary insufficiency who presents complaining of a presumed snakebite to the right wrist.  Patient states that there were initially 2 small puncture wounds that seem to have closed over.  Patient states that since this bite he has had significant swelling and pain to this region with associated headache and intermittent palpitations.  Patient also states that he has paresthesias in this right hand however he does have a history of cervical spinal disease and has had paresthesias in the past ROS: Patient currently denies any vision changes, tinnitus, difficulty speaking, facial droop, sore throat, chest pain, shortness of breath, abdominal pain, nausea/vomiting/diarrhea, dysuria, or weakness/numbness in any extremity   Physical Exam  Triage Vital Signs: ED Triage Vitals  Enc Vitals Group     BP 12/09/22 1807 (!) 161/101     Pulse Rate 12/09/22 1807 (!) 104     Resp 12/09/22 1807 16     Temp 12/09/22 1807 98.2 F (36.8 C)     Temp Source 12/09/22 1807 Oral     SpO2 12/09/22 1807 95 %     Weight 12/09/22 1808 190 lb (86.2 kg)     Height 12/09/22 1808 5\' 6"  (1.676 m)     Head Circumference --      Peak Flow --      Pain Score 12/09/22 1808 0     Pain Loc --      Pain Edu? --      Excl. in GC? --    Most recent vital signs: Vitals:   12/09/22 2130 12/09/22 2225  BP: (!) 159/92 (!) 134/93  Pulse: 77 74  Resp: 16 14  Temp:  98.5 F (36.9 C)  SpO2: 98% 100%   General: Awake, oriented x4. CV:  Good peripheral perfusion.  Resp:  Normal effort.  Abd:  No distention.  Other:  Middle-aged obese Caucasian male laying in bed in no acute distress.  There is an area of 4 cm x 8 cm extending from the  wrist proximally on the dorsal aspect.  Erythema spreading beyond the marked region on assessment ED Results / Procedures / Treatments  Labs (all labs ordered are listed, but only abnormal results are displayed) Labs Reviewed  COMPREHENSIVE METABOLIC PANEL - Abnormal; Notable for the following components:      Result Value   Potassium 3.3 (*)    Glucose, Bld 146 (*)    All other components within normal limits  FIBRINOGEN  PROTIME-INR  CBC WITH DIFFERENTIAL/PLATELET  COMPREHENSIVE METABOLIC PANEL  CBC  MAGNESIUM  PHOSPHORUS  HIV ANTIBODY (ROUTINE TESTING W REFLEX)   PROCEDURES: Critical Care performed: No .1-3 Lead EKG Interpretation  Performed by: Merwyn Katos, MD Authorized by: Merwyn Katos, MD     Interpretation: normal     ECG rate:  71   ECG rate assessment: normal     Rhythm: sinus rhythm     Ectopy: none     Conduction: normal   CRITICAL CARE Performed by: Merwyn Katos  Total critical care time: 33 minutes  Critical care time was exclusive of separately billable procedures and treating other patients.  Critical care was necessary to treat or prevent imminent or life-threatening deterioration.  Critical care was time spent personally by me on the following activities: development of treatment plan with patient and/or surrogate as well as nursing, discussions with consultants, evaluation of patient's response to treatment, examination of patient, obtaining history from patient or surrogate, ordering and performing treatments and interventions, ordering and review of laboratory studies, ordering and review of radiographic studies, pulse oximetry and re-evaluation of patient's condition.  MEDICATIONS ORDERED IN ED: Medications  amLODipine (NORVASC) tablet 5 mg (has no administration in time range)  atorvastatin (LIPITOR) tablet 80 mg (has no administration in time range)  carvedilol (COREG) tablet 12.5 mg (has no administration in time range)  irbesartan  (AVAPRO) tablet 37.5 mg (has no administration in time range)  nitroGLYCERIN (NITROSTAT) SL tablet 0.4 mg (has no administration in time range)  tamsulosin (FLOMAX) capsule 0.8 mg (has no administration in time range)  ticagrelor (BRILINTA) tablet 90 mg (has no administration in time range)  enoxaparin (LOVENOX) injection 42.5 mg (has no administration in time range)  0.9 %  sodium chloride infusion (has no administration in time range)  acetaminophen (TYLENOL) tablet 650 mg (has no administration in time range)    Or  acetaminophen (TYLENOL) suppository 650 mg (has no administration in time range)  docusate sodium (COLACE) capsule 100 mg (has no administration in time range)  ondansetron (ZOFRAN) tablet 4 mg (has no administration in time range)    Or  ondansetron (ZOFRAN) injection 4 mg (has no administration in time range)  potassium chloride (KLOR-CON) packet 40 mEq (has no administration in time range)  crotalidae polyvalent immune fab (CROFAB) 4 vial in sodium chloride 0.9 % 250 mL infusion (4 vials Intravenous New Bag/Given 12/09/22 2104)  Tdap (BOOSTRIX) injection 0.5 mL (0.5 mLs Intramuscular Given 12/09/22 1950)   IMPRESSION / MDM / ASSESSMENT AND PLAN / ED COURSE  I reviewed the triage vital signs and the nursing notes.                             The patient is on the cardiac monitor to evaluate for evidence of arrhythmia and/or significant heart rate changes. Patient's presentation is most consistent with acute presentation with potential threat to life or bodily function.  This patient presents to the ED for concern of swelling to the right wrist and possible envenomation via snakebite, this involves an extensive number of treatment options, and is a complaint that carries with it a high risk of complications and morbidity.  The differential diagnosis includes snake bite, crotalid envenomation, arachnid envenomation, cellulitis Co morbidities that complicate the patient  evaluation  Chronic pain syndrome Additional history obtained:  Additional history obtained from patient and significant other at bedside  External records from outside source obtained and reviewed including recent office visit with urology on 09/25/2022 Lab Tests:  I Ordered, and personally interpreted labs.  The pertinent results include: Potassium 3.3 Cardiac Monitoring: / EKG:  The patient was maintained on a cardiac monitor.  I personally viewed and interpreted the cardiac monitored which showed an underlying rhythm of: Normal sinus rhythm Consultations Obtained:  I requested consultation with the hospitalist service,  and discussed lab and imaging findings as well as pertinent plan - they recommend: Admission Problem List / ED Course / Critical interventions / Medication management  Presumed envenomation (crotalid)  I ordered medication including CroFab and tetanus booster for bite wound  Reevaluation of the patient after these medicines showed that the patient improved  I have  reviewed the patients home medicines and have made adjustments as needed Dispo: Admit to medicine       FINAL CLINICAL IMPRESSION(S) / ED DIAGNOSES   Final diagnoses:  Snake envenomation, accidental or unintentional, initial encounter   Rx / DC Orders   ED Discharge Orders     None      Note:  This document was prepared using Dragon voice recognition software and may include unintentional dictation errors.   Merwyn Katos, MD 12/09/22 2230

## 2022-12-09 NOTE — ED Notes (Addendum)
Crofab initially started at 50 ml/hr

## 2022-12-10 DIAGNOSIS — W5911XA Bitten by nonvenomous snake, initial encounter: Secondary | ICD-10-CM | POA: Diagnosis not present

## 2022-12-10 DIAGNOSIS — F988 Other specified behavioral and emotional disorders with onset usually occurring in childhood and adolescence: Secondary | ICD-10-CM | POA: Diagnosis not present

## 2022-12-10 LAB — CBC
HCT: 37.8 % — ABNORMAL LOW (ref 39.0–52.0)
Hemoglobin: 12.4 g/dL — ABNORMAL LOW (ref 13.0–17.0)
MCH: 28.8 pg (ref 26.0–34.0)
MCHC: 32.8 g/dL (ref 30.0–36.0)
MCV: 87.9 fL (ref 80.0–100.0)
Platelets: 217 10*3/uL (ref 150–400)
RBC: 4.3 MIL/uL (ref 4.22–5.81)
RDW: 12.7 % (ref 11.5–15.5)
WBC: 5.5 10*3/uL (ref 4.0–10.5)
nRBC: 0 % (ref 0.0–0.2)

## 2022-12-10 LAB — COMPREHENSIVE METABOLIC PANEL
ALT: 17 U/L (ref 0–44)
AST: 18 U/L (ref 15–41)
Albumin: 3 g/dL — ABNORMAL LOW (ref 3.5–5.0)
Alkaline Phosphatase: 85 U/L (ref 38–126)
Anion gap: 7 (ref 5–15)
BUN: 13 mg/dL (ref 6–20)
CO2: 26 mmol/L (ref 22–32)
Calcium: 8.7 mg/dL — ABNORMAL LOW (ref 8.9–10.3)
Chloride: 107 mmol/L (ref 98–111)
Creatinine, Ser: 0.91 mg/dL (ref 0.61–1.24)
GFR, Estimated: >60 mL/min (ref >60)
Glucose, Bld: 106 mg/dL — ABNORMAL HIGH (ref 70–99)
Potassium: 3.7 mmol/L (ref 3.5–5.1)
Sodium: 140 mmol/L (ref 135–145)
Total Bilirubin: 0.5 mg/dL (ref 0.3–1.2)
Total Protein: 5.9 g/dL — ABNORMAL LOW (ref 6.5–8.1)

## 2022-12-10 LAB — MAGNESIUM: Magnesium: 2.2 mg/dL (ref 1.7–2.4)

## 2022-12-10 LAB — PHOSPHORUS: Phosphorus: 3.9 mg/dL (ref 2.5–4.6)

## 2022-12-10 MED ORDER — BUPRENORPHINE HCL 8 MG SL SUBL: 8.0000 mg | SUBLINGUAL_TABLET | Freq: Two times a day (BID) | SUBLINGUAL | Status: DC

## 2022-12-10 MED ORDER — DIPHENHYDRAMINE HCL 25 MG PO CAPS
25.0000 mg | ORAL_CAPSULE | Freq: Once | ORAL | Status: AC
  Administered 2022-12-10: 25 mg via ORAL
  Filled 2022-12-10: qty 1

## 2022-12-10 NOTE — Progress Notes (Addendum)
Hand 22.5  Wrist 19  Forearm 29.5  Bicep 32.5  No increased swelling noted. Will decrease frequency to q4h

## 2022-12-10 NOTE — ED Notes (Signed)
Measurements:  Hand: 23 Wrist: 19 Forearm: 31 Bicep: 32 Axillary tenderness: No

## 2022-12-10 NOTE — Discharge Summary (Signed)
Physician Discharge Summary   Patient: Stephen Avila MRN: [id number] DOB: [birth year]  Admit date:     12/09/2022  Discharge date: 12/10/22  Discharge Physician: Enedina Finner   PCP: Eustaquio Boyden, MD   Recommendations at discharge:    F/u PCP as needed Return to ER/Urgent care if s/s worsen  Discharge Diagnoses: Principal Problem:   Snake bite, initial encounter Active Problems:   Chronic constipation   Essential hypertension   HLD (hyperlipidemia)   Coronary artery disease   ADD (attention deficit disorder)   Chronic pain syndrome   Anxiety   Stephen Avila is a 47 y.o. male with PMH significant for hyperlipidemia, hypertension, pituitary insufficiency, history of CAD with a stent 3 years back, taking Brilinta daily, chronic pain syndrome on Subutex,  ADD taking Adderall presented in the ED with complaints of presumed snakebite to the right wrist.  Patient reports he was wearing gloves and he was putting wood log in the fire, he felt like something sting in his right wrist, has not seen any insect or snake bite   Presumed Snake bite, initial encounter: --Patient presented with presumed snakebite. --Patient reports bite marks, pain, erythema ,  paresthesia, headache, lightheadedness.--resolvedPatient was --given CroFab, Tdap.. -- Received IV fluids. --per RN--no increase in size of swelling by measurement. Vitals ok --pt advised keep hand elevated. Prn pain meds   Essential hypertension: --Patient reports he only takes losartan. --He does not take Coreg and amlodipine. --Continue losartan.   Hyperlipidemia: --Continue Lipitor 80 mg dialy   Coronary artery disease: --Patient takes Lipitor, Brilinta.   --He reports stent 3 years back. --Hold Brilinta for 48 hours as patient has received CroFab.   ADHD:  --on Adderall    Chronic pain syndrome: --Resume Subutex once dose is confirmed   Hypokalemia: Replaced.     DVT prophylaxis: Lovenox Code Status: Full  code Family Communication: wife at bed side.  Overall stable d/c home pt and wife agreeable     Pain control - [place] Controlled Substance Reporting System database was reviewed. and patient was instructed, not to drive, operate heavy machinery, perform activities at heights, swimming or participation in water activities or provide baby-sitting services while on Pain, Sleep and Anxiety Medications; until their outpatient Physician has advised to do so again. Also recommended to not to take more than prescribed Pain, Sleep and Anxiety Medications.   Discharge Diet Orders (From admission, onward)     Start     Ordered   12/10/22 0000  Diet - low sodium heart healthy        12/10/22 0853            DISCHARGE MEDICATION: Allergies as of 12/10/2022       Reactions   Erythromycin    Made chest feel "funny"        Medication List     STOP taking these medications    amLODipine 5 MG tablet Commonly known as: NORVASC   mirabegron ER 50 MG Tb24 tablet Commonly known as: MYRBETRIQ       TAKE these medications    amphetamine-dextroamphetamine 10 MG tablet Commonly known as: ADDERALL Take 10 mg by mouth 2 (two) times daily with a meal.   atorvastatin 80 MG tablet Commonly known as: LIPITOR Take 1 tablet (80 mg total) by mouth daily.   buprenorphine 8 MG Subl SL tablet Commonly known as: SUBUTEX Place 20 mg under the tongue daily.   carvedilol 6.25 MG tablet Commonly known as: COREG  TAKE 2 TABLETS(12.5 MG) BY MOUTH TWICE DAILY WITH A MEAL   ibuprofen 600 MG tablet Commonly known as: ADVIL Take 600 mg by mouth every 6 (six) hours as needed.   nitroGLYCERIN 0.4 MG SL tablet Commonly known as: NITROSTAT Place 1 tablet (0.4 mg total) under the tongue every 5 (five) minutes x 3 doses as needed for chest pain.   olmesartan 40 MG tablet Commonly known as: BENICAR Take 1 tablet (40 mg total) by mouth daily.   tamsulosin 0.4 MG Caps capsule Commonly known  as: FLOMAX Take 2 capsules (0.8 mg total) by mouth daily after supper.   ticagrelor 90 MG Tabs tablet Commonly known as: Brilinta Take 1 tablet (90 mg total) by mouth 2 (two) times daily.        Follow-up Information     Eustaquio Boyden, MD Follow up.   Specialty: Family Medicine Contact information: [address] [place] [address] [phone]                Discharge Exam: Ceasar Mons Weights   12/09/22 1808  Weight: 86.2 kg   Alert and O x3 Right hand swelling mild. Puffy fingers. No bite marks or erythema. Able to squeeze well. No sensory deficit.  Resp clear to auscultation CV both S1 S2 normal no murmur  Condition at discharge: fair  The results of significant diagnostics from this hospitalization (including imaging, microbiology, ancillary and laboratory) are listed below for reference.   Imaging Studies: No results found.  Microbiology: Results for orders placed or performed in visit on 04/30/21  Urine Culture     Status: None   Collection Time: 04/30/21  2:56 PM   Specimen: Urine  Result Value Ref Range Status   MICRO NUMBER: [id number]  Final   SPECIMEN QUALITY: Adequate  Final   Sample Source NOT GIVEN  Final   STATUS: FINAL  Final   Result: No Growth  Final    Labs: CBC: Recent Labs  Lab 12/09/22 1815 12/10/22 0529  WBC 6.7 5.5  NEUTROABS 4.3  --   HGB 14.0 12.4*  HCT 41.5 37.8*  MCV 87.0 87.9  PLT 243 217   Basic Metabolic Panel: Recent Labs  Lab 12/09/22 1815 12/10/22 0529  NA 140 140  K 3.3* 3.7  CL 108 107  CO2 23 26  GLUCOSE 146* 106*  BUN 12 13  CREATININE 1.03 0.91  CALCIUM 9.1 8.7*  MG  --  2.2  PHOS  --  3.9   Liver Function Tests: Recent Labs  Lab 12/09/22 1815 12/10/22 0529  AST 29 18  ALT 21 17  ALKPHOS 97 85  BILITOT 0.6 0.5  PROT 7.6 5.9*  ALBUMIN 4.0 3.0*    Discharge time spent: greater than 30 minutes.  Signed: Enedina Finner, MD [facility] 12/10/2022

## 2022-12-10 NOTE — Plan of Care (Signed)
Post iv infusion 2.5 hours Hand 22.5 Wrist 19 Forearm 29.5   Bicep 33

## 2022-12-10 NOTE — Discharge Instructions (Addendum)
Keep right hand elevated. Use ice pack if needed Return to ER/Urgent care if symptoms worsen  RESUME Brilinta after 48 hours

## 2022-12-10 NOTE — Progress Notes (Signed)
1 hour post iv medication Hand 22.5cm Wrist 19 Forearm 29.5 Bicep 34

## 2022-12-11 LAB — HIV ANTIBODY (ROUTINE TESTING W REFLEX): HIV Screen 4th Generation wRfx: NONREACTIVE

## 2022-12-19 DIAGNOSIS — M542 Cervicalgia: Secondary | ICD-10-CM | POA: Diagnosis not present

## 2022-12-19 DIAGNOSIS — M5412 Radiculopathy, cervical region: Secondary | ICD-10-CM | POA: Diagnosis not present

## 2022-12-22 NOTE — Progress Notes (Deleted)
Cardiology Clinic Note   Date: 12/22/2022 ID: Stephen Avila, DOB Sep 06, 1975, MRN [id number]  Primary Cardiologist:  Nicki Guadalajara, MD  Patient Profile    Stephen Avila is a 47 y.o. male who presents to the clinic today for ***    Past medical history significant for: CAD. LHC 11/07/2019 (STEMI): Proximal RCA 70%.  OM1 70%.  OM2 100%.  PCI with DES to OM 2. Echo 11/07/2019: EF 60 to 65%.  Mild LVH.  Grade II DD.  Normal RV function.  Mild LAE.  Aortic valve sclerosis without stenosis. Nuclear stress test 02/20/2022: Normal low risk study. Hypertension.  Hyperlipidemia.*** Peptic ulcer disease. Hypogonadotropic hypogonadism. Prostate cancer. Anxiety. Hypothyroid. Pituitary insufficiency.     History of Present Illness    Stephen Avila was first evaluated by Dr. Tresa Endo on 11/07/2019 for STEMI.  Patient reported 2 hours prior to arrival in the ED he developed sudden onset of crushing substernal chest pain radiating to both arms and left side of jaw associated with diaphoresis.  Upon EMS arrival BP was 174/80, heart rate 80.  EKG showed inferolateral ST elevations.  Upon arrival to ED repeat EKG demonstrated increasing amplitude of ST elevations.  He was taken emergently to the Cath Lab where he underwent PCI with DES to OM 2.  He continues to be followed by Dr. Tresa Endo for the above outlined history.  He was last seen in the office by Bernadene Person, NP on 08/26/2022 for routine follow-up.  He was overall stable from cardiac standpoint.  He noted some mild dependent nonpitting bilateral ankle edema and swelling in his hands at times after increasing amlodipine to 10 mg daily.  Amlodipine was decreased to 5 mg and losartan was changed to olmesartan.  He was asked to keep a BP log and report BP consistently > 130/80.  Patient contacted the office on 11/18/2022 with complaints of continued lower extremity and hand edema.  Per conversation with triage: "Stephen Avila answers the phone.  She states it  has been addressed before with [name] in ankles and hands.  Changed meds but still continues with swelling.  He had an appt but was cancelled today by our office. States maybe a little better but looks the same. Swelling in hands and more predominant in right ankle. Pitting edema. No compression stockings. Non elevating during the day.  Sodium intake "could be better". No daily weights.  No SOB.  Girlfriend states it is worse, While he is away from the phone.  Advised to take BP log  in the morning before meds and 2 hours after. Elevate legs and hands during the day when possible.  Watch sodium intake.  Advised will let him know if any changes prior to appt." Per Bernadene Person, NP stop amlodipine and continue to monitor symptoms.  Patient had a recent ED visit on 12/09/2022 for possible snakebite on right wrist.  Patient was given CroFab and Tdap.  He was monitored overnight with serial arm measurements.  He did not have an increase in swelling and was discharged with recommendation to keep hand elevated.  Brilinta was held for 48 hours secondary to CroFab.  Today, patient ***  Lower extremity/hand edema.  Patient*** CAD.  S/p PCI with DES to OM 2.  Nuclear stress test August 2023 was a normal low risk study.  Patient*** Continue atorvastatin, carvedilol, Brilinta, as needed SL NTG. Hypertension. BP today *** Patient denies headaches, dizziness or vision changes. Continue carvedilol, olmesartan. Hyperlipidemia.***   ROS: All  other systems reviewed and are otherwise negative except as noted in History of Present Illness.  Studies Reviewed    @[email or web address] @ ECG personally reviewed by me today: ***  No significant changes from ***  Risk Assessment/Calculations    {Does this patient have ATRIAL FIBRILLATION?:[phone]} No BP recorded.  {Refresh Note OR Click here to enter BP  :1}***        Physical Exam    VS:  There were no vitals taken for this visit. , BMI There is no height or weight on file to  calculate BMI.  GEN: Well nourished, well developed, in no acute distress. Neck: No JVD or carotid bruits. Cardiac: *** RRR. No murmurs. No rubs or gallops.   Respiratory:  Respirations regular and unlabored. Clear to auscultation without rales, wheezing or rhonchi. GI: Soft, nontender, nondistended. Extremities: Radials/DP/PT 2+ and equal bilaterally. No clubbing or cyanosis. No edema ***  Skin: Warm and dry, no rash. Neuro: Strength intact.  Assessment & Plan   ***  Disposition: ***     {Are you ordering a CV Procedure (e.g. stress test, cath, DCCV, TEE, etc)?   Press F2        :[id number]}   Signed, Etta Grandchild. Denecia Brunette, DNP, NP-C

## 2022-12-23 ENCOUNTER — Ambulatory Visit: Payer: BC Managed Care – PPO | Admitting: Student

## 2023-01-27 ENCOUNTER — Other Ambulatory Visit: Payer: BC Managed Care – PPO

## 2023-01-27 DIAGNOSIS — Z125 Encounter for screening for malignant neoplasm of prostate: Secondary | ICD-10-CM

## 2023-01-27 NOTE — Addendum Note (Signed)
Addended by: Sueanne Margarita on: 01/27/2023 09:38 AM   Modules accepted: Orders

## 2023-01-28 ENCOUNTER — Other Ambulatory Visit: Payer: BC Managed Care – PPO | Admitting: Urology

## 2023-01-28 LAB — PSA: Prostate Specific Ag, Serum: 1 ng/mL (ref 0.0–4.0)

## 2023-01-29 ENCOUNTER — Encounter: Payer: Self-pay | Admitting: Family Medicine

## 2023-01-29 ENCOUNTER — Ambulatory Visit (INDEPENDENT_AMBULATORY_CARE_PROVIDER_SITE_OTHER): Payer: BC Managed Care – PPO | Admitting: Family Medicine

## 2023-01-29 VITALS — BP 132/82 | HR 83 | Temp 97.2°F | Ht 66.0 in | Wt 191.4 lb

## 2023-01-29 DIAGNOSIS — E230 Hypopituitarism: Secondary | ICD-10-CM | POA: Diagnosis not present

## 2023-01-29 DIAGNOSIS — R61 Generalized hyperhidrosis: Secondary | ICD-10-CM

## 2023-01-29 DIAGNOSIS — E291 Testicular hypofunction: Secondary | ICD-10-CM

## 2023-01-29 DIAGNOSIS — E038 Other specified hypothyroidism: Secondary | ICD-10-CM

## 2023-01-29 DIAGNOSIS — R739 Hyperglycemia, unspecified: Secondary | ICD-10-CM | POA: Diagnosis not present

## 2023-01-29 DIAGNOSIS — F411 Generalized anxiety disorder: Secondary | ICD-10-CM

## 2023-01-29 MED ORDER — BUSPIRONE HCL 10 MG PO TABS
10.0000 mg | ORAL_TABLET | Freq: Two times a day (BID) | ORAL | 3 refills | Status: DC
Start: 2023-01-29 — End: 2023-08-05

## 2023-01-29 NOTE — Patient Instructions (Addendum)
I think sweating may be hypothalamus issue. Labs today  We may refer you back to endocrinology in [place].  I'd like you to start buspar 10mg  nightly for the next week then if tolerated well may increase to twice daily.  Return in 2-3 months for physical.

## 2023-01-29 NOTE — Progress Notes (Signed)
Ph: [phone] Fax: [phone]   Patient ID: Stephen Avila, male    DOB: 09-21-1975, 47 y.o.   MRN: [id number]  This visit was conducted in person.  BP 132/82   Pulse 83   Temp (!) 97.2 F (36.2 C) (Temporal)   Ht 5\' 6"  (1.676 m)   Wt 191 lb 6 oz (86.8 kg)   SpO2 97%   BMI 30.89 kg/m    CC: discuss anxiety  Subjective:   HPI: Stephen Avila is a 47 y.o. male presenting on 01/29/2023 for Medical Management of Chronic Issues (Here for anxiety f/u. Pt accompanied by girlfriend, [name]. )   Last seen 05/2021. History of migraine, HTN, PUD, HLD, chronic back pain and CAD/STEMI 2015 s/p DES 2021. Continues atorvastatin, buprenorphine, carvedilol, olmesartan, flomax and brilinta. Also on Adderall through pain management in [place] (Dr Dorothyann Gibbs).   Prostate cancer - seeing urology Richardo Hanks), s/p brachytherapy 05/2022 in [place], did not receive ADT. On flomax for ongoing urinary symptoms (nocturia). Didn't feel myrbetriq was helpful  Continues seeing neurosurgeon and pain clinic for chronic back pain.   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 Has previously seen endo [facility] and [facility]), last eval was 10/2019 at [facility] - at that time thought possible hypothalamus issue. H/o remote head injury.  Felt better when on biweekly testosterone injections but had to stop this due to prostate cancer.  No vision changes or double vision. No skin changes or diarrhea. + fatigue  + chronic constipation + anxiety + heat intolerance + notes he's losing hair to front of scalp.  + occ frontal headaches.   Notes continued sweating episodes throughout body - soaks clothes when this happens. Latest episode was yesterday morning, can happen a few times a day. Very sensitive to heat, but can happen in winter as well. Previously while on prednisone and gabapentin - this didn't help. Cbg during these episodes was  normal, BP was ok.  He changes clothes up to 3 times during episode.  This is lifestyle limiting, has affected previous jobs.  Chronic issue for 20+ yrs. Feels it may be getting worse.  Sweating throughout body - axilla, hands, feet, back, front - large beads of sweat that reaccumulate quickly after wiping during episodes. GF confirms this.   Ongoing anxiety - previously saw [facility] Corrigan for counseling. Previously tried sertraline 75mg  and Cymbalta without benefit.      Relevant past medical, surgical, family and social history reviewed and updated as indicated. Interim medical history since our last visit reviewed. Allergies and medications reviewed and updated. Outpatient Medications Prior to Visit  Medication Sig Dispense Refill   amphetamine-dextroamphetamine (ADDERALL) 10 MG tablet Take 10 mg by mouth 2 (two) times daily with a meal.     atorvastatin (LIPITOR) 80 MG tablet Take 1 tablet (80 mg total) by mouth daily. 90 tablet 3   buprenorphine (SUBUTEX) 8 MG SUBL SL tablet Place 20 mg under the tongue daily.      carvedilol (COREG) 6.25 MG tablet TAKE 2 TABLETS(12.5 MG) BY MOUTH TWICE DAILY WITH A MEAL 360 tablet 3   ibuprofen (ADVIL) 600 MG tablet Take 600 mg by mouth every 6 (six) hours as needed.     nitroGLYCERIN (NITROSTAT) 0.4 MG SL tablet Place 1 tablet (0.4 mg total) under the tongue every 5 (five) minutes x 3 doses as needed for chest pain. 25 tablet 3   olmesartan (BENICAR) 40 MG tablet  Take 1 tablet (40 mg total) by mouth daily. 90 tablet 3   tamsulosin (FLOMAX) 0.4 MG CAPS capsule Take 2 capsules (0.8 mg total) by mouth daily after supper. 180 capsule 3   ticagrelor (BRILINTA) 90 MG TABS tablet Take 1 tablet (90 mg total) by mouth 2 (two) times daily. 180 tablet 3   regadenoson (LEXISCAN) injection SOLN 0.4 mg      technetium tetrofosmin (TC-MYOVIEW) injection 30.8 millicurie      No facility-administered medications prior to visit.     Per HPI unless  specifically indicated in ROS section below Review of Systems  Objective:  BP 132/82   Pulse 83   Temp (!) 97.2 F (36.2 C) (Temporal)   Ht 5\' 6"  (1.676 m)   Wt 191 lb 6 oz (86.8 kg)   SpO2 97%   BMI 30.89 kg/m   Wt Readings from Last 3 Encounters:  01/29/23 191 lb 6 oz (86.8 kg)  12/09/22 190 lb (86.2 kg)  09/25/22 195 lb (88.5 kg)      Physical Exam Vitals and nursing note reviewed.  Constitutional:      Appearance: Normal appearance. He is not ill-appearing.  HENT:     Head: Normocephalic and atraumatic.     Mouth/Throat:     Mouth: Mucous membranes are moist.     Pharynx: Oropharynx is clear. No oropharyngeal exudate or posterior oropharyngeal erythema.  Eyes:     Extraocular Movements: Extraocular movements intact.     Conjunctiva/sclera: Conjunctivae normal.     Pupils: Pupils are equal, round, and reactive to light.  Neck:     Thyroid: No thyroid mass or thyromegaly.  Cardiovascular:     Rate and Rhythm: Normal rate and regular rhythm.     Pulses: Normal pulses.     Heart sounds: Normal heart sounds. No murmur heard. Pulmonary:     Effort: Pulmonary effort is normal. No respiratory distress.     Breath sounds: Normal breath sounds. No wheezing, rhonchi or rales.  Musculoskeletal:     Cervical back: Normal range of motion and neck supple.     Right lower leg: No edema.     Left lower leg: No edema.  Skin:    General: Skin is warm and dry.     Findings: No rash.  Neurological:     Mental Status: He is alert.     Sensory: Sensation is intact.     Motor: Motor function is intact.     Coordination: Coordination is intact.     Gait: Gait is intact.     Comments:  CN 2-12 intact FTN intact EOMI  Psychiatric:        Mood and Affect: Mood normal.        Behavior: Behavior normal.       Results for orders placed or performed in visit on 01/29/23  TSH  Result Value Ref Range   TSH 0.235 (L) 0.450 - 4.500 uIU/mL  Comprehensive metabolic panel  Result  Value Ref Range   Glucose 95 70 - 99 mg/dL   BUN 12 6 - 24 mg/dL   Creatinine, Ser 0.86 0.76 - 1.27 mg/dL   eGFR 97 >57 QI/ONG/2.95   BUN/Creatinine Ratio 12 9 - 20   Sodium 142 134 - 144 mmol/L   Potassium 4.3 3.5 - 5.2 mmol/L   Chloride 104 96 - 106 mmol/L   CO2 23 20 - 29 mmol/L   Calcium 9.6 8.7 - 10.2 mg/dL   Total Protein 7.1  6.0 - 8.5 g/dL   Albumin 4.4 4.1 - 5.1 g/dL   Globulin, Total 2.7 1.5 - 4.5 g/dL   Bilirubin Total 0.3 0.0 - 1.2 mg/dL   Alkaline Phosphatase 104 44 - 121 IU/L   AST 17 0 - 40 IU/L   ALT 14 0 - 44 IU/L  Hemoglobin A1c  Result Value Ref Range   Hgb A1c MFr Bld 5.5 4.8 - 5.6 %   Est. average glucose Bld gHb Est-mCnc 111 mg/dL  CBC with Differential/Platelet  Result Value Ref Range   WBC 6.0 3.4 - 10.8 x10E3/uL   RBC 4.92 4.14 - 5.80 x10E6/uL   Hemoglobin 14.7 13.0 - 17.7 g/dL   Hematocrit 16.1 09.6 - 51.0 %   MCV 88 79 - 97 fL   MCH 29.9 26.6 - 33.0 pg   MCHC 33.9 31.5 - 35.7 g/dL   RDW 04.5 40.9 - 81.1 %   Platelets 272 150 - 450 x10E3/uL   Neutrophils 60 Not Estab. %   Lymphs 28 Not Estab. %   Monocytes 7 Not Estab. %   Eos 4 Not Estab. %   Basos 1 Not Estab. %   Neutrophils Absolute 3.6 1.4 - 7.0 x10E3/uL   Lymphocytes Absolute 1.7 0.7 - 3.1 x10E3/uL   Monocytes Absolute 0.4 0.1 - 0.9 x10E3/uL   EOS (ABSOLUTE) 0.2 0.0 - 0.4 x10E3/uL   Basophils Absolute 0.0 0.0 - 0.2 x10E3/uL   Immature Granulocytes 0 Not Estab. %   Immature Grans (Abs) 0.0 0.0 - 0.1 x10E3/uL  T3  Result Value Ref Range   T3, Total 112 71 - 180 ng/dL  T4, free  Result Value Ref Range   Free T4 1.12 0.82 - 1.77 ng/dL  Cortisol-am, blood  Result Value Ref Range   Cortisol - AM 13.1 6.2 - 19.4 ug/dL  Testosterone, Free, Total, SHBG  Result Value Ref Range   Testosterone 213 (L) 264 - 916 ng/dL   Testosterone, Free 2.6 (L) 6.8 - 21.5 pg/mL   Sex Hormone Binding 33.9 16.5 - 55.9 nmol/L    Lab Results  Component Value Date   PSA1 1.0 01/27/2023   PSA1 1.3  09/24/2022   PSA1 5.7 (H) 02/26/2021   Lab Results  Component Value Date   TSH 0.235 (L) 01/29/2023   T3TOTAL 112 01/29/2023   T4TOTAL 9.1 08/20/2013       01/29/2023    9:08 AM 08/16/2022    2:08 PM 06/04/2021   12:46 PM 11/27/2020    3:55 PM 12/17/2019    2:59 PM  Depression screen PHQ 2/9  Decreased Interest 1 0 1 0 0  Down, Depressed, Hopeless 2 0 1 0 0  PHQ - 2 Score 3 0 2 0 0  Altered sleeping 3 0 1 1   Tired, decreased energy 1 0 1 2   Change in appetite 0 0 0 1   Feeling bad or failure about yourself  0 0 1 0   Trouble concentrating 0 0 1 2   Moving slowly or fidgety/restless 0 0 1 0   Suicidal thoughts 0 0 0 0   PHQ-9 Score 7 0 7 6   Difficult doing work/chores Extremely dIfficult Not difficult at all          01/29/2023    9:09 AM 08/16/2022    2:08 PM 06/04/2021   12:47 PM 11/27/2020    3:55 PM  GAD 7 : Generalized Anxiety Score  Nervous, Anxious, on Edge 3 0 3  1  Control/stop worrying 3 0 2 1  Worry too much - different things 3 0 2 1  Trouble relaxing 2 0 2 1  Restless 3 0 1 1  Easily annoyed or irritable 3 0 2 1  Afraid - awful might happen 3 0 3 1  Total GAD 7 Score 20 0 15 7  Anxiety Difficulty Very difficult Not difficult at all     Assessment & Plan:   Problem List Items Addressed This Visit     Central hypothyroidism    Update labs. Refer to endocrinology in [place].       Relevant Orders   TSH (Completed)   Comprehensive metabolic panel (Completed)   Hemoglobin A1c (Completed)   CBC with Differential/Platelet (Completed)   T3 (Completed)   T4, free (Completed)   Cortisol-am, blood (Completed)   Testosterone, Free, Total, SHBG (Completed)   Ambulatory referral to Endocrinology   Hypogonadism, male    Avoiding testosterone treatment in prostate cancer.       Relevant Orders   Ambulatory referral to Endocrinology   Pituitary insufficiency (HCC)    Last endo eval was at [facility] 2021. Has previously been tested for pheochromocytoma.        Relevant Orders   TSH (Completed)   Comprehensive metabolic panel (Completed)   Hemoglobin A1c (Completed)   CBC with Differential/Platelet (Completed)   T3 (Completed)   T4, free (Completed)   Cortisol-am, blood (Completed)   Testosterone, Free, Total, SHBG (Completed)   Ambulatory referral to Endocrinology   Hyperhidrosis - Primary    Predominant concern, consistent with general hyperhidrosis, longstanding for 20+ yrs.  Will trial oxybutynin.  Consider trial oral glycopyrrolate or clonidine.  Refer to endo for further recommendations/evaluation.       Relevant Orders   Ambulatory referral to Endocrinology   GAD (generalized anxiety disorder)    High GAD7 score. Endorses ongoing difficulty with this, with poor response to SNRI and SSRI trials in the past. Has also previously had counseling.  Start buspar 10mg  nightly x 1 wk then increase to 10mg  bid. Reviewed side effects to watch for including but not limited to dizziness, drowsiness.      Relevant Medications   busPIRone (BUSPAR) 10 MG tablet     Meds ordered this encounter  Medications   busPIRone (BUSPAR) 10 MG tablet    Sig: Take 1 tablet (10 mg total) by mouth 2 (two) times daily. First week take one tablet nightly    Dispense:  60 tablet    Refill:  3   oxybutynin (DITROPAN-XL) 5 MG 24 hr tablet    Sig: Take 1 tablet (5 mg total) by mouth at bedtime.    Dispense:  30 tablet    Refill:  0    Orders Placed This Encounter  Procedures   TSH   Comprehensive metabolic panel   Hemoglobin A1c   CBC with Differential/Platelet   T3   T4, free   Cortisol-am, blood   Testosterone, Free, Total, SHBG   Ambulatory referral to Endocrinology    Referral Priority:   Routine    Referral Type:   Consultation    Referral Reason:   Specialty Services Required    Number of Visits Requested:   1    Patient Instructions  I think sweating may be hypothalamus issue. Labs today  We may refer you back to endocrinology in  [place].  I'd like you to start buspar 10mg  nightly for the next week then if tolerated well  may increase to twice daily.  Return in 2-3 months for physical.   Follow up plan: Return if symptoms worsen or fail to improve.  Eustaquio Boyden, MD

## 2023-01-30 ENCOUNTER — Other Ambulatory Visit: Payer: BC Managed Care – PPO | Admitting: Urology

## 2023-01-30 LAB — COMPREHENSIVE METABOLIC PANEL
ALT: 14 IU/L (ref 0–44)
Albumin: 4.4 g/dL (ref 4.1–5.1)
Alkaline Phosphatase: 104 IU/L (ref 44–121)
BUN/Creatinine Ratio: 12 (ref 9–20)
BUN: 12 mg/dL (ref 6–24)
Bilirubin Total: 0.3 mg/dL (ref 0.0–1.2)
CO2: 23 mmol/L (ref 20–29)
Calcium: 9.6 mg/dL (ref 8.7–10.2)
Chloride: 104 mmol/L (ref 96–106)
Globulin, Total: 2.7 g/dL (ref 1.5–4.5)
Glucose: 95 mg/dL (ref 70–99)
Potassium: 4.3 mmol/L (ref 3.5–5.2)
Sodium: 142 mmol/L (ref 134–144)
Total Protein: 7.1 g/dL (ref 6.0–8.5)
eGFR: 97 mL/min/{1.73_m2} (ref >59)

## 2023-01-30 LAB — CBC WITH DIFFERENTIAL/PLATELET
Basophils Absolute: 0 10*3/uL (ref 0.0–0.2)
Basos: 1 %
EOS (ABSOLUTE): 0.2 10*3/uL (ref 0.0–0.4)
Eos: 4 %
Hematocrit: 43.4 % (ref 37.5–51.0)
Hemoglobin: 14.7 g/dL (ref 13.0–17.7)
Immature Grans (Abs): 0 10*3/uL (ref 0.0–0.1)
Immature Granulocytes: 0 %
Lymphocytes Absolute: 1.7 10*3/uL (ref 0.7–3.1)
MCH: 29.9 pg (ref 26.6–33.0)
MCHC: 33.9 g/dL (ref 31.5–35.7)
MCV: 88 fL (ref 79–97)
Monocytes Absolute: 0.4 10*3/uL (ref 0.1–0.9)
Monocytes: 7 %
Neutrophils Absolute: 3.6 10*3/uL (ref 1.4–7.0)
Neutrophils: 60 %
Platelets: 272 10*3/uL (ref 150–450)
RDW: 13.5 % (ref 11.6–15.4)
WBC: 6 10*3/uL (ref 3.4–10.8)

## 2023-01-30 LAB — HEMOGLOBIN A1C
Est. average glucose Bld gHb Est-mCnc: 111 mg/dL
Hgb A1c MFr Bld: 5.5 % (ref 4.8–5.6)

## 2023-01-30 LAB — T4, FREE: Free T4: 1.12 ng/dL (ref 0.82–1.77)

## 2023-01-30 LAB — TESTOSTERONE, FREE, TOTAL, SHBG: Testosterone: 213 ng/dL — ABNORMAL LOW (ref 264–916)

## 2023-01-30 LAB — TSH: TSH: 0.235 u[IU]/mL — ABNORMAL LOW (ref 0.450–4.500)

## 2023-01-30 LAB — T3: T3, Total: 112 ng/dL (ref 71–180)

## 2023-01-31 ENCOUNTER — Encounter: Payer: Self-pay | Admitting: Urology

## 2023-02-03 ENCOUNTER — Other Ambulatory Visit: Payer: Self-pay | Admitting: Family Medicine

## 2023-02-03 DIAGNOSIS — R61 Generalized hyperhidrosis: Secondary | ICD-10-CM

## 2023-02-03 MED ORDER — OXYBUTYNIN CHLORIDE ER 5 MG PO TB24: 5.0000 mg | ORAL_TABLET | Freq: Every day | ORAL | 0 refills | Status: DC

## 2023-02-03 NOTE — Assessment & Plan Note (Signed)
Avoiding testosterone treatment in prostate cancer.

## 2023-02-03 NOTE — Assessment & Plan Note (Signed)
Update labs. Refer to endocrinology in [place].

## 2023-02-03 NOTE — Assessment & Plan Note (Addendum)
Last endo eval was at [facility] 2021. Has previously been tested for pheochromocytoma.

## 2023-02-03 NOTE — Assessment & Plan Note (Signed)
High GAD7 score. Endorses ongoing difficulty with this, with poor response to SNRI and SSRI trials in the past. Has also previously had counseling.  Start buspar 10mg  nightly x 1 wk then increase to 10mg  bid. Reviewed side effects to watch for including but not limited to dizziness, drowsiness.

## 2023-02-03 NOTE — Telephone Encounter (Signed)
Message from pt via pharmacy:  **Patient requests 90 days supply**   Oxybutynin Last filled:  02/03/23, #30 Last OV:  01/29/23, anxiety f/u; hyperhidrosis Next OV:  06/02/23, CPE

## 2023-02-03 NOTE — Assessment & Plan Note (Addendum)
Predominant concern, consistent with general hyperhidrosis, longstanding for 20+ yrs.  Will trial oxybutynin.  Consider trial oral glycopyrrolate or clonidine.  Refer to endo for further recommendations/evaluation.

## 2023-02-04 ENCOUNTER — Other Ambulatory Visit: Payer: Self-pay | Admitting: Cardiovascular Disease

## 2023-02-05 NOTE — Telephone Encounter (Signed)
ERx 

## 2023-02-13 DIAGNOSIS — M4802 Spinal stenosis, cervical region: Secondary | ICD-10-CM | POA: Diagnosis not present

## 2023-02-13 DIAGNOSIS — M5021 Other cervical disc displacement,  high cervical region: Secondary | ICD-10-CM | POA: Diagnosis not present

## 2023-02-13 DIAGNOSIS — M47812 Spondylosis without myelopathy or radiculopathy, cervical region: Secondary | ICD-10-CM | POA: Diagnosis not present

## 2023-02-13 DIAGNOSIS — M50322 Other cervical disc degeneration at C5-C6 level: Secondary | ICD-10-CM | POA: Diagnosis not present

## 2023-02-13 DIAGNOSIS — M5412 Radiculopathy, cervical region: Secondary | ICD-10-CM | POA: Diagnosis not present

## 2023-02-20 DIAGNOSIS — M5412 Radiculopathy, cervical region: Secondary | ICD-10-CM | POA: Diagnosis not present

## 2023-02-20 DIAGNOSIS — M542 Cervicalgia: Secondary | ICD-10-CM | POA: Diagnosis not present

## 2023-04-25 ENCOUNTER — Ambulatory Visit: Payer: BC Managed Care – PPO | Admitting: Family Medicine

## 2023-05-20 ENCOUNTER — Encounter: Payer: Self-pay | Admitting: Nurse Practitioner

## 2023-05-20 ENCOUNTER — Telehealth: Payer: Self-pay | Admitting: Nurse Practitioner

## 2023-05-20 ENCOUNTER — Ambulatory Visit (INDEPENDENT_AMBULATORY_CARE_PROVIDER_SITE_OTHER): Payer: BC Managed Care – PPO | Admitting: Nurse Practitioner

## 2023-05-20 VITALS — BP 124/86 | HR 68 | Temp 98.3°F | Ht 66.0 in | Wt 196.8 lb

## 2023-05-20 DIAGNOSIS — R300 Dysuria: Secondary | ICD-10-CM | POA: Diagnosis not present

## 2023-05-20 DIAGNOSIS — Z8546 Personal history of malignant neoplasm of prostate: Secondary | ICD-10-CM | POA: Diagnosis not present

## 2023-05-20 DIAGNOSIS — R8290 Unspecified abnormal findings in urine: Secondary | ICD-10-CM | POA: Diagnosis not present

## 2023-05-20 DIAGNOSIS — R39198 Other difficulties with micturition: Secondary | ICD-10-CM | POA: Insufficient documentation

## 2023-05-20 DIAGNOSIS — Z87438 Personal history of other diseases of male genital organs: Secondary | ICD-10-CM | POA: Insufficient documentation

## 2023-05-20 LAB — POC URINALSYSI DIPSTICK (AUTOMATED)
Bilirubin, UA: NEGATIVE
Blood, UA: NEGATIVE
Glucose, UA: NEGATIVE
Ketones, UA: NEGATIVE
Leukocytes, UA: NEGATIVE
Nitrite, UA: NEGATIVE
Protein, UA: POSITIVE — AB
Spec Grav, UA: >=1.03 — AB (ref 1.010–1.025)
Urobilinogen, UA: 0.2 U/dL
pH, UA: 5.5 (ref 5.0–8.0)

## 2023-05-20 MED ORDER — SULFAMETHOXAZOLE-TRIMETHOPRIM 800-160 MG PO TABS
1.0000 | ORAL_TABLET | Freq: Two times a day (BID) | ORAL | 0 refills | Status: DC
Start: 2023-05-20 — End: 2023-07-08

## 2023-05-20 NOTE — Assessment & Plan Note (Addendum)
History of the same treat today

## 2023-05-20 NOTE — Assessment & Plan Note (Signed)
Pending urine culture.

## 2023-05-20 NOTE — Progress Notes (Signed)
Acute Office Visit  Subjective:     Patient ID: Stephen Avila, male    DOB: 09/28/1975, 47 y.o.   MRN: [id number]  Chief Complaint  Patient presents with  . Dysuria    Pt complains of trouble urinating, urination frequency, burning with urination, and lower back pain. Started a couple weeks ago.   . Medication Refill    Gabapentin     Patient is in today for urinary symptoms with a history of HTN, CAD, constipation, HLD,   States that he has had problems since having prostate cancer. State that he has had an infection with the biposy. States that over the past few weeks it has been more bothersome. States that he has had dysuria and lower back pain. Does have a history of back pain and surgery, kidney stones.  Review of Systems  Constitutional:  Negative for chills and fever.  Gastrointestinal:  Negative for abdominal pain, nausea and vomiting.       BM every 2-3 days and the last one yesterday that was normal   Genitourinary:  Positive for dysuria. Negative for flank pain, frequency and hematuria.       Urinary flow  States that he does have trouble empting the bladder   Nocturia x once an hour  Musculoskeletal:  Positive for back pain.        Objective:    BP 124/86   Pulse 68   Temp 98.3 F (36.8 C) (Oral)   Ht 5\' 6"  (1.676 m)   Wt 196 lb 12.8 oz (89.3 kg)   SpO2 95%   BMI 31.76 kg/m  BP Readings from Last 3 Encounters:  05/20/23 124/86  01/29/23 132/82  12/10/22 (!) 146/78   Wt Readings from Last 3 Encounters:  05/20/23 196 lb 12.8 oz (89.3 kg)  01/29/23 191 lb 6 oz (86.8 kg)  12/09/22 190 lb (86.2 kg)   SpO2 Readings from Last 3 Encounters:  05/20/23 95%  01/29/23 97%  12/10/22 99%      Physical Exam Vitals and nursing note reviewed.  Constitutional:      Appearance: Normal appearance.  Cardiovascular:     Rate and Rhythm: Normal rate and regular rhythm.     Heart sounds: Normal heart sounds.  Pulmonary:     Effort: Pulmonary effort is  normal.     Breath sounds: Normal breath sounds.  Abdominal:     General: Bowel sounds are normal. There is no distension.     Palpations: There is no mass.     Tenderness: There is no abdominal tenderness. There is no right CVA tenderness or left CVA tenderness.     Hernia: No hernia is present.  Musculoskeletal:        General: Tenderness present.       Arms:  Neurological:     Mental Status: He is alert.    Results for orders placed or performed in visit on 05/20/23  POCT Urinalysis Dipstick (Automated)  Result Value Ref Range   Color, UA yellow    Clarity, UA clear    Glucose, UA Negative Negative   Bilirubin, UA neg    Ketones, UA neg    Spec Grav, UA >=1.030 (A) 1.010 - 1.025   Blood, UA neg    pH, UA 5.5 5.0 - 8.0   Protein, UA Positive (A) Negative   Urobilinogen, UA 0.2 0.2 or 1.0 E.U./dL   Nitrite, UA neg    Leukocytes, UA Negative Negative  Assessment & Plan:   Problem List Items Addressed This Visit       Other   Dysuria - Primary    UA abnormal.  History of prostatitis.  Will treat for same Bactrim DS 1 tab twice daily for 7 days.  Pending PSA also      Relevant Medications   sulfamethoxazole-trimethoprim (BACTRIM DS) 800-160 MG tablet   Other Relevant Orders   POCT Urinalysis Dipstick (Automated) (Completed)   PSA   Decreased urine stream    History of the same on tamsulosin 0.8 mg daily along with history of prostate cancer pending urine culture will treat for presumed prostatitis.      Relevant Orders   PSA   Abnormal urinalysis    Pending urine culture.      Relevant Orders   Urine Culture   History of prostate cancer    Is followed by urology had to have implanted seeds but no surgery or radiation      Relevant Orders   PSA   History of prostatitis    History of the same treat today      Relevant Medications   sulfamethoxazole-trimethoprim (BACTRIM DS) 800-160 MG tablet    Meds ordered this encounter  Medications  .  sulfamethoxazole-trimethoprim (BACTRIM DS) 800-160 MG tablet    Sig: Take 1 tablet by mouth 2 (two) times daily.    Dispense:  14 tablet    Refill:  0    Order Specific Question:   Supervising Provider    Answer:   TOWER, MARNE A [[id number]]    Return if symptoms worsen or fail to improve.  Audria Nine, NP

## 2023-05-20 NOTE — Telephone Encounter (Signed)
Patient was requesting a refill on gabapentin.  Looks like either neurology or neurosurgery refilled her told patient I would send request to you (PCP).

## 2023-05-20 NOTE — Patient Instructions (Signed)
Nice to see you today I will be in touch with the labs once I have them Follow up if you do not improve

## 2023-05-20 NOTE — Assessment & Plan Note (Signed)
Is followed by urology had to have implanted seeds but no surgery or radiation

## 2023-05-20 NOTE — Assessment & Plan Note (Signed)
History of the same on tamsulosin 0.8 mg daily along with history of prostate cancer pending urine culture will treat for presumed prostatitis.

## 2023-05-20 NOTE — Assessment & Plan Note (Signed)
UA abnormal.  History of prostatitis.  Will treat for same Bactrim DS 1 tab twice daily for 7 days.  Pending PSA also

## 2023-05-21 LAB — PSA: Prostate Specific Ag, Serum: 0.7 ng/mL (ref 0.0–4.0)

## 2023-05-21 MED ORDER — GABAPENTIN 300 MG PO CAPS: 300.0000 mg | ORAL_CAPSULE | Freq: Three times a day (TID) | ORAL | 0 refills | Status: DC

## 2023-05-21 NOTE — Telephone Encounter (Signed)
Plz notify 90d supply sent to pharmacy.

## 2023-05-21 NOTE — Addendum Note (Signed)
Addended by: Eustaquio Boyden on: 05/21/2023 07:34 AM   Modules accepted: Orders

## 2023-05-21 NOTE — Addendum Note (Signed)
Addended by: Eustaquio Boyden on: 05/21/2023 05:16 PM   Modules accepted: Orders

## 2023-05-21 NOTE — Telephone Encounter (Signed)
Patient's girlfriend contacted the office regarding this med refill. States they went to pick meds up at the pharmacy and they were given a 30 day supply, and pharmacy told them that patient's insurance would not cover a 30 day supply. Pharmacy said insurance would cover a 90 day supply, and told patient to call office and request for Dr. Reece Agar to re send this. My end shows that Dr. Reece Agar sent this in for a 90 day supply today? Unsure of what happened. Please advise

## 2023-05-21 NOTE — Telephone Encounter (Addendum)
I have refilled Will discuss at OV in 2 wks.   Will also forward to referral team for assistance with endo referral placed 01/2023. Looks like pt was approved to see Dr Roosevelt Locks but appt not made.

## 2023-05-21 NOTE — Telephone Encounter (Signed)
I called Stephen Avila and left him a message letting him know the ph# to call for [facility] is [phone], and they have been trying to reach him to get him in with Dr. Roosevelt Locks.

## 2023-05-22 LAB — URINE CULTURE: Organism ID, Bacteria: NO GROWTH

## 2023-05-22 NOTE — Telephone Encounter (Signed)
Spoke to pt

## 2023-06-02 ENCOUNTER — Encounter: Payer: BC Managed Care – PPO | Admitting: Family Medicine

## 2023-06-03 ENCOUNTER — Encounter: Payer: Self-pay | Admitting: Family Medicine

## 2023-07-03 ENCOUNTER — Ambulatory Visit: Payer: Self-pay | Admitting: Family Medicine

## 2023-07-03 NOTE — Telephone Encounter (Signed)
  Chief Complaint: hemorrhoids Symptoms: bleeding with bowel movement Frequency: 1 month, worsening over the last few days Pertinent Negatives: Patient denies significant bleeding, blood thinners, vomiting, dizziness, fever Disposition: [] ED /[] Urgent Care (no appt availability in office) / [] Appointment(In office/virtual)/ [x]  [facility] [facility]/ [] Home Care/ [] Refused Recommended Disposition /[] [place] [facility]/ []  Follow-up with PCP Additional Notes: Pt family member, Braulio Conte, called with pt present. She reports pt has had chronic constipation and hemorrhoids that are bleeding with bowel movements. Pt states that bright red blood is present on toilet paper when he wipes, and at times in toilet, less than half a teaspoon. Pt states he does not have regular bowel movements, has had approx 3 in the last 7 days. States that he occasionally takes stool softener or laxatives, but not regularly. Pt reports his stool "smells like infection". Denies NVD, blood thinners and states at times he has abdominal pain after eating but it is gas. Per protocol, pt to see provider within 3 days. Scheduled in office with provider for 07/04/23 @ 0730. Care advice reviewed, pt verbalized understanding. Alerting PCP for review.   Copied from CRM [id number]. Topic: Clinical - Red Word Triage >> Jul 03, 2023  1:19 PM Alona Bene A wrote: Red Word that prompted transfer to Nurse Triage: Bleeding Reason for Disposition  MILD rectal bleeding (more than just a few drops or streaks)  Answer Assessment - Initial Assessment Questions 1. APPEARANCE of BLOOD: "What color is it?" "Is it passed separately, on the surface of the stool, or mixed in with the stool?"      Bright red, on the toilet paper and in toilet. 2. AMOUNT: "How much blood was passed?"      1/2 tsp 3. FREQUENCY: "How many times has blood been passed with the stools?"      Began approx 1 month, last few days increased 4. ONSET: "When was the blood first  seen in the stools?" (Days or weeks)      1 month 5. DIARRHEA: "Is there also some diarrhea?" If Yes, ask: "How many diarrhea stools in the past 24 hours?"      Denies 6. CONSTIPATION: "Do you have constipation?" If Yes, ask: "How bad is it?"     Yes, chronic 7. RECURRENT SYMPTOMS: "Have you had blood in your stools before?" If Yes, ask: "When was the last time?" and "What happened that time?"      Yes, chronic 8. BLOOD THINNERS: "Do you take any blood thinners?" (e.g., Coumadin/warfarin, Pradaxa/dabigatran, aspirin)     Denies 9. OTHER SYMPTOMS: "Do you have any other symptoms?"  (e.g., abdomen pain, vomiting, dizziness, fever)     Abdominal pain after eating, reports it is gas.  Protocols used: Rectal Bleeding-A-AH

## 2023-07-04 ENCOUNTER — Ambulatory Visit: Payer: BC Managed Care – PPO | Admitting: Internal Medicine

## 2023-07-04 NOTE — Telephone Encounter (Signed)
Noted has appt for eval today.

## 2023-07-08 ENCOUNTER — Encounter: Payer: Self-pay | Admitting: Family

## 2023-07-08 ENCOUNTER — Ambulatory Visit (INDEPENDENT_AMBULATORY_CARE_PROVIDER_SITE_OTHER): Payer: BC Managed Care – PPO | Admitting: Family

## 2023-07-08 VITALS — BP 122/76 | HR 74 | Temp 98.7°F | Ht 66.0 in | Wt 197.0 lb

## 2023-07-08 DIAGNOSIS — R0981 Nasal congestion: Secondary | ICD-10-CM

## 2023-07-08 DIAGNOSIS — H60592 Other noninfective acute otitis externa, left ear: Secondary | ICD-10-CM | POA: Diagnosis not present

## 2023-07-08 DIAGNOSIS — Z8719 Personal history of other diseases of the digestive system: Secondary | ICD-10-CM | POA: Insufficient documentation

## 2023-07-08 DIAGNOSIS — R059 Cough, unspecified: Secondary | ICD-10-CM | POA: Diagnosis not present

## 2023-07-08 DIAGNOSIS — Z9189 Other specified personal risk factors, not elsewhere classified: Secondary | ICD-10-CM | POA: Insufficient documentation

## 2023-07-08 DIAGNOSIS — H6692 Otitis media, unspecified, left ear: Secondary | ICD-10-CM | POA: Insufficient documentation

## 2023-07-08 DIAGNOSIS — I2510 Atherosclerotic heart disease of native coronary artery without angina pectoris: Secondary | ICD-10-CM

## 2023-07-08 DIAGNOSIS — H6090 Unspecified otitis externa, unspecified ear: Secondary | ICD-10-CM | POA: Insufficient documentation

## 2023-07-08 DIAGNOSIS — I252 Old myocardial infarction: Secondary | ICD-10-CM

## 2023-07-08 LAB — POCT RAPID STREP A (OFFICE): Rapid Strep A Screen: NEGATIVE

## 2023-07-08 LAB — POC COVID19 BINAXNOW: SARS Coronavirus 2 Ag: NEGATIVE

## 2023-07-08 MED ORDER — AMOXICILLIN-POT CLAVULANATE 875-125 MG PO TABS: 1.0000 | ORAL_TABLET | Freq: Two times a day (BID) | ORAL | 0 refills | Status: DC

## 2023-07-08 MED ORDER — NEOMYCIN-POLYMYXIN-HC 1 % OT SOLN
3.0000 [drp] | Freq: Four times a day (QID) | OTIC | 0 refills | Status: AC
Start: 2023-07-08 — End: 2023-07-15

## 2023-07-08 NOTE — Progress Notes (Signed)
 Established Patient Office Visit  Subjective:   Patient ID: Stephen Avila, male    DOB: 04/06/1976  Age: 47 y.o. MRN: [id number]  CC:  Chief Complaint  Patient presents with   Acute Visit    L ear pain x2 weeks. Started having problems with sinus congestion this morning. Denies sore throat or fever.    HPI: Stephen Avila is a 47 y.o. male presenting on 07/08/2023 for Acute Visit (L ear pain x2 weeks. Started having problems with sinus congestion this morning. Denies sore throat or fever.)  A few weeks ago started with itching left ear and then has since progressed to ear pain on the outside, sore to the touch, was bright red yesterday. . Now he is feeling some tenderness in front of ear into the lower jaw. Nasal congestion started this am , been blowing nose often as well. The nasal congestion has just started this am. Hard for him to catch his breath, not coughing. No sore throat.  No fever.   No otc medications.  Tried a bit of peroxide without any relief.   CAD, grade 2 diastolic dysfunction: he was taking brillinta 90 mg twice daily however he stopped it over six months ago. He has not transitioned to aspirin  and or any other blood thinner at this time, and is overdue for f/u appt with cardiology of which he states he    ROS: Negative unless specifically indicated above in HPI.   Relevant past medical history reviewed and updated as indicated.   Allergies and medications reviewed and updated.   Current Outpatient Medications:    amoxicillin -clavulanate (AUGMENTIN ) 875-125 MG tablet, Take 1 tablet by mouth 2 (two) times daily., Disp: 20 tablet, Rfl: 0   amphetamine-dextroamphetamine (ADDERALL) 10 MG tablet, Take 10 mg by mouth 2 (two) times daily with a meal., Disp: , Rfl:    atorvastatin  (LIPITOR ) 80 MG tablet, Take 1 tablet (80 mg total) by mouth daily., Disp: 90 tablet, Rfl: 3   buprenorphine  (SUBUTEX ) 8 MG SUBL SL tablet, Place 20 mg under the tongue daily. , Disp: ,  Rfl:    busPIRone  (BUSPAR ) 10 MG tablet, Take 1 tablet (10 mg total) by mouth 2 (two) times daily. First week take one tablet nightly, Disp: 60 tablet, Rfl: 3   carvedilol  (COREG ) 6.25 MG tablet, TAKE 2 TABLETS(12.5 MG) BY MOUTH TWICE DAILY WITH A MEAL, Disp: 360 tablet, Rfl: 3   gabapentin  (NEURONTIN ) 300 MG capsule, Take 1 capsule (300 mg total) by mouth 3 (three) times daily., Disp: 270 capsule, Rfl: 0   ibuprofen (ADVIL) 600 MG tablet, Take 600 mg by mouth every 6 (six) hours as needed., Disp: , Rfl:    NEOMYCIN -POLYMYXIN-HYDROCORTISONE (CORTISPORIN) 1 % SOLN OTIC solution, Place 3 drops into the left ear 4 (four) times daily for 7 days., Disp: 4.2 mL, Rfl: 0   nitroGLYCERIN  (NITROSTAT ) 0.4 MG SL tablet, Place 1 tablet (0.4 mg total) under the tongue every 5 (five) minutes x 3 doses as needed for chest pain., Disp: 25 tablet, Rfl: 3   olmesartan  (BENICAR ) 40 MG tablet, Take 1 tablet (40 mg total) by mouth daily., Disp: 90 tablet, Rfl: 3   oxybutynin  (DITROPAN -XL) 5 MG 24 hr tablet, TAKE 1 TABLET(5 MG) BY MOUTH AT BEDTIME, Disp: 90 tablet, Rfl: 1   tamsulosin  (FLOMAX ) 0.4 MG CAPS capsule, Take 2 capsules (0.8 mg total) by mouth daily after supper., Disp: 180 capsule, Rfl: 3   ticagrelor  (BRILINTA ) 90 MG TABS tablet, Take  1 tablet (90 mg total) by mouth 2 (two) times daily., Disp: 180 tablet, Rfl: 3  Allergies  Allergen Reactions   Erythromycin      Made chest feel funny    Objective:   BP 122/76 (BP Location: Left Arm, Patient Position: Sitting)   Pulse 74   Temp 98.7 F (37.1 C) (Temporal)   Ht 5' 6 (1.676 m)   Wt 197 lb (89.4 kg)   SpO2 97%   BMI 31.80 kg/m    Physical Exam Vitals reviewed.  Constitutional:      General: He is not in acute distress.    Appearance: Normal appearance. He is obese. He is not ill-appearing, toxic-appearing or diaphoretic.  HENT:     Head: Normocephalic.     Left Ear: No decreased hearing noted. Tenderness present. A middle ear effusion is  present. There is no impacted cerumen. Tympanic membrane is erythematous and retracted.     Nose: Nose normal.     Mouth/Throat:     Mouth: Mucous membranes are moist.  Eyes:     Pupils: Pupils are equal, round, and reactive to light.  Cardiovascular:     Rate and Rhythm: Normal rate and regular rhythm.  Pulmonary:     Effort: Pulmonary effort is normal.     Breath sounds: Normal breath sounds. No wheezing.  Musculoskeletal:        General: Normal range of motion.     Cervical back: Normal range of motion.  Lymphadenopathy:     Head:     Left side of head: Preauricular adenopathy present.  Neurological:     General: No focal deficit present.     Mental Status: He is alert and oriented to person, place, and time. Mental status is at baseline.  Psychiatric:        Mood and Affect: Mood normal.        Behavior: Behavior normal.        Thought Content: Thought content normal.        Judgment: Judgment normal.     Assessment & Plan:  Cough, unspecified type -     POCT rapid strep A -     POC COVID-19 BinaxNow  Sinus congestion -     POCT rapid strep A -     POC COVID-19 BinaxNow  Left acute otitis media Assessment & Plan: Take antibiotic as prescribed. Increase oral fluids. Pt to f/u if sx worsen and or fail to improve in 2-3 days. rx augmentin  875/125 mg po bid x 10 days   Orders: -     Amoxicillin -Pot Clavulanate; Take 1 tablet by mouth 2 (two) times daily.  Dispense: 20 tablet; Refill: 0 -     Neomycin -Polymyxin-HC; Place 3 drops into the left ear 4 (four) times daily for 7 days.  Dispense: 4.2 mL; Refill: 0  Other noninfective acute otitis externa of left ear Assessment & Plan: Treating with otic solution Heat to site and tylenol  prn  Pt advised to:  Please monitor site for worsening signs/symptoms of infection to include: increasing redness, increasing tenderness, increase in size, and or pustulant drainage from site. If this is to occur please let me know  immediately.    Orders: -     Neomycin -Polymyxin-HC; Place 3 drops into the left ear 4 (four) times daily for 7 days.  Dispense: 4.2 mL; Refill: 0  Coronary artery disease involving native coronary artery of native heart without angina pectoris  History of ST elevation myocardial infarction (STEMI) Assessment &  Plan: Pt is not currently on any blood thinners and overdue for appt with cardiologist.  Cardiology is not aware pt is not on blood thinner.  Will reach out to PCP and cardiology to make them aware.        Follow up plan: Return for f/u PCP if no improvement in symptoms.  Ginger Patrick, FNP

## 2023-07-08 NOTE — Assessment & Plan Note (Signed)
Take antibiotic as prescribed. Increase oral fluids. Pt to f/u if sx worsen and or fail to improve in 2-3 days. rx augmentin 875/125 mg po bid x 10 days  

## 2023-07-08 NOTE — Assessment & Plan Note (Signed)
Pt is not currently on any blood thinners and overdue for appt with cardiologist.  Cardiology is not aware pt is not on blood thinner.  Will reach out to PCP and cardiology to make them aware.

## 2023-07-08 NOTE — Assessment & Plan Note (Signed)
 Treating with otic solution Heat to site and tylenol  prn  Pt advised to:  Please monitor site for worsening signs/symptoms of infection to include: increasing redness, increasing tenderness, increase in size, and or pustulant drainage from site. If this is to occur please let me know immediately.

## 2023-08-04 ENCOUNTER — Other Ambulatory Visit: Payer: Self-pay | Admitting: Cardiovascular Disease

## 2023-08-04 ENCOUNTER — Ambulatory Visit: Payer: BC Managed Care – PPO | Admitting: Family

## 2023-08-04 ENCOUNTER — Other Ambulatory Visit: Payer: Self-pay

## 2023-08-04 DIAGNOSIS — N401 Enlarged prostate with lower urinary tract symptoms: Secondary | ICD-10-CM

## 2023-08-04 DIAGNOSIS — N3281 Overactive bladder: Secondary | ICD-10-CM

## 2023-08-04 DIAGNOSIS — R399 Unspecified symptoms and signs involving the genitourinary system: Secondary | ICD-10-CM

## 2023-08-05 ENCOUNTER — Ambulatory Visit (INDEPENDENT_AMBULATORY_CARE_PROVIDER_SITE_OTHER): Payer: BC Managed Care – PPO | Admitting: Urology

## 2023-08-05 ENCOUNTER — Other Ambulatory Visit
Admission: RE | Admit: 2023-08-05 | Discharge: 2023-08-05 | Disposition: A | Discharge status: Home / Self Care | Payer: BC Managed Care – PPO | Attending: Urology | Admitting: Urology

## 2023-08-05 VITALS — BP 159/94 | HR 75 | Ht 66.0 in | Wt 202.6 lb

## 2023-08-05 DIAGNOSIS — N401 Enlarged prostate with lower urinary tract symptoms: Secondary | ICD-10-CM

## 2023-08-05 DIAGNOSIS — N3281 Overactive bladder: Secondary | ICD-10-CM

## 2023-08-05 DIAGNOSIS — R399 Unspecified symptoms and signs involving the genitourinary system: Secondary | ICD-10-CM | POA: Diagnosis not present

## 2023-08-05 DIAGNOSIS — C61 Malignant neoplasm of prostate: Secondary | ICD-10-CM

## 2023-08-05 LAB — URINALYSIS, COMPLETE (UACMP) WITH MICROSCOPIC
Bilirubin Urine: NEGATIVE
Glucose, UA: NEGATIVE mg/dL
Hgb urine dipstick: NEGATIVE
Ketones, ur: NEGATIVE mg/dL
Leukocytes,Ua: NEGATIVE
Nitrite: NEGATIVE
Protein, ur: NEGATIVE mg/dL
Specific Gravity, Urine: 1.02 (ref 1.005–1.030)
Squamous Epithelial / HPF: NONE SEEN /[HPF] (ref 0–5)
pH: 5.5 (ref 5.0–8.0)

## 2023-08-05 LAB — BLADDER SCAN AMB NON-IMAGING

## 2023-08-05 MED ORDER — TAMSULOSIN HCL 0.4 MG PO CAPS: 0.8000 mg | ORAL_CAPSULE | Freq: Every day | ORAL | 3 refills | Status: DC

## 2023-08-05 MED ORDER — NYSTATIN-TRIAMCINOLONE 100000-0.1 UNIT/GM-% EX OINT: 1.0000 | TOPICAL_OINTMENT | Freq: Two times a day (BID) | CUTANEOUS | 0 refills | Status: DC

## 2023-08-05 MED ORDER — TESTOSTERONE CYPIONATE 200 MG/ML IM SOLN: 100.0000 mg | INTRAMUSCULAR | 1 refills | Status: DC

## 2023-08-05 NOTE — Progress Notes (Signed)
   08/05/2023 4:18 PM   Stephen Avila September 08, 1975 [id number]  Reason for visit: Follow up prostate cancer, urinary symptoms, low testosterone  HPI: 48 year old male who was diagnosed with prostate cancer in November 2022 with an elevated PSA of 6.1, and favorable intermediate risk disease. He was seen by a urologist at [facility] in [place] to request HIFU, but this was not covered by insurance and he ultimately underwent brachytherapy in November 2023. He did not get any ADT. He was unhappy with his care there and would like to transfer his urologic care back to [place].  He was on testosterone for at least a few years prior to undergoing treatment for prostate cancer.  At our visit in March 2024 he had significant urinary complaints with frequency and nocturia, that has improved somewhat, but continues to have intermittent dysuria and nocturia 2-3 times overnight.  He remains on max dose Flomax.  I again discussed cystoscopy as consideration to evaluate for urethral stricture and he would like to defer at this time.  Behavioral strategies were discussed.  PSA has been low, most recently 0.7 which continues to decrease from initial PSA after his procedure of 1.3.  Testosterone remains low, checked by PCP in July 2024 and was low at 213.  His primary complaint today is fatigue and low energy related to low testosterone, and he strongly would like to get back on testosterone injections.  Risks and controversy discussed at length with his diagnosis of prostate cancer and treatment within the last year.  After discussing the risks and benefits he would like to resume testosterone treatment.  Finally, he reports some itching on the penile shaft, and on exam some subtle erythema that may represent balanitis and I recommended a trial of Mycolog cream.  -Trial of Mycolog cream for penile skin lesion -Resume testosterone injections, 100 mg every 7 days, risks  discussed extensively with his history of prostate cancer -RTC 6 weeks for testosterone level 3 days after injection, PSA, H/H -Consider cystoscopy in the future if no improvement in urinary symptoms    Sondra Come, MD  [facility] [address] [place], [place] [address] [phone]

## 2023-09-16 ENCOUNTER — Other Ambulatory Visit
Admission: RE | Admit: 2023-09-16 | Discharge: 2023-09-16 | Disposition: A | Discharge status: Home / Self Care | Attending: Urology | Admitting: Urology

## 2023-09-16 DIAGNOSIS — E291 Testicular hypofunction: Secondary | ICD-10-CM | POA: Diagnosis not present

## 2023-09-16 DIAGNOSIS — R399 Unspecified symptoms and signs involving the genitourinary system: Secondary | ICD-10-CM | POA: Diagnosis not present

## 2023-09-16 DIAGNOSIS — N401 Enlarged prostate with lower urinary tract symptoms: Secondary | ICD-10-CM | POA: Insufficient documentation

## 2023-09-16 DIAGNOSIS — N3281 Overactive bladder: Secondary | ICD-10-CM | POA: Insufficient documentation

## 2023-09-16 LAB — HEMOGLOBIN AND HEMATOCRIT, BLOOD
HCT: 48.1 % (ref 39.0–52.0)
Hemoglobin: 16.3 g/dL (ref 13.0–17.0)

## 2023-09-17 ENCOUNTER — Ambulatory Visit (INDEPENDENT_AMBULATORY_CARE_PROVIDER_SITE_OTHER): Payer: Self-pay | Admitting: Urology

## 2023-09-17 DIAGNOSIS — C61 Malignant neoplasm of prostate: Secondary | ICD-10-CM

## 2023-09-17 DIAGNOSIS — E291 Testicular hypofunction: Secondary | ICD-10-CM

## 2023-09-17 LAB — TESTOSTERONE: Testosterone: 891 ng/dL (ref 264–916)

## 2023-09-17 NOTE — Progress Notes (Signed)
 Virtual Visit via Telephone Note  I connected with Stephen Avila on 09/17/23 at  1:00 PM EDT by telephone and verified that I am speaking with the correct person using two identifiers.   Patient location: Home Provider location: [facility]   I discussed the limitations, risks, security and privacy concerns of performing an evaluation and management service by telephone and the availability of in person appointments. We discussed the impact of the COVID-19 pandemic on the healthcare system, and the importance of social distancing and reducing patient and provider exposure. I also discussed with the patient that there may be a patient responsible charge related to this service. The patient expressed understanding and agreed to proceed.  Reason for visit: Prostate cancer, low testosterone, urinary symptoms  History of Present Illness: 48 year old male diagnosed with prostate cancer November 2022 with an elevated PSA of 6.1, he was ultimately treated with brachytherapy in [facility] in November 2023, he did not get any ADT.  He transferred his care back to [facility] u[facility] in January 2024.  He has a long history of low testosterone and testosterone was stopped during treatment for prostate cancer.  Testosterone remains low at 213 and he continued to report fatigue and low energy, low libido and requested to resume testosterone injections.  Risks and controversy discussed at length with his diagnosis of prostate cancer and he opted to resume testosterone treatment.  We resumed testosterone injections 100 mg every 7 days in January 2025, recent testosterone improved to 891, normal H&H.  Last PSA was November 2024 normal at 0.7, continue to decrease.  He also is having some itching and a lesion on the penile shaft and this was treated with Mycolog cream that resolved his symptoms.  Finally, he has had at least a year of bothersome urinary symptoms with frequency and  nocturia, intermittent dysuria, nocturia 2-3 times per night.  He is on max dose Flomax.  I have previously offered cystoscopy to evaluate for urethral stricture but he deferred.  He feels his symptoms have improved somewhat over the last few months and would like to hold off on cystoscopy.  Follow Up: RTC lab visit 6 months for testosterone, H&H, PSA  yearly follow-up in person for above labs and symptom check       I discussed the assessment and treatment plan with the patient. The patient was provided an opportunity to ask questions and all were answered. The patient agreed with the plan and demonstrated an understanding of the instructions.   The patient was advised to call back or seek an in-person evaluation if the symptoms worsen or if the condition fails to improve as anticipated.  I provided 8 minutes of non-face-to-face time during this encounter.   Sondra Come, MD

## 2023-09-22 ENCOUNTER — Ambulatory Visit: Payer: Self-pay | Admitting: Family Medicine

## 2023-09-22 ENCOUNTER — Ambulatory Visit: Admitting: Family Medicine

## 2023-09-22 DIAGNOSIS — M25421 Effusion, right elbow: Secondary | ICD-10-CM | POA: Diagnosis not present

## 2023-09-22 DIAGNOSIS — M25521 Pain in right elbow: Secondary | ICD-10-CM | POA: Diagnosis not present

## 2023-09-22 DIAGNOSIS — M79631 Pain in right forearm: Secondary | ICD-10-CM | POA: Diagnosis not present

## 2023-09-22 NOTE — Telephone Encounter (Signed)
 Chief Complaint: Right forearm injury Symptoms: pain, mild swelling Frequency: 1 hour ago Pertinent Negatives: Patient denies breaks in skin Disposition: [] ED /[] Urgent Care (no appt availability in office) / [x] Appointment(In office/virtual)/ []  [facility]/ [] Home Care/ [] Refused Recommended Disposition /[] [facility]/ []  Follow-up with PCP Additional Notes: Patient called to make appt for evaluation of right forearm, stating he was moving a trailer one hour ago and using his forearm as leverage and felt a pop. Patient is now experiencing 8/10 pain and mild swelling. Patient states denies any break in skin or known break in bones. Patient took two ibuprofen. Patient appt made for today for evaluation.   Copied from CRM [id number]. Topic: Clinical - Red Word Triage >> Sep 22, 2023 10:29 AM Desma Mcgregor wrote: Red Word that prompted transfer to Nurse Triage: About a hour ago, the pt was working and on the right inside forearm, he felt something pop and not sure if his bone is broken or if its a muscle. It's very painful and wants to be seen today. Reason for Disposition  Can't move injured arm normally (bend or straighten completely)  Answer Assessment - Initial Assessment Questions 1. MECHANISM: "How did the injury happen?"     Moving a trailer 2. ONSET: "When did the injury happen?" (Minutes or hours ago)      One hour ago 3. LOCATION: "Where is the injury located?" "Which arm?"     Right forearm 4. APPEARANCE of INJURY: "What does the injury look like?"      Mild swelling, no bruising 5. SEVERITY: "Can you use the arm normally?"      When bend wrist inward, causes pain up to forearm 6. SWELLING or BRUISING: "is there any swelling or bruising?" If Yes, ask: "How large is it? (e.g., inches, centimeters)      Mild swelling 7. PAIN: "Is there pain?" If Yes, ask: "How bad is the pain?"    (Scale 1-10; or mild, moderate, severe)   - NONE (0): No pain.   - MILD (1-3):  Doesn't interfere with normal activities.   - MODERATE (4-7): Interferes with normal activities (e.g., work or school) or awakens from sleep.   - SEVERE (8-10): Excruciating pain, unable to do any normal activities, unable to hold a cup of water.     8 8. TETANUS: For any breaks in the skin, ask: "When was the last tetanus booster?"     No breaks in skin 9. OTHER SYMPTOMS: "Do you have any other symptoms?"  (e.g., numbness in hand)     No new symptoms  Protocols used: Arm Injury-A-AH

## 2023-09-22 NOTE — Telephone Encounter (Signed)
 Noted.

## 2023-09-24 DIAGNOSIS — S46211A Strain of muscle, fascia and tendon of other parts of biceps, right arm, initial encounter: Secondary | ICD-10-CM | POA: Diagnosis not present

## 2023-09-24 DIAGNOSIS — M25521 Pain in right elbow: Secondary | ICD-10-CM | POA: Diagnosis not present

## 2023-09-24 DIAGNOSIS — M25421 Effusion, right elbow: Secondary | ICD-10-CM | POA: Diagnosis not present

## 2023-09-24 DIAGNOSIS — M79631 Pain in right forearm: Secondary | ICD-10-CM | POA: Diagnosis not present

## 2023-09-27 DIAGNOSIS — M25521 Pain in right elbow: Secondary | ICD-10-CM | POA: Diagnosis not present

## 2023-09-30 ENCOUNTER — Ambulatory Visit (INDEPENDENT_AMBULATORY_CARE_PROVIDER_SITE_OTHER): Payer: BC Managed Care – PPO | Admitting: Endocrinology

## 2023-09-30 ENCOUNTER — Encounter: Payer: Self-pay | Admitting: Endocrinology

## 2023-09-30 VITALS — BP 136/82 | HR 80 | Resp 20 | Ht 66.0 in | Wt 199.0 lb

## 2023-09-30 DIAGNOSIS — R232 Flushing: Secondary | ICD-10-CM

## 2023-09-30 DIAGNOSIS — E0590 Thyrotoxicosis, unspecified without thyrotoxic crisis or storm: Secondary | ICD-10-CM

## 2023-09-30 DIAGNOSIS — R61 Generalized hyperhidrosis: Secondary | ICD-10-CM

## 2023-09-30 NOTE — Patient Instructions (Addendum)
 Labs blood and 24 hr urine.   Complete lab in the early morning fasting.    24-hr Urine Collection: - Preferably to be done while staying at home and start in the morning.  - Discard the first urine of the morning after you wake up because that was the urine from overnight. DO NOT COLLECT FIRST URINE. Then begin 24-hour urine collection.  Collect your urine every time you pee for next 24 hours which means all day and overnight until next morning.  You collect the last urine next morning, that COMPLETES the collection.   Ultrasound thyroid plan.
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
## 2023-10-01 ENCOUNTER — Other Ambulatory Visit

## 2023-11-10 ENCOUNTER — Encounter: Payer: Self-pay | Admitting: Cardiovascular Disease

## 2023-11-10 ENCOUNTER — Ambulatory Visit: Payer: BC Managed Care – PPO | Attending: Cardiovascular Disease | Admitting: Cardiovascular Disease

## 2023-11-10 DIAGNOSIS — I25118 Atherosclerotic heart disease of native coronary artery with other forms of angina pectoris: Secondary | ICD-10-CM

## 2023-11-10 DIAGNOSIS — E291 Testicular hypofunction: Secondary | ICD-10-CM

## 2023-11-10 DIAGNOSIS — E785 Hyperlipidemia, unspecified: Secondary | ICD-10-CM

## 2023-11-10 DIAGNOSIS — Z8546 Personal history of malignant neoplasm of prostate: Secondary | ICD-10-CM

## 2023-11-10 DIAGNOSIS — I10 Essential (primary) hypertension: Secondary | ICD-10-CM | POA: Diagnosis not present

## 2023-11-10 DIAGNOSIS — I252 Old myocardial infarction: Secondary | ICD-10-CM

## 2023-11-10 MED ORDER — CARVEDILOL 12.5 MG PO TABS: 12.5000 mg | ORAL_TABLET | Freq: Two times a day (BID) | ORAL | 3 refills | Status: DC

## 2023-11-10 NOTE — Patient Instructions (Signed)
 Medication Instructions:  - START Carvedilol  12.5 MG TWICE DAILY    *If you need a refill on your cardiac medications before your next appointment, please call your pharmacy*   Lab Work: CMET  CBC LIPIDS  Lpa  TSH    If you have labs (blood work) drawn today and your tests are completely normal, you will receive your results only by: MyChart Message (if you have MyChart) OR A paper copy in the mail If you have any lab test that is abnormal or we need to change your treatment, we will call you to review the results.   Testing/Procedures:    Follow-Up: At [facility], you and your health needs are our priority.  As part of our continuing mission to provide you with exceptional heart care, we have created designated Provider Care Teams.  These Care Teams include your primary Cardiologist (physician) and Advanced Practice Providers (APPs -  Physician Assistants and Nurse Practitioners) who all work together to provide you with the care you need, when you need it.  We recommend signing up for the patient portal called "MyChart".  Sign up information is provided on this After Visit Summary.  MyChart is used to connect with patients for Virtual Visits (Telemedicine).  Patients are able to view lab/test results, encounter notes, upcoming appointments, etc.  Non-urgent messages can be sent to your provider as well.   To learn more about what you can do with MyChart, go to [email or web address].    Your next appointment:   6 -8 month(s)  The format for your next appointment:   In Person  Provider:   DAVID HARDING. MD    Other Instructions

## 2023-11-10 NOTE — Progress Notes (Signed)
 Cardiology Office Note    Date:  11/14/2023   ID:  SKYLEN Avila, DOB [birth year], MRN [id number]  PCP:  Claire Crick, MD  Cardiologist:  Magnus Schuller, MD   3-1/2-year F/U office visit   History of Present Illness:  Stephen Avila is a 48 y.o. male who I last saw on June 05, 2020 following his NSTEMI.  He presents for a 3-1/2-year follow-up office evaluation.  Stephen Avila has a history of hypertension, hyperlipidemia, peptic ulcer disease, and central hypopituitarism with significant sweating.  He was awakened from sleep the morning of Nov 07, 2019.  He was found to have 3 mm ST elevation inferolaterally upon arrival by EMS, a code STEMI was activated and he was brought to [facility] where I performed emergent cardiac catheterization.  He was found to have total occlusion of a large OM 2 vessel and a left dominant circulation.  He had concomitant CAD with 70% OM1 stenosis and 70% proximal RCA stenosis.  He underwent successful intervention to the totally occluded OM 2 vessel with ultimate and insertion of a 3.0 x 22 mm Resolute stent.  Subsequent echo Doppler study demonstrated complete salvage of myocardium with an EF of 60 to 65% without wall motion abnormalities.  He was seen for initial post hospital evaluation on Nov 17, 2019 by Tacy Expose, PA-C and was doing well.  I saw him for my initial office evaluation in February 28, 2020.  At that time he felt well and denied any  recurrent chest pain.  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  He admits to a long history of significant sweating and has undergone evaluations in the past.  He believes he is sleeping well.  Since his MI he has not had any episodes of chest tightness or shortness of breath.  He is unaware of palpitations.  In May LDL cholesterol was 104 upon presentation.  He has been on atorvastatin  80 mg ever since.  He was initially on aspirin  and Brilinta  but due to his GI bleed history aspirin  was discontinued in June and  he has been on Brilinta  90 mg twice a day ever since.  During that evaluation, his blood pressure was mildly elevated and I recommended titration of losartan  from 25 up to 50 mg daily.  He was also on amlodipine  5 mg daily and carvedilol  6.25 mg twice a day.  I last saw him on June 05, 2020.  He has a history of hypergonadism and has been on testosterone  injections every 2 weeks for at least 6 years.  2 weeks ago following an injection in his thigh and he noticed significant swelling at the injection site and some residual soreness which has persisted.  He ran out of his amlodipine  5 days ago.  He has noticed his heart rate has increased but denies any chest tightness or palpitations.  He has not had recent laboratory.  During that evaluation, he was tachycardic with an initial pulse of 120 which did slow down slightly during the evaluation.  I recommended he increase carvedilol  and renewed amlodipine .  He was seeing neurology for migraine headaches and it had been recommended a trial of Aimovig  injection monthly but this was denied by insurance.  I have not seen him since my November 2021 evaluation but he has seen Marlana Silvan, NP in January 2023 and most recently in February 2024.  Presently, he tells me that he was diagnosed with prostate cancer at age 54.  He is status  post back surgery.  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  He had experienced previous leg swelling and amlodipine  was discontinued with resolution of his edema.  He has been followed by Dr. Claire Crick for primary care.  Presently he is on carvedilol  12.5 mg twice a day, olmesartan  40 mg daily for hypertension.  He is on Brilinta  alone at 90 mg and no longer takes aspirin .  He is on atorvastatin  80 mg daily for hyperlipidemia and takes gabapentin  for neuropathy.  He has not had recent laboratory.  He presents for evaluation.  Past Medical History:  Diagnosis Date   Anxiety    Constipation    GERD (gastroesophageal reflux  disease)    Heart murmur    History of chicken pox    Hypertension    Kidney stones    MI (mitral incompetence)    Prostate cancer (HCC)    PUD (peptic ulcer disease)     Past Surgical History:  Procedure Laterality Date   CHOLECYSTECTOMY N/A 06/26/2015   Procedure: LAPAROSCOPIC CHOLECYSTECTOMY;  Surgeon: Alanda Allegra, MD;  Location: [facility];  Service: General;  Laterality: N/A;   COLONOSCOPY WITH PROPOFOL  N/A 05/07/2019   Procedure: COLONOSCOPY WITH PROPOFOL ;  Surgeon: Selena Daily, MD;  Location: [facility] ENDOSCOPY;  Service: Gastroenterology;  Laterality: N/A;   CORONARY/GRAFT ACUTE MI REVASCULARIZATION N/A 11/07/2019   Procedure: Coronary/Graft Acute MI Revascularization;  Surgeon: Millicent Ally, MD;  Location: [facility] INVASIVE CV LAB;  Service: Cardiovascular;  Laterality: N/A;   ESOPHAGOGASTRODUODENOSCOPY N/A 05/07/2019   Procedure: ESOPHAGOGASTRODUODENOSCOPY (EGD);  Surgeon: Selena Daily, MD;  Location: [facility] ENDOSCOPY;  Service: Gastroenterology;  Laterality: N/A;   HERNIA REPAIR  1979, 1981   x 2    LEFT HEART CATH AND CORONARY ANGIOGRAPHY N/A 11/07/2019   Procedure: LEFT HEART CATH AND CORONARY ANGIOGRAPHY;  Surgeon: Millicent Ally, MD;  Location: [facility] INVASIVE CV LAB;  Service: Cardiovascular;  Laterality: N/A;   LITHOTRIPSY     multiple    Current Medications: Outpatient Medications Prior to Visit  Medication Sig Dispense Refill   atorvastatin  (LIPITOR ) 80 MG tablet TAKE 1 TABLET(80 MG) BY MOUTH DAILY 90 tablet 1   buprenorphine  (SUBUTEX ) 8 MG SUBL SL tablet Place 20 mg under the tongue daily.      gabapentin  (NEURONTIN ) 300 MG capsule Take 1 capsule by mouth 3 (three) times daily.     ibuprofen (ADVIL) 600 MG tablet Take 600 mg by mouth every 6 (six) hours as needed.     nitroGLYCERIN  (NITROSTAT ) 0.4 MG SL tablet Place 1 tablet (0.4 mg total) under the tongue every 5 (five) minutes x 3 doses as needed for chest pain. 25 tablet 3   olmesartan  (BENICAR ) 40 MG tablet  Take 1 tablet (40 mg total) by mouth daily. 90 tablet 3   testosterone  cypionate (DEPOTESTOSTERONE CYPIONATE) 200 MG/ML injection Inject 0.5 mLs (100 mg total) into the muscle every 7 (seven) days. 10 mL 1   ticagrelor  (BRILINTA ) 90 MG TABS tablet Take 1 tablet (90 mg total) by mouth 2 (two) times daily. 180 tablet 3   carvedilol  (COREG ) 6.25 MG tablet TAKE 2 TABLETS(12.5 MG) BY MOUTH TWICE DAILY WITH A MEAL 360 tablet 1   No facility-administered medications prior to visit.     Allergies:   Erythromycin    Social History   Socioeconomic History   Marital status: Divorced    Spouse name: Not on file   Number of children: Not on file   Years of  education: Not on file   Highest education level: Not on file  Occupational History   Not on file  Tobacco Use   Smoking status: Never    Passive exposure: Never   Smokeless tobacco: Current    Types: Snuff    Last attempt to quit: 09/27/2020  Vaping Use   Vaping status: Never Used  Substance and Sexual Activity   Alcohol use: Yes    Comment: occasional   Drug use: No   Sexual activity: Yes    Partners: Female  Other Topics Concern   Not on file  Social History Narrative   Friend of Esau Heckle   Right handed   Lives alone, dog   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    Edu: HS   Social Drivers of Corporate investment banker Strain: Low Risk  (05/05/2019)   Overall Financial Resource Strain (CARDIA)    Difficulty of Paying Living Expenses: Not hard at all  Food Insecurity: No Food Insecurity (05/05/2019)   Hunger Vital Sign    Worried About Running Out of Food in the Last Year: Never true    Ran Out of Food in the Last Year: Never true  Transportation Needs: No Transportation Needs (05/05/2019)   PRAPARE - Administrator, Civil Service (Medical): No    Lack of Transportation (Non-Medical): No  Physical Activity: Sufficiently Active (05/05/2019)   Exercise Vital Sign    Days of Exercise per Week: 6 days    Minutes of Exercise  per Session: 150+ min  Stress: Stress Concern Present (05/05/2019)   [employer] of Occupational Health - Occupational Stress Questionnaire    Feeling of Stress : Very much  Social Connections: Moderately Isolated (05/05/2019)   Social Connection and Isolation Panel [NHANES]    Frequency of Communication with Friends and Family: More than three times a week    Frequency of Social Gatherings with Friends and Family: Once a week    Attends Religious Services: 1 to 4 times per year    Active Member of [employer] or Organizations: No    Attends Engineer, structural: Never    Marital Status: Divorced     Family History:  The patient's family history includes Arthritis in his mother; Asthma in his mother; COPD in his mother; Diabetes in his father; Early death (age of onset: 76) in his mother; Early death (age of onset: 48) in his father; Heart attack in his father and mother; Heart disease in his mother; Heart failure in his father; Learning disabilities in his father and mother; Lung cancer in his mother; Miscarriages / Stillbirths in his mother.   ROS General: Negative; No fevers, chills, or night sweats;  HEENT: Negative; No changes in vision or hearing, sinus congestion, difficulty swallowing Pulmonary: Negative; No cough, wheezing, shortness of breath, hemoptysis Cardiovascular: Negative; No chest pain, presyncope, syncope, palpitations GI: History of peptic ulcer disease GU: Negative; No dysuria, hematuria, or difficulty voiding Musculoskeletal: Negative; no myalgias, joint pain, or weakness Hematologic/Oncology: Negative; no easy bruising, bleeding Endocrine: Positive for significant sweating and history of possible central hypopituitarism Neuro: Negative; no changes in balance, headaches Skin: Negative; No rashes or skin lesions Psychiatric: Buprenorphine  dependency Sleep: Negative; No snoring, daytime sleepiness, hypersomnolence, bruxism, restless legs, hypnogognic  hallucinations, no cataplexy Other comprehensive 14 point system review is negative.   PHYSICAL EXAM:   VS:  BP 124/78   Pulse 75   Ht 5\' 6"  (1.676 m)   Wt 193 lb (87.5 kg)  SpO2 96%   BMI 31.15 kg/m     Repeat blood pressure by me was 124/76  Wt Readings from Last 3 Encounters:  11/10/23 193 lb (87.5 kg)  09/30/23 199 lb (90.3 kg)  08/05/23 202 lb 9.6 oz (91.9 kg)     General: Alert, oriented, no distress.  Skin: normal turgor, no rashes, warm and dry HEENT: Normocephalic, atraumatic. Pupils equal round and reactive to light; sclera anicteric; extraocular muscles intact;  Nose without nasal septal hypertrophy Mouth/Parynx benign; Mallinpatti scale 3 Neck: No JVD, no carotid bruits; normal carotid upstroke Lungs: clear to ausculatation and percussion; no wheezing or rales Chest wall: without tenderness to palpitation Heart: PMI not displaced, RRR, s1 s2 normal, 1/6 systolic murmur, no diastolic murmur, no rubs, gallops, thrills, or heaves Abdomen: soft, nontender; no hepatosplenomehaly, BS+; abdominal aorta nontender and not dilated by palpation. Back: no CVA tenderness Pulses 2+ Musculoskeletal: full range of motion, normal strength, no joint deformities Extremities: no clubbing cyanosis or edema, Homan's sign negative  Neurologic: grossly nonfocal; Cranial nerves grossly wnl Psychologic: Normal mood and affect    Studies/Labs Reviewed:   EKG Interpretation Date/Time:  Monday Nov 10 2023 09:34:22 EDT Ventricular Rate:  75 PR Interval:  152 QRS Duration:  106 QT Interval:  364 QTC Calculation: 406 R Axis:   -21  Text Interpretation: Normal sinus rhythm Normal ECG When compared with ECG of 28-Dec-2021 12:55, PREVIOUS ECG IS PRESENT Confirmed by Magnus Schuller ([id number]) on 11/14/2023 12:42:02 PM    June 05, 2020 ECG (independently read by me): Sinus tachycardia at 120, small inferior Q waves  February 28, 2020 ECG (independently read by me): NSR at 77, no ectopy,  normal intervals  Recent Labs:    Latest Ref Rng & Units 11/10/2023   10:53 AM 01/29/2023    9:20 AM 12/10/2022    5:29 AM  BMP  Glucose 70 - 99 mg/dL 99  95  098   BUN 6 - 24 mg/dL 12  12  13    Creatinine 0.76 - 1.27 mg/dL 1.19  1.47  8.29   BUN/Creat Ratio 9 - 20 12  12     Sodium 134 - 144 mmol/L 139  142  140   Potassium 3.5 - 5.2 mmol/L 4.2  4.3  3.7   Chloride 96 - 106 mmol/L 102  104  107   CO2 20 - 29 mmol/L 21  23  26    Calcium  8.7 - 10.2 mg/dL 9.2  9.6  8.7         Latest Ref Rng & Units 11/10/2023   10:53 AM 01/29/2023    9:20 AM 12/10/2022    5:29 AM  Hepatic Function  Total Protein 6.0 - 8.5 g/dL 7.0  7.1  5.9   Albumin 4.1 - 5.1 g/dL 4.1  4.4  3.0   AST 0 - 40 IU/L 22  17  18    ALT 0 - 44 IU/L 28  14  17    Alk Phosphatase 44 - 121 IU/L 122  104  85   Total Bilirubin 0.0 - 1.2 mg/dL 0.8  0.3  0.5        Latest Ref Rng & Units 11/10/2023   10:53 AM 09/16/2023   10:30 AM 01/29/2023    9:20 AM  CBC  WBC 3.4 - 10.8 x10E3/uL 5.5   6.0   Hemoglobin 13.0 - 17.7 g/dL 56.2  13.0  86.5   Hematocrit 37.5 - 51.0 % 45.5  48.1  43.4  Platelets 150 - 450 x10E3/uL 239   272    Lab Results  Component Value Date   MCV 86 11/10/2023   MCV 88 01/29/2023   MCV 87.9 12/10/2022   Lab Results  Component Value Date   TSH 0.327 (L) 11/10/2023   Lab Results  Component Value Date   HGBA1C 5.5 01/29/2023     BNP    Component Value Date/Time   BNP 18.6 11/07/2019 0635    ProBNP No results found for: "PROBNP"   Lipid Panel     Component Value Date/Time   CHOL 139 11/10/2023 1053   TRIG 82 11/10/2023 1053   HDL 35 (L) 11/10/2023 1053   CHOLHDL 4.0 11/10/2023 1053   CHOLHDL 4.5 11/07/2019 0304   VLDL 34 11/07/2019 0304   LDLCALC 88 11/10/2023 1053   LABVLDL 16 11/10/2023 1053     RADIOLOGY: No results found.  EMERGENT CATH/PCI: 11/07/2019  Additional studies/ records that were reviewed today include:   Prox RCA lesion is 70% stenosed. 1st Mrg lesion is 70%  stenosed. 2nd Mrg lesion is 100% stenosed.   Acute ST segment elevation myocardial infarction secondary to total occlusion of a large OM 2 vessel in a left dominant circulation.   The left main is very short and immediately bifurcates into a normal large LAD vessel without significant obstruction; circumflex vessel gives rise to a high marginal OM1 which has a ramus distribution and has diffuse 70% proximal narrowing, the OM 2 vessel is totally occluded with TIMI 0 flow and the distal circumflex supplies the inferolateral and posterior lateral wall; small nondominant RCA with diffuse 60-70% proximal to mid irregularity/stenosis.   Preserved global LV contractility with EF estimated at 55% with possible subtle focal inferior hypocontractility.   Successful PCI of the OM 2 vessel with PTCA and ultimate stenting with a 3.0 x 22 mm Resolute DES stent dilated to 3.1 mm with 100% occlusion being reduced to 0% and resumption of brisk TIMI-3 flow.   RECOMMENDATION:  DAPT for minimum of 12 months.  Initial adequate therapy for concomitant CAD of his large high marginal branch and small RCA vessel.  Initiate atorvastatin  80 mg for aggressive lipid-lowering therapy and attempt to induce plaque regression with target LDL less than 70 and preferably in the fifties or below.  Optimal blood pressure control.    Echocardiogram 11/07/19:  1. Normal LV systolic function; mild LVH; grade 2 diastolic dysfunction;  mild LAE.   2. Left ventricular ejection fraction, by estimation, is 60 to 65%. The  left ventricle has normal function. The left ventricle has no regional  wall motion abnormalities. There is mild left ventricular hypertrophy.  Left ventricular diastolic parameters  are consistent with Grade II diastolic dysfunction (pseudonormalization).   3. Right ventricular systolic function is normal. The right ventricular  size is normal.   4. Left atrial size was mildly dilated.   5. The mitral valve is normal  in structure. Trivial mitral valve  regurgitation. No evidence of mitral stenosis.   6. The aortic valve is tricuspid. Aortic valve regurgitation is not  visualized. Mild aortic valve sclerosis is present, with no evidence of  aortic valve stenosis.   7. The inferior vena cava is normal in size with greater than 50%  respiratory variability, suggesting right atrial pressure of 3 mmHg.     ASSESSMENT:    1. History of ST elevation myocardial infarction (STEMI): 11/07/2019 with DES stent to OM 2   2. Coronary artery disease of  native artery of native heart with stable angina pectoris (HCC)   3. Essential hypertension   4. Hyperlipidemia LDL goal <70   5. Hypogonadism, male   6. History of prostate cancer     PLAN:  Mr. Esequiel Schauer is a 48 year old gentleman who developed new onset chest pain the morning of Nov 07, 2019 and was found to have inferolateral ST segment elevation myocardial infarction secondary to total occlusion of a large OM 2 vessel of the dominant circumflex coronary artery. He underwent successful intervention with insertion of a Resolute 3.0 x 22 mm DES stent with the 100% occlusion being reduced to 0% and TIMI 0 flow improving to TIMI-3 flow. He had concomitant CAD involving his RCA and OM1 vessel.   A subsequent echo Doppler study has shown normalization of LV function with essentially complete salvage of myocardium.  He has been without recurrent anginal symptoms since his initial event.  Due to concomitant CAD and his history of hypertension he was on amlodipine  5 mg, carvedilol  6.25 mg twice a day, and losartan  25 mg daily.  When I saw him for my initial evaluation with me in August 2021 I further titrated losartan  to 50 mg daily for improved blood pressure control.  He has subsequently run out of amlodipine  for approximately 5 days.  At his last evaluation with me he was tachycardic and carvedilol  dose was further titrated and amlodipine  was renewed.  I have not seen him  since 2021.  Over the last several years he has undergone back surgery and has developed prostate cancer.  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  He remains asymptomatic with reference to chest pain or shortness of breath.  He remains active.  He had been taking off amlodipine  due to leg swelling with improvement.  He denies any chest pain or anginal symptomatology.  He sees Dr. Mariam Shingles for primary care.  His LDL cholesterol in 2022 was elevated at 124.  He has not had recent laboratory.  I am recommending fasting c-Met, CBC, TSH, lipid panel and will also check LP(a).  I have recommended carvedilol  12.5 mg twice a day and he continues to be on olmesartan  40 mg daily.  He is on Brilinta  90 mg twice a day without aspirin .  He is on atorvastatin  80 mg daily for hyperlipidemia and takes Neurontin  for neuropathy.  I will contact him regarding his laboratory and adjustments will be made to medical regimen if necessary.  I discussed with him my plans for upcoming retirement.  I will transition him to the cardiology care of Dr. Randene Bustard with plan follow-up in 6 to 8 months.   Medication Adjustments/Labs and Tests Ordered: Current medicines are reviewed at length with the patient today.  Concerns regarding medicines are outlined above.  Medication changes, Labs and Tests ordered today are listed in the Patient Instructions below. Patient Instructions  Medication Instructions:  - START Carvedilol  12.5 MG TWICE DAILY    *If you need a refill on your cardiac medications before your next appointment, please call your pharmacy*   Lab Work: CMET  CBC LIPIDS  Lpa  TSH    If you have labs (blood work) drawn today and your tests are completely normal, you will receive your results only by: MyChart Message (if you have MyChart) OR A paper copy in the mail If you have any lab test that is abnormal or we need to change your treatment, we will call you to review the  results.  Testing/Procedures:    Follow-Up: At [facility], you and your health needs are our priority.  As part of our continuing mission to provide you with exceptional heart care, we have created designated Provider Care Teams.  These Care Teams include your primary Cardiologist (physician) and Advanced Practice Providers (APPs -  Physician Assistants and Nurse Practitioners) who all work together to provide you with the care you need, when you need it.  We recommend signing up for the patient portal called "MyChart".  Sign up information is provided on this After Visit Summary.  MyChart is used to connect with patients for Virtual Visits (Telemedicine).  Patients are able to view lab/test results, encounter notes, upcoming appointments, etc.  Non-urgent messages can be sent to your provider as well.   To learn more about what you can do with MyChart, go to [email or web address].    Your next appointment:   6 -8 month(s)  The format for your next appointment:   In Person  Provider:   DAVID HARDING. MD    Other Instructions     Signed, Magnus Schuller, MD  11/14/2023 12:55 PM    [facility] [address], [place], [place]  [address] Phone: [phone]

## 2023-11-11 LAB — LIPID PANEL
Chol/HDL Ratio: 4 ratio (ref 0.0–5.0)
Cholesterol, Total: 139 mg/dL (ref 100–199)
HDL: 35 mg/dL — ABNORMAL LOW (ref >39)
LDL Chol Calc (NIH): 88 mg/dL (ref 0–99)
Triglycerides: 82 mg/dL (ref 0–149)
VLDL Cholesterol Cal: 16 mg/dL (ref 5–40)

## 2023-11-11 LAB — CBC
Hematocrit: 45.5 % (ref 37.5–51.0)
Hemoglobin: 15.5 g/dL (ref 13.0–17.7)
MCH: 29.3 pg (ref 26.6–33.0)
MCHC: 34.1 g/dL (ref 31.5–35.7)
MCV: 86 fL (ref 79–97)
Platelets: 239 10*3/uL (ref 150–450)
RBC: 5.29 x10E6/uL (ref 4.14–5.80)
RDW: 12.2 % (ref 11.6–15.4)
WBC: 5.5 10*3/uL (ref 3.4–10.8)

## 2023-11-11 LAB — COMPREHENSIVE METABOLIC PANEL WITH GFR
ALT: 28 IU/L (ref 0–44)
AST: 22 IU/L (ref 0–40)
Albumin: 4.1 g/dL (ref 4.1–5.1)
Alkaline Phosphatase: 122 IU/L — ABNORMAL HIGH (ref 44–121)
BUN/Creatinine Ratio: 12 (ref 9–20)
BUN: 12 mg/dL (ref 6–24)
Bilirubin Total: 0.8 mg/dL (ref 0.0–1.2)
CO2: 21 mmol/L (ref 20–29)
Calcium: 9.2 mg/dL (ref 8.7–10.2)
Chloride: 102 mmol/L (ref 96–106)
Creatinine, Ser: 0.99 mg/dL (ref 0.76–1.27)
Globulin, Total: 2.9 g/dL (ref 1.5–4.5)
Glucose: 99 mg/dL (ref 70–99)
Potassium: 4.2 mmol/L (ref 3.5–5.2)
Sodium: 139 mmol/L (ref 134–144)
Total Protein: 7 g/dL (ref 6.0–8.5)
eGFR: 94 mL/min/1.73

## 2023-11-11 LAB — LIPOPROTEIN A (LPA): Lipoprotein (a): <8.4 nmol/L

## 2023-11-11 LAB — TSH: TSH: 0.327 u[IU]/mL — ABNORMAL LOW (ref 0.450–4.500)

## 2023-11-14 ENCOUNTER — Encounter: Payer: Self-pay | Admitting: Cardiovascular Disease

## 2023-11-24 ENCOUNTER — Ambulatory Visit: Payer: Self-pay | Admitting: *Deleted

## 2023-11-24 NOTE — Telephone Encounter (Signed)
-----   Message from Stephen Avila sent at 11/14/2023 12:59 PM EDT ----- Chemistry normal with exception of minimal alkaline phosphatase elevation.  TSH is over suppressed at 0.327.  Recommend follow-up with primary MD, Dr. Mariam Shingles and may need endocrinology evaluation.  Lipid panel improved with LDL cholesterol at 88.  Total cholesterol 139.  However with prior history of MI would add Zetia 10 mg to his current atorvastatin  80 mg for more aggressive lipid lowering therapy.  LP(a) is normal at less than 8.4.  CBC stable

## 2023-11-24 NOTE — Telephone Encounter (Signed)
Pt returning call in regards to results. Please advise 

## 2024-01-12 ENCOUNTER — Ambulatory Visit: Payer: Self-pay | Admitting: *Deleted

## 2024-01-12 NOTE — Telephone Encounter (Signed)
 FYI Only or Action Required?: Action required by provider: requesting earlier appt, refusing UC or ED in meantime.   Called Nurse Triage reporting white tongue, Mouth Lesions, genital lesions, Sore Throat, Abdominal Pain, and skin peeling.  Symptoms began several days ago.  Interventions attempted: Prescription medications: leftover bactrim  from other prostate infection and Other: spoke to urologist who recommended UC.  Symptoms are: rapidly worsening.  Triage Disposition: See HCP Within 4 Hours (Or PCP Triage)  Patient/caregiver understands and will follow disposition?: No, wishes to speak with PCP       Reason for Disposition  [1] SEVERE mouth pain (e.g., excruciating) AND [2] not improved after 2 hours of pain medicine  Answer Assessment - Initial Assessment Questions Pt previously was scheduled for appt tomorrow morning since no availability with PCP office today, refusing UC in meantime, sending message to see if can be seen sooner, advised ED if severe or any worsening, refusing ED as well  1. ONSET: When did the mouth start hurting? (e.g., hours or days ago)      Couple days ago 2. SEVERITY: How bad is the pain? (Scale 1-10; mild, moderate or severe)   - MILD (1-3):  doesn't interfere with eating or normal activities   - MODERATE (4-7): interferes with eating some solids and normal activities   - SEVERE (8-10):  excruciating pain, interferes with most normal activities   - SEVERE DYSPHAGIA: can't swallow liquids, drooling     Not drooling or spitting out saliva Not eating because mouth so sore, very little drinking because that bothers it too, still urinating every 8 hours, no dizziness 3. SORES: Are there any sores or ulcers in the mouth? If Yes, ask: What part of the mouth are the sores in?     Water blisters and peeling in genital to penis, no discharge, weeping blisters, burns like fire, very painful, one actually open around head of penis Mouth and tongue and  throat around inner part of lower lip where gum and lip meet, started as one big blister and now little blisters, tongue very white, sides of tongue very painful, no swelling to tongue Throat feels sore, glands, feels like swelling Started bactrim  on Thursday for what thought was beginning prostate infection, had leftover antibx, pain in lower abdominal area, started the bactrim  that has somewhat helped what thought prostate infection, not examined or prescribed for this time Not as much pain in lower abdominal area Stopped bactrim  2 days ago after thrush started 4. FEVER: Do you have a fever? If Yes, ask: What is your temperature, how was it measured, and when did it start?     no 5. CAUSE: What do you think is causing the mouth pain?     Thinks thrush and allergic reaction to bactrim  6. OTHER SYMPTOMS: Do you have any other symptoms? (e.g., difficulty breathing)     No SOB No fever or swelling to face Called his urologist yesterday and advised UC, but prefers to see his regular doc No bloody crusts, just draining when there is a sore Burns Maybe had this on a smaller scale but never had this peeling before Where peeling skin happens to tear then red  Protocols used: Mouth Pain-A-AH

## 2024-01-12 NOTE — Telephone Encounter (Signed)
 Call disconnected before transfer- patient girlfriend calling- Emily Moser(DPR). Attempted to call her back- no answer- left message to call office. Attempted to contact patient- no answer- mailbox is full.   Copied from CRM [id number]. Topic: Clinical - Red Word Triage >> Jan 12, 2024 12:47 PM Suzen RAMAN wrote: Red Word that prompted transfer to Nurse Triage: Severe Allergic Reaction to Medication(Bactrim )

## 2024-01-13 ENCOUNTER — Encounter: Payer: Self-pay | Admitting: Family Medicine

## 2024-01-13 ENCOUNTER — Ambulatory Visit: Admitting: Family Medicine

## 2024-01-13 ENCOUNTER — Ambulatory Visit: Admitting: Physician Assistant

## 2024-01-13 ENCOUNTER — Ambulatory Visit: Payer: Self-pay

## 2024-01-13 VITALS — BP 120/80 | HR 102 | Temp 99.5°F | Ht 66.0 in | Wt 190.4 lb

## 2024-01-13 DIAGNOSIS — B356 Tinea cruris: Secondary | ICD-10-CM | POA: Diagnosis not present

## 2024-01-13 DIAGNOSIS — B370 Candidal stomatitis: Secondary | ICD-10-CM | POA: Insufficient documentation

## 2024-01-13 DIAGNOSIS — R1031 Right lower quadrant pain: Secondary | ICD-10-CM

## 2024-01-13 MED ORDER — FLUCONAZOLE 150 MG PO TABS
ORAL_TABLET | ORAL | 0 refills | Status: DC
Start: 2024-01-13 — End: 2024-01-22

## 2024-01-13 MED ORDER — NYSTATIN 100000 UNIT/ML MT SUSP: 5.0000 mL | Freq: Four times a day (QID) | OROMUCOSAL | 1 refills | Status: DC

## 2024-01-13 NOTE — Telephone Encounter (Signed)
  Patient is currently at PCP appointment.         Copied from CRM [id number]. Topic: Clinical - Red Word Triage >> Jan 13, 2024  9:06 AM Mesmerise C wrote: [facility] that prompted transfer to Nurse Triage: Patient is still experiencing yeast infection has gotten worse with pain

## 2024-01-13 NOTE — Progress Notes (Signed)
 Patient ID: Stephen Avila, male    DOB: July 12, 1975, 48 y.o.   MRN: [id number]  This visit was conducted in person.  BP 120/80   Pulse (!) 102   Temp 99.5 F (37.5 C) (Temporal)   Ht 5' 6 (1.676 m)   Wt 190 lb 6 oz (86.4 kg)   SpO2 96%   BMI 30.73 kg/m    CC:  Chief Complaint  Patient presents with   Blister    Took Bactrim  for possible infection-In Mouth and Genital   Abdominal Pain    LRQ pain-Seeing Urology today at 4:00 pm    Subjective:   HPI: Stephen Avila is a 48 y.o. male presenting on 01/13/2024 for Blister (Took Bactrim  for possible infection-In Mouth and Genital) and Abdominal Pain (LRQ pain-Seeing Urology today at 4:00 pm)   He has a history of prostate cancer.. treated with seed.  History of prostate infections, stones  Had leftover Bactrim  1 week ago right lower abdominal soreness, pinching  2 days after starting .[name] Noting sores in gum line.. stopped the BActrim .  Spread to side of tongue  Noting water blister sores at glans.  Swollen glands.  Trouble eating and drinking given the pain.  He has been using nystatin  ointment on genitals. Has had a yeast infection in past before    Has appt with Urologist today at 4 pm.  NML fasting CBG 99 on 11/10/2023 Per pt nml A1C at work in last several months.  Relevant past medical, surgical, family and social history reviewed and updated as indicated. Interim medical history since our last visit reviewed. Allergies and medications reviewed and updated. Outpatient Medications Prior to Visit  Medication Sig Dispense Refill   atorvastatin  (LIPITOR ) 80 MG tablet TAKE 1 TABLET(80 MG) BY MOUTH DAILY 90 tablet 1   buprenorphine  (SUBUTEX ) 8 MG SUBL SL tablet Place 20 mg under the tongue daily.      carvedilol  (COREG ) 12.5 MG tablet Take 1 tablet (12.5 mg total) by mouth 2 (two) times daily. 180 tablet 3   gabapentin  (NEURONTIN ) 300 MG capsule Take 1 capsule by mouth 3 (three) times daily.     ibuprofen (ADVIL) 600  MG tablet Take 600 mg by mouth every 6 (six) hours as needed.     nitroGLYCERIN  (NITROSTAT ) 0.4 MG SL tablet Place 1 tablet (0.4 mg total) under the tongue every 5 (five) minutes x 3 doses as needed for chest pain. 25 tablet 3   olmesartan  (BENICAR ) 40 MG tablet Take 1 tablet (40 mg total) by mouth daily. 90 tablet 3   tamsulosin  (FLOMAX ) 0.4 MG CAPS capsule Take 0.8 mg by mouth daily.     testosterone  cypionate (DEPOTESTOSTERONE CYPIONATE) 200 MG/ML injection Inject 0.5 mLs (100 mg total) into the muscle every 7 (seven) days. 10 mL 1   ticagrelor  (BRILINTA ) 90 MG TABS tablet Take 1 tablet (90 mg total) by mouth 2 (two) times daily. 180 tablet 3   No facility-administered medications prior to visit.     Per HPI unless specifically indicated in ROS section below Review of Systems  Constitutional:  Negative for fatigue and fever.  HENT:  Positive for dental problem and mouth sores. Negative for ear pain.   Eyes:  Negative for pain.  Respiratory:  Negative for cough and shortness of breath.   Cardiovascular:  Negative for chest pain, palpitations and leg swelling.  Gastrointestinal:  Positive for abdominal pain.  Genitourinary:  Positive for genital sores and penile pain. Negative  for dysuria.  Musculoskeletal:  Negative for arthralgias.  Neurological:  Negative for syncope, light-headedness and headaches.  Psychiatric/Behavioral:  Negative for dysphoric mood.    Objective:  BP 120/80   Pulse (!) 102   Temp 99.5 F (37.5 C) (Temporal)   Ht 5' 6 (1.676 m)   Wt 190 lb 6 oz (86.4 kg)   SpO2 96%   BMI 30.73 kg/m   Wt Readings from Last 3 Encounters:  01/13/24 190 lb 6 oz (86.4 kg)  11/10/23 193 lb (87.5 kg)  09/30/23 199 lb (90.3 kg)      Physical Exam Vitals reviewed.  Constitutional:      Appearance: He is well-developed.  HENT:     Head: Normocephalic.     Right Ear: Hearing normal.     Left Ear: Hearing normal.     Nose: Nose normal.     Mouth/Throat:     Mouth: Oral  lesions present.     Dentition: Dental caries and gum lesions present.   Neck:     Thyroid : No thyroid  mass or thyromegaly.     Vascular: No carotid bruit.     Trachea: Trachea normal.  Cardiovascular:     Rate and Rhythm: Normal rate and regular rhythm.     Pulses: Normal pulses.     Heart sounds: Heart sounds not distant. No murmur heard.    No friction rub. No gallop.     Comments: No peripheral edema Pulmonary:     Effort: Pulmonary effort is normal. No respiratory distress.     Breath sounds: Normal breath sounds.  Abdominal:     Tenderness: There is abdominal tenderness in the right lower quadrant. There is no right CVA tenderness, left CVA tenderness, guarding or rebound.     Hernia: No hernia is present.  Genitourinary:      Comments: Ulcer  penial shaft circumscribing penis at base of glans, very sore to touch Skin:    General: Skin is warm and dry.     Findings: No rash.  Psychiatric:        Speech: Speech normal.        Behavior: Behavior normal.        Thought Content: Thought content normal.       Results for orders placed or performed in visit on 11/10/23  Comprehensive metabolic panel with GFR   Collection Time: 11/10/23 10:53 AM  Result Value Ref Range   Glucose 99 70 - 99 mg/dL   BUN 12 6 - 24 mg/dL   Creatinine, Ser 9.00 0.76 - 1.27 mg/dL   eGFR 94 >40 fO/fpw/8.26   BUN/Creatinine Ratio 12 9 - 20   Sodium 139 134 - 144 mmol/L   Potassium 4.2 3.5 - 5.2 mmol/L   Chloride 102 96 - 106 mmol/L   CO2 21 20 - 29 mmol/L   Calcium  9.2 8.7 - 10.2 mg/dL   Total Protein 7.0 6.0 - 8.5 g/dL   Albumin 4.1 4.1 - 5.1 g/dL   Globulin, Total 2.9 1.5 - 4.5 g/dL   Bilirubin Total 0.8 0.0 - 1.2 mg/dL   Alkaline Phosphatase 122 (H) 44 - 121 IU/L   AST 22 0 - 40 IU/L   ALT 28 0 - 44 IU/L  TSH   Collection Time: 11/10/23 10:53 AM  Result Value Ref Range   TSH 0.327 (L) 0.450 - 4.500 uIU/mL  Lipid panel   Collection Time: 11/10/23 10:53 AM  Result Value Ref Range    Cholesterol, Total  139 100 - 199 mg/dL   Triglycerides 82 0 - 149 mg/dL   HDL 35 (L) >60 mg/dL   VLDL Cholesterol Cal 16 5 - 40 mg/dL   LDL Chol Calc (NIH) 88 0 - 99 mg/dL   Chol/HDL Ratio 4.0 0.0 - 5.0 ratio  Lipoprotein A (LPA)   Collection Time: 11/10/23 10:53 AM  Result Value Ref Range   Lipoprotein (a) <8.4 <75.0 nmol/L  CBC   Collection Time: 11/10/23 10:53 AM  Result Value Ref Range   WBC 5.5 3.4 - 10.8 x10E3/uL   RBC 5.29 4.14 - 5.80 x10E6/uL   Hemoglobin 15.5 13.0 - 17.7 g/dL   Hematocrit 54.4 62.4 - 51.0 %   MCV 86 79 - 97 fL   MCH 29.3 26.6 - 33.0 pg   MCHC 34.1 31.5 - 35.7 g/dL   RDW 87.7 88.3 - 84.5 %   Platelets 239 150 - 450 x10E3/uL    Assessment and Plan  Oral yeast infection  Fungal infection of the groin  RLQ abdominal pain  Other orders -     Fluconazole ; Take 1 tab po today, repeat in 2 days if needed  Dispense: 2 tablet; Refill: 0 -     magic mouthwash (nystatin , lidocaine , diphenhydrAMINE ) suspension; Take 5 mLs by mouth 4 (four) times daily.  Dispense: 180 mL; Refill: 1   Acute oral and genital lesions occurring after Bactrim  use.  Possible medication side effect/allergy versus yeast infection.  I am leaning towards yeast infection given appearance of the oral cavity with white plaque as well as given the fact that symptoms are not improving.  He had no hives, tongue or lip swelling.  No shortness of breath. He has already stopped the Bactrim . Will treat with fluconazole  150 mg p.o. x 1 with repeat in 2 days if needed. Will start Magic mouthwash containing lidocaine , nystatin  and diphenhydramine  4 times daily as needed. Use topical nystatin  cream on penis.  Right lower quadrant pain concerning for possible stone versus UTI but patient has an appointment today with his urologist at 4 PM for this issue.  Return and ER precautions provided. Return if symptoms worsen or fail to improve.   Greig Ring, MD

## 2024-01-13 NOTE — Telephone Encounter (Addendum)
 Will await evaluation for this morning. I didn't get message until after 5pm. He refused UCC/ER last night.

## 2024-01-13 NOTE — Telephone Encounter (Signed)
  Patient's girlfriend called in stating he never made it to Dr. Irene this am.  During transfer from agent, Damien stated that the patient was actively calling her and disconnected the line to talk to him.  Will call back if new apt is needed.        Copied from CRM [id number]. Topic: Clinical - Red Word Triage >> Jan 13, 2024  8:57 AM Martinique E wrote: [facility] that prompted transfer to Nurse Triage: Patient is having genital and mouth blisters. Along with tongue and throat swelling / pain. Girlfriend Damien on the line. Patient missed his appointment this morning. This encounter was created in error - please disregard.

## 2024-01-14 ENCOUNTER — Ambulatory Visit: Admitting: Physician Assistant

## 2024-01-14 ENCOUNTER — Telehealth: Payer: Self-pay | Admitting: Nurse Practitioner

## 2024-01-14 VITALS — BP 152/98 | HR 99 | Ht 66.0 in | Wt 190.0 lb

## 2024-01-14 DIAGNOSIS — N489 Disorder of penis, unspecified: Secondary | ICD-10-CM

## 2024-01-14 DIAGNOSIS — C61 Malignant neoplasm of prostate: Secondary | ICD-10-CM

## 2024-01-14 DIAGNOSIS — E291 Testicular hypofunction: Secondary | ICD-10-CM | POA: Diagnosis not present

## 2024-01-14 DIAGNOSIS — Z125 Encounter for screening for malignant neoplasm of prostate: Secondary | ICD-10-CM | POA: Diagnosis not present

## 2024-01-14 DIAGNOSIS — R399 Unspecified symptoms and signs involving the genitourinary system: Secondary | ICD-10-CM | POA: Diagnosis not present

## 2024-01-14 LAB — URINALYSIS, COMPLETE
Bilirubin, UA: NEGATIVE
Ketones, UA: NEGATIVE
Leukocytes,UA: NEGATIVE
Nitrite, UA: NEGATIVE
RBC, UA: NEGATIVE
Specific Gravity, UA: 1.03 (ref 1.005–1.030)
Urobilinogen, Ur: 2 mg/dL — ABNORMAL HIGH (ref 0.2–1.0)
pH, UA: 6 (ref 5.0–7.5)

## 2024-01-14 LAB — MICROSCOPIC EXAMINATION

## 2024-01-14 MED ORDER — OLMESARTAN MEDOXOMIL 40 MG PO TABS: 40.0000 mg | ORAL_TABLET | Freq: Every day | ORAL | 3 refills | Status: DC

## 2024-01-14 NOTE — Telephone Encounter (Signed)
 Pt's medicaiton was sent to pt's pharmacy as requested. Confirmation received.

## 2024-01-14 NOTE — Telephone Encounter (Signed)
*  STAT* If patient is at the pharmacy, call can be transferred to refill team.   1. Which medications need to be refilled? (please list name of each medication and dose if known)   olmesartan  (BENICAR ) 40 MG tablet     4. Which pharmacy/location (including street and city if local pharmacy) is medication to be sent to? [id number] [address], [place] - [address] AT [facility] [address] Phone: [phone]  Fax: [phone]       5. Do they need a 30 day or 90 day supply? 90    Pt completely out

## 2024-01-15 ENCOUNTER — Ambulatory Visit: Payer: Self-pay | Admitting: Urology

## 2024-01-15 LAB — RPR: RPR Ser Ql: NONREACTIVE

## 2024-01-15 LAB — PSA: Prostate Specific Ag, Serum: 0.9 ng/mL (ref 0.0–4.0)

## 2024-01-15 LAB — HIV ANTIBODY (ROUTINE TESTING W REFLEX): HIV Screen 4th Generation wRfx: NONREACTIVE

## 2024-01-16 ENCOUNTER — Telehealth: Payer: Self-pay

## 2024-01-16 ENCOUNTER — Other Ambulatory Visit: Payer: Self-pay | Admitting: Urology

## 2024-01-16 ENCOUNTER — Other Ambulatory Visit

## 2024-01-16 DIAGNOSIS — N489 Disorder of penis, unspecified: Secondary | ICD-10-CM | POA: Diagnosis not present

## 2024-01-16 MED ORDER — CLOTRIMAZOLE-BETAMETHASONE 1-0.05 % EX CREA: 1.0000 | TOPICAL_CREAM | Freq: Two times a day (BID) | CUTANEOUS | 0 refills | Status: AC

## 2024-01-16 NOTE — Telephone Encounter (Signed)
 Called pt- went to VM. Left message on pts cell to contact office per SV needs a lab appt today. -Let's bring him back in today with a lab visit with dirty catch UA for gonorrhea and chlamydia testing and also HSV testing--both orders are in   Also called Pts GF (on drp)- Damien- Asked to speak to pt. She states he is at work. Advised he needs pt needs lab appt today.   She advised he could be here at 3pm. Added pt lab schedule.  Delon with lab aware. Also informed Damien that lotrmin was erxed to pharmacy.  Damien states she will inform pt.

## 2024-01-16 NOTE — Addendum Note (Signed)
 Addended by: Kamryn Gauthier P on: 01/16/2024 11:21 AM   Modules accepted: Orders

## 2024-01-16 NOTE — Telephone Encounter (Signed)
 this pt was seen on Wednesday by Sam for ulcers on penis (note isn't complete). negative for syphilis and HIV. pt called stating he is still having the same symptoms, no improvement and getting worse. Symptoms are redness on genital area, discharge, burning and discomfort when urinating. He wasn't send anything in. Pt's girlfriend states the pt is now weeping discharge, has pelvic pain and back pain. I sent a secure chat to Dr.Sninsky.

## 2024-01-16 NOTE — Telephone Encounter (Signed)
 Let's bring him back in today with a lab visit with dirty catch UA for gonorrhea and chlamydia testing and also HSV testing--both orders are in.

## 2024-01-16 NOTE — Progress Notes (Signed)
 01/14/2024 10:50 AM   Stephen Avila 1976/06/23 [id number]  CC: Chief Complaint  Patient presents with   penile lesion   HPI: Stephen Avila is a 48 y.o. 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   Today he reports he had some right pelvic pain last week and, thinking it was a prostate infection, took 3-4 days of Bactrim  that he had left over at home. After the first dose, he developed burning, itching, painful oral and penile ulcers. He also took nystatin  cream and oral diflucan  for the itching in case it was a fungal infection. The right pelvic pain is now radiating into his RLE, and he admits to a history of chronic back problems and radicular back pain. After I left the room, he requested a refill of his testosterone  as well.  Per chart review, he previously mentioned a penile lesion to Dr. Francisca in March.  In-office UA today positive for trace glucose, trace protein, and 2.0 EU/dL urobilinogen; urine microscopy with 6-10 WBCs/HPF, hyaline and granular casts, calcium  oxalate crystals, and moderate bacteria.  PMH: Past Medical History:  Diagnosis Date   Anxiety    Constipation    GERD (gastroesophageal reflux disease)    Heart murmur    History of chicken pox    Hypertension    Kidney stones    MI (mitral incompetence)    Prostate cancer (HCC)    PUD (peptic ulcer disease)     Surgical History: Past Surgical History:  Procedure Laterality Date   CHOLECYSTECTOMY N/A 06/26/2015   Procedure: LAPAROSCOPIC CHOLECYSTECTOMY;  Surgeon: Oneil Budge, MD;  Location: [facility];  Service: General;  Laterality: N/A;   COLONOSCOPY WITH PROPOFOL  N/A 05/07/2019   Procedure: COLONOSCOPY WITH PROPOFOL ;  Surgeon: Unk Corinn Skiff, MD;  Location: [facility] ENDOSCOPY;  Service: Gastroenterology;  Laterality: N/A;   CORONARY/GRAFT ACUTE MI REVASCULARIZATION N/A 11/07/2019    Procedure: Coronary/Graft Acute MI Revascularization;  Surgeon: Burnard Debby LABOR, MD;  Location: [facility] INVASIVE CV LAB;  Service: Cardiovascular;  Laterality: N/A;   ESOPHAGOGASTRODUODENOSCOPY N/A 05/07/2019   Procedure: ESOPHAGOGASTRODUODENOSCOPY (EGD);  Surgeon: Unk Corinn Skiff, MD;  Location: [facility] ENDOSCOPY;  Service: Gastroenterology;  Laterality: N/A;   HERNIA REPAIR  1979, 1981   x 2    LEFT HEART CATH AND CORONARY ANGIOGRAPHY N/A 11/07/2019   Procedure: LEFT HEART CATH AND CORONARY ANGIOGRAPHY;  Surgeon: Burnard Debby LABOR, MD;  Location: [facility] INVASIVE CV LAB;  Service: Cardiovascular;  Laterality: N/A;   LITHOTRIPSY     multiple    Home Medications:  Allergies as of 01/14/2024       Reactions   Erythromycin     Made chest feel funny   Bactrim  [sulfamethoxazole -trimethoprim ] Other (See Comments)   Oral, penile ulcers        Medication List        Accurate as of January 14, 2024 11:59 PM. If you have any questions, ask your nurse or doctor.          atorvastatin  80 MG tablet Commonly known as: LIPITOR  TAKE 1 TABLET(80 MG) BY MOUTH DAILY   buprenorphine  8 MG Subl SL tablet Commonly known as: SUBUTEX  Place 20 mg under the tongue daily.   carvedilol  12.5 MG tablet Commonly known as: COREG  Take 1 tablet (12.5 mg total) by mouth 2 (two) times daily.   fluconazole  150 MG tablet Commonly known as: DIFLUCAN  Take  1 tab po today, repeat in 2 days if needed   gabapentin  300 MG capsule Commonly known as: NEURONTIN  Take 1 capsule by mouth 3 (three) times daily.   ibuprofen 600 MG tablet Commonly known as: ADVIL Take 600 mg by mouth every 6 (six) hours as needed.   magic mouthwash (nystatin , lidocaine , diphenhydrAMINE ) suspension Take 5 mLs by mouth 4 (four) times daily.   nitroGLYCERIN  0.4 MG SL tablet Commonly known as: NITROSTAT  Place 1 tablet (0.4 mg total) under the tongue every 5 (five) minutes x 3 doses as needed for chest pain.   olmesartan  40 MG  tablet Commonly known as: BENICAR  Take 1 tablet (40 mg total) by mouth daily.   tamsulosin  0.4 MG Caps capsule Commonly known as: FLOMAX  Take 0.8 mg by mouth daily.   testosterone  cypionate 200 MG/ML injection Commonly known as: DEPOTESTOSTERONE CYPIONATE Inject 0.5 mLs (100 mg total) into the muscle every 7 (seven) days.   ticagrelor  90 MG Tabs tablet Commonly known as: Brilinta  Take 1 tablet (90 mg total) by mouth 2 (two) times daily.        Allergies:  Allergies  Allergen Reactions   Erythromycin      Made chest feel funny   Bactrim  [Sulfamethoxazole -Trimethoprim ] Other (See Comments)    Oral, penile ulcers    Family History: Family History  Problem Relation Age of Onset   COPD Mother    Arthritis Mother    Asthma Mother    Lung cancer Mother        smoker   Early death Mother 79   Heart attack Mother    Heart disease Mother    Learning disabilities Mother    Miscarriages / Stillbirths Mother    Heart failure Father    Diabetes Father    Early death Father 73   Heart attack Father    Learning disabilities Father    Thyroid  disease Neg Hx     Social History:   reports that he has never smoked. He has never been exposed to tobacco smoke. His smokeless tobacco use includes snuff. He reports current alcohol use. He reports that he does not use drugs.  Physical Exam: BP (!) 152/98   Pulse 99   Ht 5' 6 (1.676 m)   Wt 190 lb (86.2 kg)   BMI 30.67 kg/m   Constitutional:  Alert and oriented, no acute distress, nontoxic appearing HEENT: [place], AT. Two ulcers of the inner lower lip mucosa Cardiovascular: No clubbing, cyanosis, or edema Respiratory: Normal respiratory effort, no increased work of breathing GU: One large ulcer of the ventral glans/corona. Glans is diffusely inflamed. Skin: No rashes, bruises or suspicious lesions Neurologic: Grossly intact, no focal deficits, moving all 4 extremities Psychiatric: Normal mood and affect  Laboratory  Data: Results for orders placed or performed in visit on 01/14/24  Microscopic Examination   Collection Time: 01/14/24  2:30 PM   Urine  Result Value Ref Range   WBC, UA 6-10 (A) 0 - 5 /hpf   RBC, Urine 0-2 0 - 2 /hpf   Epithelial Cells (non renal) 0-10 0 - 10 /hpf   Casts Present (A) None seen /lpf   Cast Type Hyaline casts N/A   Crystals Present (A) N/A   Crystal Type Calcium  Oxalate N/A   Mucus, UA Present (A) Not Estab.   Bacteria, UA Moderate (A) None seen/Few  Urinalysis, Complete   Collection Time: 01/14/24  2:30 PM  Result Value Ref Range   Specific Gravity, UA 1.030 1.005 -  1.030   pH, UA 6.0 5.0 - 7.5   Color, UA Amber (A) Yellow   Appearance Ur Clear Clear   Leukocytes,UA Negative Negative   Protein,UA Trace Negative/Trace   Glucose, UA Trace (A) Negative   Ketones, UA Negative Negative   RBC, UA Negative Negative   Bilirubin, UA Negative Negative   Urobilinogen, Ur 2.0 (H) 0.2 - 1.0 mg/dL   Nitrite, UA Negative Negative   Microscopic Examination Comment    Microscopic Examination See below:   RPR   Collection Time: 01/14/24  3:37 PM  Result Value Ref Range   RPR Ser Ql Non Reactive Non Reactive   Interpretation: Comment   HIV antibody (with reflex)   Collection Time: 01/14/24  3:37 PM  Result Value Ref Range   HIV Screen 4th Generation wRfx Non Reactive Non Reactive  PSA   Collection Time: 01/14/24  3:37 PM  Result Value Ref Range   Prostate Specific Ag, Serum 0.9 0.0 - 4.0 ng/mL   Assessment & Plan:   1. Lower urinary tract symptoms (LUTS) (Primary) His right pelvic pain is now radiating into the RLE, more consistent with radicular back pain than prostatitis and he agrees. Continue Flomax . - Urinalysis, Complete  2. Penile lesion Given onset after first dose of Bactrim , I strongly suspect fixed drug eruption. Cannot rule out Behcets with oral involvement, and I asked him to return to clinic with future episodes so we can investigate this further.  Lesions are painful, low suspicion for syphilis though will get RPR and HIV antibody to rule these out. Will bring him back for HSV testing lab visit next week. In the meantime, I added Bactrim  to his allergy list. - RPR - HIV antibody (with reflex)  3. Prostate cancer (HCC) I don't think his recent pain is associated, though will recheck PSA to confirm, see #1 above. - PSA  4. Hypogonadism in male May refill testosterone  depending on PSA as above.  Return for Will call with results.  Lucie Hones, PA-C  [facility] [place] [address], [address] [place], [place] [address] [phone]

## 2024-01-18 LAB — CT NG M GENITALIUM NAA, URINE
Chlamydia trachomatis, NAA: NEGATIVE
Mycoplasma genitalium NAA: NEGATIVE
Neisseria gonorrhoeae, NAA: NEGATIVE

## 2024-01-20 LAB — HSV DNA BY PCR (REFERENCE LAB)
HSV 2 DNA: NEGATIVE
HSV-1 DNA: NEGATIVE

## 2024-01-22 ENCOUNTER — Encounter: Payer: Self-pay | Admitting: Primary Care

## 2024-01-22 ENCOUNTER — Telehealth: Payer: Self-pay | Admitting: Primary Care

## 2024-01-22 ENCOUNTER — Ambulatory Visit (INDEPENDENT_AMBULATORY_CARE_PROVIDER_SITE_OTHER): Admitting: Primary Care

## 2024-01-22 VITALS — BP 180/96 | HR 86 | Temp 98.0°F | Ht 65.0 in | Wt 188.5 lb

## 2024-01-22 DIAGNOSIS — R21 Rash and other nonspecific skin eruption: Secondary | ICD-10-CM | POA: Diagnosis not present

## 2024-01-22 NOTE — Patient Instructions (Signed)
 Pick up the cream at the pharmacy which is called clotrimazole -betamethasone .  Please call us  if this cream is not the pharmacy.  Will be in touch once we receive the results.  It was a pleasure meeting you!

## 2024-01-22 NOTE — Assessment & Plan Note (Signed)
 Unclear etiology.  Given slight improvement with antifungal treatment, recommend topical steroid with antifungal. After further chart review it appears that a prescription for clotrimazole -betamethasone  was prescribed on 01/16/2024 per urology.  He has not used this so he will pick up in the pharmacy today.  Swabs obtained today including wound culture and viral culture.  Follow-up with urology.

## 2024-01-22 NOTE — Telephone Encounter (Signed)
 Lvm for patient to talk to Stephen Avila in the lab.

## 2024-01-22 NOTE — Progress Notes (Signed)
 Subjective:    Patient ID: Stephen Avila, male    DOB: 10/17/1975, 48 y.o.   MRN: [id number]  Rash Pertinent negatives include no fever.    Stephen Avila is a very pleasant 48 y.o. male patient of Dr. UTIs with a history of hypertension, CAD, oral yeast infection, pituitary insufficiency, hypogonadism, hyperlipidemia, renal stones, prostate cancer GI bleed who presents today to discuss rash.   Evaluated by urology on 01/14/2024 for right pelvic pain.  He developed burning, itching, painful oral and penile ulcers after taking 3 to 4 days of Bactrim  prescription that he had at home.  Urinalysis was positive for protein, trace glucose, microscopic with 6-10 WBCs HPF, moderate bacteria.  During this visit labs are collected including RPR and HIV.  He was recommended to return the following week for HSV sampling.  Syphilis and HIV testing were negative.  He phoned in 2 days later with reports of penile discharge, pelvic pain, back pain.  Urinalysis for gonorrhea/chlamydia and HSV testing were negative.  Evaluated by Dr. Avelina on 01/13/2024 for same symptoms.  He was treated with fluconazole  1 tablet x 2 doses and Magic mouthwash.  He was advised to use topical nystatin  cream to genital region.  Today he presents with ongoing penile blistering rash that began after taking Bactrim  DS tablets. Also with clear drainage to the sores on his penis which are now peeling. His rash/sores are painful, have bleed through his underwear. He's been using Nystatin  ointment with improvement to his itching. Overall symptoms have improved, but today he noticed increased burning to the rash after his shower. He completed both fluconazole  tablets.   He denies hematuria, dysuria, difficulty urinating.  He has not taken any of his blood pressure medications for weeks.  Review of Systems  Constitutional:  Negative for fever.  Genitourinary:  Positive for genital sores and penile pain. Negative for dysuria, frequency,  hematuria, penile swelling and urgency.  Skin:  Positive for rash.         Past Medical History:  Diagnosis Date   Anxiety    Constipation    GERD (gastroesophageal reflux disease)    Heart murmur    History of chicken pox    Hypertension    Kidney stones    MI (mitral incompetence)    Prostate cancer (HCC)    PUD (peptic ulcer disease)     Social History   Socioeconomic History   Marital status: Divorced    Spouse name: Not on file   Number of children: Not on file   Years of education: Not on file   Highest education level: Not on file  Occupational History   Not on file  Tobacco Use   Smoking status: Never    Passive exposure: Never   Smokeless tobacco: Current    Types: Snuff    Last attempt to quit: 09/27/2020  Vaping Use   Vaping status: Never Used  Substance and Sexual Activity   Alcohol use: Yes    Comment: occasional   Drug use: No   Sexual activity: Yes    Partners: Female  Other Topics Concern   Not on file  Social History Narrative   Friend of Damien Custard   Right handed   Lives alone, dog   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    Edu: HS   Social Drivers of Health   Financial Resource Strain: Low Risk  (05/05/2019)   Overall Financial Resource Strain (CARDIA)    Difficulty of Paying Living  Expenses: Not hard at all  Food Insecurity: No Food Insecurity (05/05/2019)   Hunger Vital Sign    Worried About Running Out of Food in the Last Year: Never true    Ran Out of Food in the Last Year: Never true  Transportation Needs: No Transportation Needs (05/05/2019)   PRAPARE - Administrator, Civil Service (Medical): No    Lack of Transportation (Non-Medical): No  Physical Activity: Sufficiently Active (05/05/2019)   Exercise Vital Sign    Days of Exercise per Week: 6 days    Minutes of Exercise per Session: 150+ min  Stress: Stress Concern Present (05/05/2019)   [employer] of Occupational Health - Occupational Stress Questionnaire     Feeling of Stress : Very much  Social Connections: Moderately Isolated (05/05/2019)   Social Connection and Isolation Panel    Frequency of Communication with Friends and Family: More than three times a week    Frequency of Social Gatherings with Friends and Family: Once a week    Attends Religious Services: 1 to 4 times per year    Active Member of [employer] or Organizations: No    Attends Banker Meetings: Never    Marital Status: Divorced  Catering manager Violence: Not At Risk (05/05/2019)   Humiliation, Afraid, Rape, and Kick questionnaire    Fear of Current or Ex-Partner: No    Emotionally Abused: No    Physically Abused: No    Sexually Abused: No    Past Surgical History:  Procedure Laterality Date   CHOLECYSTECTOMY N/A 06/26/2015   Procedure: LAPAROSCOPIC CHOLECYSTECTOMY;  Surgeon: Oneil Budge, MD;  Location: [facility];  Service: General;  Laterality: N/A;   COLONOSCOPY WITH PROPOFOL  N/A 05/07/2019   Procedure: COLONOSCOPY WITH PROPOFOL ;  Surgeon: Unk Corinn Skiff, MD;  Location: [facility] ENDOSCOPY;  Service: Gastroenterology;  Laterality: N/A;   CORONARY/GRAFT ACUTE MI REVASCULARIZATION N/A 11/07/2019   Procedure: Coronary/Graft Acute MI Revascularization;  Surgeon: Burnard Debby LABOR, MD;  Location: [facility] INVASIVE CV LAB;  Service: Cardiovascular;  Laterality: N/A;   ESOPHAGOGASTRODUODENOSCOPY N/A 05/07/2019   Procedure: ESOPHAGOGASTRODUODENOSCOPY (EGD);  Surgeon: Unk Corinn Skiff, MD;  Location: [facility] ENDOSCOPY;  Service: Gastroenterology;  Laterality: N/A;   HERNIA REPAIR  1979, 1981   x 2    LEFT HEART CATH AND CORONARY ANGIOGRAPHY N/A 11/07/2019   Procedure: LEFT HEART CATH AND CORONARY ANGIOGRAPHY;  Surgeon: Burnard Debby LABOR, MD;  Location: [facility] INVASIVE CV LAB;  Service: Cardiovascular;  Laterality: N/A;   LITHOTRIPSY     multiple    Family History  Problem Relation Age of Onset   COPD Mother    Arthritis Mother    Asthma Mother    Lung cancer Mother        smoker    Early death Mother 46   Heart attack Mother    Heart disease Mother    Learning disabilities Mother    Miscarriages / Stillbirths Mother    Heart failure Father    Diabetes Father    Early death Father 67   Heart attack Father    Learning disabilities Father    Thyroid  disease Neg Hx     Allergies  Allergen Reactions   Erythromycin      Made chest feel funny   Bactrim  [Sulfamethoxazole -Trimethoprim ] Other (See Comments)    Oral, penile ulcers    Current Outpatient Medications on File Prior to Visit  Medication Sig Dispense Refill   atorvastatin  (LIPITOR ) 80 MG tablet TAKE 1 TABLET(80 MG) BY  MOUTH DAILY 90 tablet 1   buprenorphine  (SUBUTEX ) 8 MG SUBL SL tablet Place 20 mg under the tongue daily.      carvedilol  (COREG ) 12.5 MG tablet Take 1 tablet (12.5 mg total) by mouth 2 (two) times daily. 180 tablet 3   clotrimazole -betamethasone  (LOTRISONE ) cream Apply 1 Application topically 2 (two) times daily. 30 g 0   gabapentin  (NEURONTIN ) 300 MG capsule Take 1 capsule by mouth 3 (three) times daily.     ibuprofen (ADVIL) 600 MG tablet Take 600 mg by mouth every 6 (six) hours as needed.     nitroGLYCERIN  (NITROSTAT ) 0.4 MG SL tablet Place 1 tablet (0.4 mg total) under the tongue every 5 (five) minutes x 3 doses as needed for chest pain. 25 tablet 3   olmesartan  (BENICAR ) 40 MG tablet Take 1 tablet (40 mg total) by mouth daily. 90 tablet 3   tamsulosin  (FLOMAX ) 0.4 MG CAPS capsule Take 0.8 mg by mouth daily.     testosterone  cypionate (DEPOTESTOSTERONE CYPIONATE) 200 MG/ML injection Inject 0.5 mLs (100 mg total) into the muscle every 7 (seven) days. 10 mL 1   ticagrelor  (BRILINTA ) 90 MG TABS tablet Take 1 tablet (90 mg total) by mouth 2 (two) times daily. 180 tablet 3   No current facility-administered medications on file prior to visit.    BP (!) 180/96 (BP Location: Right Arm, Patient Position: Sitting, Cuff Size: Normal)   Pulse 86   Temp 98 F (36.7 C) (Oral)   Ht 5' 5  (1.651 m)   Wt 188 lb 8 oz (85.5 kg)   SpO2 97%   BMI 31.37 kg/m  Objective:   Physical Exam Exam conducted with a chaperone present.  Cardiovascular:     Rate and Rhythm: Normal rate.  Pulmonary:     Effort: Pulmonary effort is normal.  Genitourinary:    Penis: Circumcised. Tenderness and lesions present. No discharge.      Comments: Erythema to penile tip and shaft with intact pustule like lesions to posterior shaft.  Moderate skin breakdown to penile head. Neurological:     Mental Status: He is alert.           Assessment & Plan:  Penile rash Assessment & Plan: Unclear etiology.  Given slight improvement with antifungal treatment, recommend topical steroid with antifungal. After further chart review it appears that a prescription for clotrimazole -betamethasone  was prescribed on 01/16/2024 per urology.  He has not used this so he will pick up in the pharmacy today.  Swabs obtained today including wound culture and viral culture.  Follow-up with urology.         Lukas Pelcher K Taariq Leitz, NP

## 2024-01-23 ENCOUNTER — Ambulatory Visit: Payer: Self-pay | Admitting: Primary Care

## 2024-01-25 LAB — WOUND CULTURE
MICRO NUMBER:: 16712280
SPECIMEN QUALITY:: ADEQUATE

## 2024-01-26 ENCOUNTER — Telehealth: Payer: Self-pay

## 2024-01-26 ENCOUNTER — Other Ambulatory Visit: Payer: Self-pay | Admitting: Primary Care

## 2024-01-26 DIAGNOSIS — A4901 Methicillin susceptible Staphylococcus aureus infection, unspecified site: Secondary | ICD-10-CM

## 2024-01-26 LAB — TIQ-MISC

## 2024-01-26 MED ORDER — DOXYCYCLINE HYCLATE 100 MG PO TABS: 100.0000 mg | ORAL_TABLET | Freq: Two times a day (BID) | ORAL | 0 refills | Status: DC

## 2024-01-26 NOTE — Telephone Encounter (Signed)
 Stephen Avila

## 2024-01-26 NOTE — Telephone Encounter (Signed)
 Copied from CRM [id number]. Topic: Clinical - Request for Lab/Test Order >> Jan 26, 2024  1:20 PM Donna BRAVO wrote: Reason for CRM: P[place] with [facility] [phone] asking where the sample was collected from, what body sent in on 01/22/24 warm transferred to lab

## 2024-01-26 NOTE — Telephone Encounter (Signed)
 Sending note to [place] pool

## 2024-01-26 NOTE — Telephone Encounter (Signed)
 Lab results were relayed to patient this morning.

## 2024-01-29 LAB — HERPES SIMPLEX VIRUS CULTURE W/RFLX TO TYPING

## 2024-01-29 LAB — VIRAL CULTURE VIRC

## 2024-03-01 ENCOUNTER — Telehealth: Payer: Self-pay | Admitting: Family Medicine

## 2024-03-01 NOTE — Telephone Encounter (Signed)
 Pt due for CPE - plz call to offer to schedule at next available.

## 2024-03-01 NOTE — Telephone Encounter (Signed)
Lvm and sent mychart message to schedule

## 2024-03-04 NOTE — Telephone Encounter (Signed)
 Lvm to schedule

## 2024-04-20 ENCOUNTER — Telehealth: Payer: Self-pay | Admitting: Urology

## 2024-04-20 DIAGNOSIS — E291 Testicular hypofunction: Secondary | ICD-10-CM

## 2024-04-20 NOTE — Telephone Encounter (Signed)
 Patient's wife Damien called requesting a script for Testosterone  be sent to pharmacy at [facility] on [address]. Please advise.

## 2024-04-20 NOTE — Telephone Encounter (Signed)
 Patient is overdue for labs and needs an appointment. Please schedule lab visit for Testosterone  and then follow up with office visit can be with PA. Thanks

## 2024-04-21 ENCOUNTER — Other Ambulatory Visit

## 2024-04-21 DIAGNOSIS — E291 Testicular hypofunction: Secondary | ICD-10-CM

## 2024-04-22 ENCOUNTER — Other Ambulatory Visit: Payer: Self-pay | Admitting: Urology

## 2024-04-22 ENCOUNTER — Ambulatory Visit: Payer: Self-pay | Admitting: Urology

## 2024-04-22 LAB — HEMOGLOBIN AND HEMATOCRIT, BLOOD
Hematocrit: 45.6 % (ref 37.5–51.0)
Hemoglobin: 15.3 g/dL (ref 13.0–17.7)

## 2024-04-22 LAB — TESTOSTERONE: Testosterone: 231 ng/dL — ABNORMAL LOW (ref 264–916)

## 2024-04-22 MED ORDER — TESTOSTERONE CYPIONATE 200 MG/ML IM SOLN: 100.0000 mg | INTRAMUSCULAR | 2 refills | Status: AC

## 2024-04-26 ENCOUNTER — Ambulatory Visit: Admitting: Physician Assistant

## 2024-05-03 ENCOUNTER — Ambulatory Visit: Admitting: Physician Assistant

## 2024-06-16 NOTE — Progress Notes (Signed)
 [facility] [address] [place] [address] Phone: [phone]   Assessment and Plan:     1. Neck pain (Primary) 2. Chronic right shoulder pain 3. Right arm weakness 4. Numbness and tingling of right arm -Chronic with exacerbation, complicated, initial sports medicine visit - Patient presenting with significant neck pain, right upper extremity pain, that has now progressed to severe weakness in right upper extremity.  Patient had significant degenerative changes seen on prior MRI from 2024 that have likely progressed and are leading to current symptoms. - Based on patient's new symptoms, significantly worsening symptoms, new right upper extremity weakness, recommend repeating C-spine x-ray and MRI due to red flag signs - Start prednisone  Dosepak - Due to red flag sign of right upper extremity weakness, we will refer to neurosurgery while awaiting updated C-spine MRI   Pertinent previous records reviewed include    C-spine MRI 02/19/2023 IMPRESSION: 1. Multilevel cervical spondylosis with resultant moderate to severe diffuse spinal stenosis at C3-4 through C6-7. Secondary cord flattening at multiple levels as above, but with no cord signal changes. 2. Multifactorial degenerative changes with resultant multilevel foraminal narrowing as above. Notable findings include moderate bilateral C4 foraminal stenosis, severe bilateral C5 through C7 foraminal narrowing, with severe right and moderate left C8 foraminal stenosis.   Follow Up: 1 week after MRI to review results and discuss treatment plan.  May be done via telemedicine   Subjective:   I, Stephen Avila, am serving as a neurosurgeon for Doctor Stephen Avila  Chief Complaint: right shoulder pain   HPI:   06/17/2024 Patient is a 48 year old male with right shoulder pain. Patient states R tendon tear elbow 1 year ago. R elbow pain , shoulder and neck pain. Decreased RO.  Decreased grip strength. Endorses numbness and tingling that starts at the neck and goes down the arm. Aleve helps with the pain.   Relevant Historical Information: Hypertension, chronic pain syndrome  Additional pertinent review of systems negative.   Current Outpatient Medications:    methylPREDNISolone (MEDROL DOSEPAK) 4 MG TBPK tablet, Take 6 tablets on day 1.  Take 5 tablets on day 2.  Take 4 tablets on day 3.  Take 3 tablets on day 4.  Take 2 tablets on day 5.  Take 1 tablet on day 6., Disp: 21 tablet, Rfl: 0   atorvastatin  (LIPITOR ) 80 MG tablet, TAKE 1 TABLET(80 MG) BY MOUTH DAILY, Disp: 90 tablet, Rfl: 1   buprenorphine  (SUBUTEX ) 8 MG SUBL SL tablet, Place 20 mg under the tongue daily. , Disp: , Rfl:    carvedilol  (COREG ) 12.5 MG tablet, Take 1 tablet (12.5 mg total) by mouth 2 (two) times daily., Disp: 180 tablet, Rfl: 3   clotrimazole -betamethasone  (LOTRISONE ) cream, Apply 1 Application topically 2 (two) times daily., Disp: 30 g, Rfl: 0   doxycycline  (VIBRA -TABS) 100 MG tablet, Take 1 tablet (100 mg total) by mouth 2 (two) times daily., Disp: 14 tablet, Rfl: 0   gabapentin  (NEURONTIN ) 300 MG capsule, Take 1 capsule by mouth 3 (three) times daily., Disp: , Rfl:    ibuprofen (ADVIL) 600 MG tablet, Take 600 mg by mouth every 6 (six) hours as needed., Disp: , Rfl:    nitroGLYCERIN  (NITROSTAT ) 0.4 MG SL tablet, Place 1 tablet (0.4 mg total) under the tongue every 5 (five) minutes x 3 doses as needed for chest pain., Disp: 25 tablet, Rfl: 3   olmesartan  (BENICAR ) 40 MG tablet, Take 1 tablet (  40 mg total) by mouth daily., Disp: 90 tablet, Rfl: 3   tamsulosin  (FLOMAX ) 0.4 MG CAPS capsule, Take 0.8 mg by mouth daily., Disp: , Rfl:    testosterone  cypionate (DEPOTESTOSTERONE CYPIONATE) 200 MG/ML injection, Inject 0.5 mLs (100 mg total) into the muscle every 7 (seven) days., Disp: 10 mL, Rfl: 2   ticagrelor  (BRILINTA ) 90 MG TABS tablet, Take 1 tablet (90 mg total) by mouth 2 (two) times daily.,  Disp: 180 tablet, Rfl: 3   Objective:     Vitals:   06/17/24 1501  Pulse: (!) 107  SpO2: 98%  Weight: 183 lb (83 kg)  Height: 5' 5 (1.651 m)      Body mass index is 30.45 kg/m.    Physical Exam:    Neck Exam: Cervical Spine- Posture normal Skin- normal, intact  Neuro:  Strength-  Right Left   Deltoid (C5) 4/5 5/5  Bicep/Brachioradialis (C5/6) 3/5  5/5  Wrist Extension (C6) 3/5 5/5  Tricep (C7) 3/5 5/5  Wrist Flexion (C7) 3/5 5/5  Grip (C8) 3/5 5/5  Finger Abduction (T1) 4/5 5/5   Sensation: Decrease sensation to entirety of right upper extremity compared to left  Spurling's: Positive bilaterally Neck ROM: Reduced active ROM  TTP: cervical spinous processes, cervical paraspinal, thoracic paraspinal, trapezius    Electronically signed by:  Stephen [facility]ila [facility] 3:40 PM 06/17/2024

## 2024-06-17 ENCOUNTER — Ambulatory Visit

## 2024-06-17 ENCOUNTER — Ambulatory Visit: Admitting: Sports Medicine

## 2024-06-17 VITALS — HR 107 | Ht 65.0 in | Wt 183.0 lb

## 2024-06-17 DIAGNOSIS — R29898 Other symptoms and signs involving the musculoskeletal system: Secondary | ICD-10-CM | POA: Diagnosis not present

## 2024-06-17 DIAGNOSIS — G8929 Other chronic pain: Secondary | ICD-10-CM | POA: Diagnosis not present

## 2024-06-17 DIAGNOSIS — M542 Cervicalgia: Secondary | ICD-10-CM

## 2024-06-17 DIAGNOSIS — M25511 Pain in right shoulder: Secondary | ICD-10-CM | POA: Diagnosis not present

## 2024-06-17 DIAGNOSIS — R200 Anesthesia of skin: Secondary | ICD-10-CM

## 2024-06-17 DIAGNOSIS — M47812 Spondylosis without myelopathy or radiculopathy, cervical region: Secondary | ICD-10-CM | POA: Diagnosis not present

## 2024-06-17 DIAGNOSIS — R202 Paresthesia of skin: Secondary | ICD-10-CM

## 2024-06-17 MED ORDER — METHYLPREDNISOLONE 4 MG PO TBPK: ORAL_TABLET | ORAL | 0 refills | Status: DC

## 2024-06-17 NOTE — Patient Instructions (Addendum)
 Prednisone  dos pak   Xrays on the way out   MRI referral   Neurosurgery referral   Follow up 1 week after MRI in person or telemedicine

## 2024-06-26 ENCOUNTER — Inpatient Hospital Stay: Admission: RE | Admit: 2024-06-26 | Discharge: 2024-06-26 | Attending: Sports Medicine

## 2024-06-26 DIAGNOSIS — M542 Cervicalgia: Secondary | ICD-10-CM | POA: Diagnosis not present

## 2024-06-26 DIAGNOSIS — R200 Anesthesia of skin: Secondary | ICD-10-CM

## 2024-06-26 DIAGNOSIS — R29898 Other symptoms and signs involving the musculoskeletal system: Secondary | ICD-10-CM

## 2024-06-26 DIAGNOSIS — M5020 Other cervical disc displacement, unspecified cervical region: Secondary | ICD-10-CM | POA: Diagnosis not present

## 2024-06-26 DIAGNOSIS — M4802 Spinal stenosis, cervical region: Secondary | ICD-10-CM | POA: Diagnosis not present

## 2024-06-26 DIAGNOSIS — G8929 Other chronic pain: Secondary | ICD-10-CM

## 2024-06-26 DIAGNOSIS — M47812 Spondylosis without myelopathy or radiculopathy, cervical region: Secondary | ICD-10-CM | POA: Diagnosis not present

## 2024-07-29 ENCOUNTER — Telehealth (HOSPITAL_BASED_OUTPATIENT_CLINIC_OR_DEPARTMENT_OTHER): Payer: Self-pay | Admitting: *Deleted

## 2024-07-29 NOTE — Telephone Encounter (Signed)
 I s/w the pt about tele preop appt and pt tells me that he prefers in office. Pt states he has been having some chest pain occasionally and would like to get an EKG to be sure everything is alright. Pt has been seen to see Reche Finder, NP 08/10/24 @ 8:50 @ [facility]. Pt thanked me for the help.

## 2024-07-29 NOTE — Telephone Encounter (Signed)
"  ° °  Pre-operative Risk Assessment    Patient Name: Stephen Avila  DOB: 1975/12/01 MRN: [id number]   Date of last office visit: 11/10/23 DR. KELLY Date of next office visit: NONE   Request for Surgical Clearance    Procedure:  C3-4, C4-5, C5-6, C6-7 ANTERIOR CERVICAL FUSION  Date of Surgery:  Clearance TBD                                Surgeon:  DR. ARLEY HELLING Surgeon's Group or Practice Name:  [facility] Phone number:  [phone] Fax number:  [phone] ATTN: SHANDA EXT [id number]   Type of Clearance Requested:   - Medical  - Pharmacy:  Hold Ticagrelor  (Brilinta )     Type of Anesthesia:  General    Additional requests/questions:    Stephen Avila   07/29/2024, 3:36 PM   "

## 2024-07-29 NOTE — Telephone Encounter (Signed)
" ° °  Name: Stephen Avila  DOB: 01/19/1976  MRN: [id number]  Primary Cardiologist: Debby Sor, MD (Inactive)   Preoperative team, please contact this patient and set up a phone call appointment for further preoperative risk assessment. Please obtain consent and complete medication review. Thank you for your help.  I confirm that guidance regarding antiplatelet and oral anticoagulation therapy has been completed and, if necessary, noted below.  Per office protocol, he may hold Brilinta  for 5 days prior to procedure and should resume as soon as hemodynamically stable postoperatively.   I also confirmed the patient resides in the state of [place] . As per [facility] Medical Board telemedicine laws, the patient must reside in the state in which the provider is licensed.    Barnie Hila, NP 07/29/2024, 3:59 PM [facility]     "

## 2024-07-30 NOTE — Progress Notes (Unsigned)
 "  Referring Physician:  Leonce Katz, DO [address] [place],  [place] [address]  Primary Physician:  Rilla Baller, MD  History of Present Illness: 07/30/2024 Mr. Stephen Avila is here today with a chief complaint of ***  Neck pain Chronic right shoulder pain Right arm weakness Numbness and tingling of right arm  Duration: *** Location: *** Quality: *** Severity: ***  Precipitating: aggravated by *** Modifying factors: made better by *** Weakness: none Timing: *** Bowel/Bladder Dysfunction: none  Conservative measures:  Physical therapy: *** Has not participated in? Multimodal medical therapy including regular antiinflammatories: *** prednisone  Dosepak, gabapentin   Injections: *** epidural steroid injections?  Past Surgery: ***  AXEL FRISK has ***no symptoms of cervical myelopathy.  The symptoms are causing a significant impact on the patient's life.   Review of Systems:  A 10 point review of systems is negative, except for the pertinent positives and negatives detailed in the HPI.  Past Medical History: Past Medical History:  Diagnosis Date   Anxiety    Constipation    GERD (gastroesophageal reflux disease)    Heart murmur    History of chicken pox    Hypertension    Kidney stones    MI (mitral incompetence)    Prostate cancer (HCC)    PUD (peptic ulcer disease)     Past Surgical History: Past Surgical History:  Procedure Laterality Date   CHOLECYSTECTOMY N/A 06/26/2015   Procedure: LAPAROSCOPIC CHOLECYSTECTOMY;  Surgeon: Oneil Budge, MD;  Location: [facility];  Service: General;  Laterality: N/A;   COLONOSCOPY WITH PROPOFOL  N/A 05/07/2019   Procedure: COLONOSCOPY WITH PROPOFOL ;  Surgeon: Unk Corinn Skiff, MD;  Location: [facility] ENDOSCOPY;  Service: Gastroenterology;  Laterality: N/A;   CORONARY/GRAFT ACUTE MI REVASCULARIZATION N/A 11/07/2019   Procedure: Coronary/Graft Acute MI Revascularization;  Surgeon: Burnard Debby LABOR, MD;   Location: [facility] INVASIVE CV LAB;  Service: Cardiovascular;  Laterality: N/A;   ESOPHAGOGASTRODUODENOSCOPY N/A 05/07/2019   Procedure: ESOPHAGOGASTRODUODENOSCOPY (EGD);  Surgeon: Unk Corinn Skiff, MD;  Location: [facility] ENDOSCOPY;  Service: Gastroenterology;  Laterality: N/A;   HERNIA REPAIR  1979, 1981   x 2    LEFT HEART CATH AND CORONARY ANGIOGRAPHY N/A 11/07/2019   Procedure: LEFT HEART CATH AND CORONARY ANGIOGRAPHY;  Surgeon: Burnard Debby LABOR, MD;  Location: [facility] INVASIVE CV LAB;  Service: Cardiovascular;  Laterality: N/A;   LITHOTRIPSY     multiple    Allergies: Allergies as of 08/03/2024 - Review Complete 01/22/2024  Allergen Reaction Noted   Erythromycin   11/07/2019   Bactrim  [sulfamethoxazole -trimethoprim ] Other (See Comments) 01/14/2024    Medications: Outpatient Encounter Medications as of 08/03/2024  Medication Sig   atorvastatin  (LIPITOR ) 80 MG tablet TAKE 1 TABLET(80 MG) BY MOUTH DAILY   buprenorphine  (SUBUTEX ) 8 MG SUBL SL tablet Place 20 mg under the tongue daily.    carvedilol  (COREG ) 12.5 MG tablet Take 1 tablet (12.5 mg total) by mouth 2 (two) times daily.   clotrimazole -betamethasone  (LOTRISONE ) cream Apply 1 Application topically 2 (two) times daily.   doxycycline  (VIBRA -TABS) 100 MG tablet Take 1 tablet (100 mg total) by mouth 2 (two) times daily.   gabapentin  (NEURONTIN ) 300 MG capsule Take 1 capsule by mouth 3 (three) times daily.   ibuprofen (ADVIL) 600 MG tablet Take 600 mg by mouth every 6 (six) hours as needed.   methylPREDNISolone  (MEDROL  DOSEPAK) 4 MG TBPK tablet Take 6 tablets on day 1.  Take 5 tablets on day 2.  Take 4 tablets on day 3.  Take  3 tablets on day 4.  Take 2 tablets on day 5.  Take 1 tablet on day 6.   nitroGLYCERIN  (NITROSTAT ) 0.4 MG SL tablet Place 1 tablet (0.4 mg total) under the tongue every 5 (five) minutes x 3 doses as needed for chest pain.   olmesartan  (BENICAR ) 40 MG tablet Take 1 tablet (40 mg total) by mouth daily.   tamsulosin  (FLOMAX ) 0.4  MG CAPS capsule Take 0.8 mg by mouth daily.   testosterone  cypionate (DEPOTESTOSTERONE CYPIONATE) 200 MG/ML injection Inject 0.5 mLs (100 mg total) into the muscle every 7 (seven) days.   ticagrelor  (BRILINTA ) 90 MG TABS tablet Take 1 tablet (90 mg total) by mouth 2 (two) times daily.   No facility-administered encounter medications on file as of 08/03/2024.    Social History: Social History[1]  Family Medical History: Family History  Problem Relation Age of Onset   COPD Mother    Arthritis Mother    Asthma Mother    Lung cancer Mother        smoker   Early death Mother 39   Heart attack Mother    Heart disease Mother    Learning disabilities Mother    Miscarriages / Stillbirths Mother    Heart failure Father    Diabetes Father    Early death Father 24   Heart attack Father    Learning disabilities Father    Thyroid  disease Neg Hx     Physical Examination: @VITALWITHPAIN @  General: Patient is well developed, well nourished, calm, collected, and in no apparent distress. Attention to examination is appropriate.  Psychiatric: Patient is non-anxious.  Head:  Pupils equal, round, and reactive to light.  ENT:  Oral mucosa appears well hydrated.  Neck:   Supple.  ***Full range of motion.  Respiratory: Patient is breathing without any difficulty.  Extremities: No edema.  Vascular: Palpable dorsal pedal pulses.  Skin:   On exposed skin, there are no abnormal skin lesions.  NEUROLOGICAL:     Awake, alert, oriented to person, place, and time.  Speech is clear and fluent. Fund of knowledge is appropriate.   Cranial Nerves: Pupils equal round and reactive to light.  Facial tone is symmetric.  Facial sensation is symmetric.  ROM of spine: ***full.  Palpation of spine: ***non tender.    Strength: Side Biceps Triceps Deltoid Interossei Grip Wrist Ext. Wrist Flex.  R 5 5 5 5 5 5 5   L 5 5 5 5 5 5 5    Side Iliopsoas Quads Hamstring PF DF EHL  R 5 5 5 5 5 5   L 5 5 5 5 5 5     Reflexes are ***2+ and symmetric at the biceps, triceps, brachioradialis, patella and achilles.   Hoffman's is absent.  Clonus is not present.  Toes are down-going.  Bilateral upper and lower extremity sensation is intact to light touch.    Gait is normal.   No difficulty with tandem gait.   No evidence of dysmetria noted.  Medical Decision Making  Imaging: ***  I have personally reviewed the images and agree with the above interpretation.  Assessment and Plan: Mr. Lenderman is a pleasant 49 y.o. male with ***    Thank you for involving me in the care of this patient.   I spent a total of *** minutes in both face-to-face and non-face-to-face activities for this visit on the date of this encounter.   Lyle Decamp, PA-C Dept. of Neurosurgery      [1]  Social  History Tobacco Use   Smoking status: Never    Passive exposure: Never   Smokeless tobacco: Current    Types: Snuff    Last attempt to quit: 09/27/2020  Vaping Use   Vaping status: Never Used  Substance Use Topics   Alcohol use: Yes    Comment: occasional   Drug use: No   "

## 2024-08-03 ENCOUNTER — Ambulatory Visit: Admitting: Physician Assistant

## 2024-08-08 NOTE — Progress Notes (Unsigned)
 error

## 2024-08-10 ENCOUNTER — Ambulatory Visit (INDEPENDENT_AMBULATORY_CARE_PROVIDER_SITE_OTHER): Admitting: Family

## 2024-08-10 ENCOUNTER — Encounter (HOSPITAL_BASED_OUTPATIENT_CLINIC_OR_DEPARTMENT_OTHER): Payer: Self-pay | Admitting: Family

## 2024-08-10 VITALS — BP 128/66 | HR 99 | Ht 66.0 in | Wt 191.9 lb

## 2024-08-10 DIAGNOSIS — I25118 Atherosclerotic heart disease of native coronary artery with other forms of angina pectoris: Secondary | ICD-10-CM

## 2024-08-10 DIAGNOSIS — E785 Hyperlipidemia, unspecified: Secondary | ICD-10-CM

## 2024-08-10 DIAGNOSIS — Z01810 Encounter for preprocedural cardiovascular examination: Secondary | ICD-10-CM | POA: Diagnosis not present

## 2024-08-10 DIAGNOSIS — I252 Old myocardial infarction: Secondary | ICD-10-CM | POA: Diagnosis not present

## 2024-08-10 DIAGNOSIS — I10 Essential (primary) hypertension: Secondary | ICD-10-CM | POA: Diagnosis not present

## 2024-08-10 MED ORDER — TICAGRELOR 60 MG PO TABS: 60.0000 mg | ORAL_TABLET | Freq: Two times a day (BID) | ORAL | 3 refills | Status: AC

## 2024-08-10 MED ORDER — OLMESARTAN MEDOXOMIL 40 MG PO TABS: 40.0000 mg | ORAL_TABLET | Freq: Every day | ORAL | 2 refills | Status: AC

## 2024-08-10 MED ORDER — NITROGLYCERIN 0.4 MG SL SUBL: 0.4000 mg | SUBLINGUAL_TABLET | SUBLINGUAL | 3 refills | Status: AC | PRN

## 2024-08-10 MED ORDER — CARVEDILOL 12.5 MG PO TABS: 12.5000 mg | ORAL_TABLET | Freq: Two times a day (BID) | ORAL | 2 refills | Status: AC

## 2024-08-10 MED ORDER — ATORVASTATIN CALCIUM 80 MG PO TABS: 80.0000 mg | ORAL_TABLET | Freq: Every day | ORAL | 3 refills | Status: AC

## 2024-08-13 ENCOUNTER — Other Ambulatory Visit (HOSPITAL_COMMUNITY): Payer: Self-pay

## 2024-08-13 ENCOUNTER — Telehealth: Payer: Self-pay | Admitting: Pharmacy Technician

## 2024-08-13 NOTE — Telephone Encounter (Signed)
" ° °  Insurance will pay for generic   Notified pharmacy "

## 2024-09-06 ENCOUNTER — Other Ambulatory Visit

## 2024-09-15 ENCOUNTER — Ambulatory Visit: Admitting: Urology
